# Patient Record
Sex: Female | Born: 1985
Health system: Southern US, Community
[De-identification: ages and names within clinical notes are randomized; demographics above are authoritative.]

## PROBLEM LIST (undated history)

## (undated) DIAGNOSIS — R519 Headache, unspecified: Secondary | ICD-10-CM

## (undated) DIAGNOSIS — R0602 Shortness of breath: Secondary | ICD-10-CM

## (undated) DIAGNOSIS — R200 Anesthesia of skin: Secondary | ICD-10-CM

## (undated) DIAGNOSIS — K589 Irritable bowel syndrome without diarrhea: Secondary | ICD-10-CM

## (undated) DIAGNOSIS — R600 Localized edema: Secondary | ICD-10-CM

## (undated) DIAGNOSIS — G43909 Migraine, unspecified, not intractable, without status migrainosus: Secondary | ICD-10-CM

## (undated) DIAGNOSIS — R079 Chest pain, unspecified: Secondary | ICD-10-CM

## (undated) DIAGNOSIS — K219 Gastro-esophageal reflux disease without esophagitis: Secondary | ICD-10-CM

## (undated) DIAGNOSIS — J45909 Unspecified asthma, uncomplicated: Secondary | ICD-10-CM

## (undated) DIAGNOSIS — M549 Dorsalgia, unspecified: Secondary | ICD-10-CM

## (undated) DIAGNOSIS — I10 Essential (primary) hypertension: Secondary | ICD-10-CM

## (undated) DIAGNOSIS — E669 Obesity, unspecified: Secondary | ICD-10-CM

## (undated) DIAGNOSIS — M2550 Pain in unspecified joint: Secondary | ICD-10-CM

## (undated) DIAGNOSIS — R7303 Prediabetes: Secondary | ICD-10-CM

## (undated) HISTORY — DX: Shortness of breath: R06.02

## (undated) HISTORY — DX: Dorsalgia, unspecified: M54.9

## (undated) HISTORY — PX: MOUTH SURGERY: SHX715

## (undated) HISTORY — DX: Headache, unspecified: R51.9

## (undated) HISTORY — DX: Migraine, unspecified, not intractable, without status migrainosus: G43.909

## (undated) HISTORY — DX: Localized edema: R60.0

## (undated) HISTORY — DX: Chest pain, unspecified: R07.9

## (undated) HISTORY — DX: Gastro-esophageal reflux disease without esophagitis: K21.9

## (undated) HISTORY — DX: Anesthesia of skin: R20.0

## (undated) HISTORY — DX: Pain in unspecified joint: M25.50

## (undated) HISTORY — DX: Unspecified asthma, uncomplicated: J45.909

## (undated) HISTORY — DX: Essential (primary) hypertension: I10

---

## 2011-05-18 ENCOUNTER — Other Ambulatory Visit: Payer: Self-pay

## 2011-05-18 ENCOUNTER — Encounter: Payer: Self-pay | Admitting: *Deleted

## 2011-05-18 ENCOUNTER — Emergency Department (HOSPITAL_COMMUNITY)
Admission: EM | Admit: 2011-05-18 | Discharge: 2011-05-19 | Disposition: A | Discharge status: Home / Self Care | Payer: Self-pay | Attending: Emergency Medicine | Admitting: Emergency Medicine

## 2011-05-18 DIAGNOSIS — J40 Bronchitis, not specified as acute or chronic: Secondary | ICD-10-CM | POA: Insufficient documentation

## 2011-05-18 DIAGNOSIS — R0789 Other chest pain: Secondary | ICD-10-CM | POA: Insufficient documentation

## 2011-05-18 DIAGNOSIS — J45909 Unspecified asthma, uncomplicated: Secondary | ICD-10-CM | POA: Insufficient documentation

## 2011-05-18 DIAGNOSIS — R059 Cough, unspecified: Secondary | ICD-10-CM | POA: Insufficient documentation

## 2011-05-18 DIAGNOSIS — R05 Cough: Secondary | ICD-10-CM | POA: Insufficient documentation

## 2011-05-18 NOTE — ED Notes (Signed)
The pt has had mid-upper chest pain  Since this am when she woke up.  She has a history of asthma and she is a smoker..  Coughing up mucous intermittently

## 2011-05-19 ENCOUNTER — Emergency Department (HOSPITAL_COMMUNITY): Payer: Self-pay

## 2011-05-19 MED ORDER — IBUPROFEN 800 MG PO TABS
800.0000 mg | ORAL_TABLET | Freq: Once | ORAL | Status: AC
  Administered 2011-05-19: 800 mg via ORAL
  Filled 2011-05-19: qty 1

## 2011-05-19 MED ORDER — IBUPROFEN 800 MG PO TABS: 800.0000 mg | ORAL_TABLET | Freq: Three times a day (TID) | ORAL | Status: AC

## 2011-05-19 MED ORDER — PREDNISONE 20 MG PO TABS
50.0000 mg | ORAL_TABLET | Freq: Once | ORAL | Status: AC
  Administered 2011-05-19: 50 mg via ORAL
  Filled 2011-05-19: qty 3

## 2011-05-19 MED ORDER — ALBUTEROL SULFATE HFA 108 (90 BASE) MCG/ACT IN AERS
2.0000 | INHALATION_SPRAY | Freq: Four times a day (QID) | RESPIRATORY_TRACT | Status: DC
  Administered 2011-05-19: 2 via RESPIRATORY_TRACT
  Filled 2011-05-19: qty 6.7

## 2011-05-19 NOTE — ED Provider Notes (Addendum)
History     CSN: [id number] Arrival date & time: 05/18/2011  9:55 PM   None     Chief Complaint  Patient presents with  . Chest Pain   Pleasant 25 year old female with a known history of childhood asthma. She, states she's been having pain in her upper chest, mostly with coughing or with certain positions and movement of her upper torso. She states she is "wheezing a lot." But does not have an inhaler. She has had no leg pain or swelling. Denies any fevers. Her cough is mostly nonproductive. She has had no pleuritic pain. No pain with exertion. She does not take birth control pills. Patient did not take any medications at home. Other than Excedrin. No history of cardiac disease or any strong family history of cardiac disease (Consider location/radiation/quality/duration/timing/severity/associated sxs/prior treatment) HPI  Past Medical History  Diagnosis Date  . Asthma     History reviewed. No pertinent past surgical history.  History reviewed. No pertinent family history.  History  Substance Use Topics  . Smoking status: Current Everyday Smoker  . Smokeless tobacco: Not on file  . Alcohol Use: No    OB History    Grav Para Term Preterm Abortions TAB SAB Ect Mult Living                  Review of Systems  All other systems reviewed and are negative.    Allergies  Review of patient's allergies indicates no known allergies.  Home Medications   Current Outpatient Rx  Name Route Sig Dispense Refill  . ASPIRIN-ACETAMINOPHEN-CAFFEINE 250-250-65 MG PO TABS Oral Take 1 tablet by mouth every 6 (six) hours as needed. For migraine       BP 122/77  Pulse 85  Temp(Src) 98.2 F (36.8 C) (Oral)  Resp 24  SpO2 100%  LMP 05/01/2011  Physical Exam  Constitutional: She is oriented to person, place, and time. She appears well-developed and well-nourished.  HENT:  Head: Normocephalic and atraumatic.  Eyes: Conjunctivae and EOM are normal. Pupils are equal, round, and  reactive to light.  Neck: Neck supple.  Cardiovascular: Normal rate and regular rhythm.  Exam reveals no gallop and no friction rub.   No murmur heard. Pulmonary/Chest: Breath sounds normal. She has no wheezes. She has no rales. She exhibits no tenderness.       Patient has diffuse reproducible upper chest wall tenderness. No redness or swelling. No crepitus.  Abdominal: Soft. Bowel sounds are normal. She exhibits no distension. There is no tenderness. There is no rebound and no guarding.  Musculoskeletal: Normal range of motion.  Neurological: She is alert and oriented to person, place, and time. No cranial nerve deficit. Coordination normal.  Skin: Skin is warm and dry. No rash noted.  Psychiatric: She has a normal mood and affect.    ED Course  Procedures (including critical care time)  Labs Reviewed - No data to display No results found.   No diagnosis found.    MDM  Patient is not in room, currently in x-ray. Will recheck momentarily       Jacqueline Delapena A. Patrica Duel, MD 05/19/11 0016  No results found for this or any previous visit. Dg Chest 2 View  05/19/2011  *RADIOLOGY REPORT*  Clinical Data: 25 year old female with chest pain.  CHEST - 2 VIEW  Comparison: None.  Findings: Somewhat shallow lung volumes.  Cardiac size and mediastinal contours are within normal limits.  Visualized tracheal air column is within normal limits.  No  pneumothorax, pleural effusion or confluent pulmonary opacity.  Questionable increased interstitial markings diffusely. No osseous abnormality identified. Negative visualized bowel gas pattern.  IMPRESSION: Negative except for suggestion of mild increased interstitial markings diffusely, probably artifact due to technique in the absence of symptoms of respiratory infection.  Original Report Authenticated By: Harley Hallmark, M.D.      Raed Schalk A. Patrica Duel, MD 05/19/11 0130

## 2011-05-19 NOTE — ED Notes (Signed)
Pt to ED c/o chest pain r/t cough.  Pt states sternal pain after coughing since yesterday.  Lung sounds clear bil.

## 2012-03-19 ENCOUNTER — Emergency Department (INDEPENDENT_AMBULATORY_CARE_PROVIDER_SITE_OTHER)
Admission: EM | Admit: 2012-03-19 | Discharge: 2012-03-19 | Disposition: A | Discharge status: Home / Self Care | Payer: Self-pay | Source: Home / Self Care | Attending: Family Medicine | Admitting: Family Medicine

## 2012-03-19 ENCOUNTER — Encounter (HOSPITAL_COMMUNITY): Payer: Self-pay | Admitting: Emergency Medicine

## 2012-03-19 DIAGNOSIS — R200 Anesthesia of skin: Secondary | ICD-10-CM

## 2012-03-19 DIAGNOSIS — R209 Unspecified disturbances of skin sensation: Secondary | ICD-10-CM

## 2012-03-19 NOTE — ED Provider Notes (Signed)
History     CSN: [id number]  Arrival date & time 03/19/12  1704   None     Chief Complaint  Patient presents with  . Numbness    (Consider location/radiation/quality/duration/timing/severity/associated sxs/prior treatment) The history is provided by the patient.  Patient reports known history of migraine headaches for which she takes OTC excedrin with resolution of symptoms.  Migraines are described as bilateral to unilateral triggered by smell.  States has not seek medical attention for symptoms in past because they are controlled at home.  Today presents for persistent left facial numbness noted after awakening 5 mornings ago.  Unknown if migraine symptoms prior to going to bed the previous night.  Denies associated migraines since facial numbness onset.  No other associated symptoms, no change in vision, no muscle weakness, no change in ability to swallow.  Denies history of same.    Past Medical History  Diagnosis Date  . Asthma     History reviewed. No pertinent past surgical history.  No family history on file.  History  Substance Use Topics  . Smoking status: Current Every Day Smoker  . Smokeless tobacco: Not on file  . Alcohol Use: Yes    OB History    Grav Para Term Preterm Abortions TAB SAB Ect Mult Living                  Review of Systems  Constitutional: Negative.   HENT: Positive for sinus pressure. Negative for facial swelling, mouth sores, trouble swallowing, neck pain and dental problem.   Respiratory: Negative.   Cardiovascular: Negative.   Skin: Negative.   Neurological: Positive for numbness and headaches. Negative for dizziness, tremors, seizures, syncope, facial asymmetry, speech difficulty, weakness and light-headedness.  Psychiatric/Behavioral: Negative.     Allergies  Review of patient's allergies indicates no known allergies.  Home Medications   Current Outpatient Rx  Name Route Sig Dispense Refill  . ASPIRIN-ACETAMINOPHEN-CAFFEINE  250-250-65 MG PO TABS Oral Take 1 tablet by mouth every 6 (six) hours as needed. For migraine       BP 117/79  Pulse 66  Temp 98.4 F (36.9 C) (Oral)  Resp 12  SpO2 98%  LMP 02/17/2012  Physical Exam  Nursing note and vitals reviewed. Constitutional: She is oriented to person, place, and time. Vital signs are normal. She appears well-developed and well-nourished. She is active and cooperative.  HENT:  Head: Normocephalic.    Right Ear: Hearing, tympanic membrane, external ear and ear canal normal.  Left Ear: Hearing, tympanic membrane, external ear and ear canal normal.  Nose: Right sinus exhibits no maxillary sinus tenderness. Left sinus exhibits maxillary sinus tenderness.  Mouth/Throat: Uvula is midline, oropharynx is clear and moist and mucous membranes are normal.       Tenderness and decreased sensation.  Eyes: Conjunctivae normal and EOM are normal. Pupils are equal, round, and reactive to light. No scleral icterus.  Neck: Trachea normal, normal range of motion and full passive range of motion without pain. Neck supple. No hepatojugular reflux present. No spinous process tenderness and no muscular tenderness present. No Brudzinski's sign noted.  Cardiovascular: Normal rate, regular rhythm, normal heart sounds, intact distal pulses and normal pulses.   Pulmonary/Chest: Effort normal and breath sounds normal.  Neurological: She is alert and oriented to person, place, and time. She has normal strength and normal reflexes. No cranial nerve deficit or sensory deficit. Coordination and gait normal. GCS eye subscore is 4. GCS verbal subscore is 5. GCS  motor subscore is 6.       Equal hand grips, sensation intact distally.  Skin: Skin is warm and dry.  Psychiatric: She has a normal mood and affect. Her speech is normal and behavior is normal. Judgment and thought content normal. Cognition and memory are normal.    ED Course  Procedures (including critical care time)  Labs Reviewed  - No data to display No results found.   1. Left facial numbness       MDM  Discussed with Dr. Shela Commons. Kindl.  Patient instructed to follow up with Dr. Vela Prose or headache clinic this week for further evaluation and treatment.          Johnsie Kindred, NP 03/25/12 1055

## 2012-03-19 NOTE — ED Notes (Signed)
Reports facial numbness to left side of face.  Numbness involving upper lip, bordered by nose and eye.  Reports numbness for 5 days.  Denies uri symptoms.  Reports headaches "all the time" including today, but has not taken any medicine for this.

## 2012-03-29 NOTE — ED Provider Notes (Signed)
Medical screening examination/treatment/procedure(s) were performed by resident physician or non-physician practitioner and as supervising physician I was immediately available for consultation/collaboration.   Kayode Petion DOUGLAS MD.    Charnay Nazario D Cristal Howatt, MD 03/29/12 0828 

## 2013-08-08 ENCOUNTER — Other Ambulatory Visit (HOSPITAL_COMMUNITY): Payer: Self-pay | Admitting: *Deleted

## 2013-08-08 DIAGNOSIS — N6320 Unspecified lump in the left breast, unspecified quadrant: Secondary | ICD-10-CM

## 2013-08-12 ENCOUNTER — Ambulatory Visit (HOSPITAL_COMMUNITY)
Admission: RE | Admit: 2013-08-12 | Discharge: 2013-08-12 | Disposition: A | Discharge status: Home / Self Care | Payer: No Typology Code available for payment source | Source: Ambulatory Visit | Attending: Obstetrics and Gynecology | Admitting: Obstetrics and Gynecology

## 2013-08-12 ENCOUNTER — Encounter (INDEPENDENT_AMBULATORY_CARE_PROVIDER_SITE_OTHER): Payer: Self-pay

## 2013-08-12 ENCOUNTER — Encounter (HOSPITAL_COMMUNITY): Payer: Self-pay

## 2013-08-12 ENCOUNTER — Ambulatory Visit
Admission: RE | Admit: 2013-08-12 | Discharge: 2013-08-12 | Disposition: A | Discharge status: Home / Self Care | Payer: No Typology Code available for payment source | Source: Ambulatory Visit | Attending: Obstetrics and Gynecology | Admitting: Obstetrics and Gynecology

## 2013-08-12 VITALS — BP 114/78 | Temp 97.8°F | Ht 64.0 in | Wt 324.0 lb

## 2013-08-12 DIAGNOSIS — R2230 Localized swelling, mass and lump, unspecified upper limb: Secondary | ICD-10-CM

## 2013-08-12 DIAGNOSIS — N6320 Unspecified lump in the left breast, unspecified quadrant: Secondary | ICD-10-CM

## 2013-08-12 DIAGNOSIS — Z01419 Encounter for gynecological examination (general) (routine) without abnormal findings: Secondary | ICD-10-CM

## 2013-08-12 HISTORY — DX: Localized swelling, mass and lump, unspecified upper limb: R22.30

## 2013-08-12 NOTE — Patient Instructions (Signed)
Taught Lynnette CaffeyKendra Lofaro how to perform BSE and gave educational materials to take home. Let her know [facility] will cover Pap smears every 3 years unless has a history of abnormal Pap smears. [facility] for left breast ultrasound. Appointment scheduled for Tuesday, August 12, 2013 at 0945. Patient aware of appointment and will be there. Let patient know will follow up with her within the next couple weeks with results of Pap smear by phone. Smoking cessation discussed with patient. Lynnette CaffeyKendra Mccance verbalized understanding.  Soua Caltagirone, Kathaleen Maserhristine Poll, RN 8:22 AM

## 2013-08-12 NOTE — Progress Notes (Addendum)
Complaints of a left axillary lump x 2 weeks that patient states has decreased in size. Patient states was red and painful when touched.  Pap Smear:  Pap smear completed today. Patients last Pap smear was in 2008 at [facility] in [place] and normal per patient. Per patient has no history of an abnormal Pap smear. No Pap smear results in EPIC.  Physical exam: Breasts Breasts symmetrical. No skin abnormalities bilateral breasts. No nipple retraction bilateral breasts. No nipple discharge bilateral breasts. No lymphadenopathy. No lumps palpated bilateral breasts. Palpated a small pea sized lump within the center of the left axilla. Patient complained of tenderness when palpated lump within the left axilla. [facility] for left breast ultrasound. Appointment scheduled for Tuesday, August 12, 2013 at 0945.          Pelvic/Bimanual   Ext Genitalia No lesions, no swelling and no discharge observed on external genitalia.         Vagina Vagina pink and normal texture. No lesions or discharge observed in vagina.          Cervix Cervix is present. Cervix pink and of normal texture. Cervix friable. No discharge observed.     Uterus Uterus is present and palpable. Uterus in normal position and normal size.        Adnexae Bilateral ovaries present and palpable. No tenderness on palpation.          Rectovaginal No rectal exam completed today since patient had no rectal complaints. No skin abnormalities observed on exam.

## 2013-09-03 ENCOUNTER — Telehealth (HOSPITAL_COMMUNITY): Payer: Self-pay | Admitting: *Deleted

## 2013-09-03 NOTE — Telephone Encounter (Signed)
Telephoned patient at home # and discussed negative pap smear results. Next pap smear due in 3 years. Patient voiced understanding.  

## 2014-11-23 ENCOUNTER — Encounter (HOSPITAL_COMMUNITY): Payer: Self-pay | Admitting: *Deleted

## 2014-11-23 ENCOUNTER — Emergency Department (HOSPITAL_COMMUNITY)
Admission: EM | Admit: 2014-11-23 | Discharge: 2014-11-23 | Disposition: A | Discharge status: Home / Self Care | Payer: Self-pay | Attending: Emergency Medicine | Admitting: Emergency Medicine

## 2014-11-23 DIAGNOSIS — G5601 Carpal tunnel syndrome, right upper limb: Secondary | ICD-10-CM

## 2014-11-23 DIAGNOSIS — J45909 Unspecified asthma, uncomplicated: Secondary | ICD-10-CM | POA: Insufficient documentation

## 2014-11-23 DIAGNOSIS — R51 Headache: Secondary | ICD-10-CM | POA: Insufficient documentation

## 2014-11-23 DIAGNOSIS — K1379 Other lesions of oral mucosa: Secondary | ICD-10-CM | POA: Insufficient documentation

## 2014-11-23 DIAGNOSIS — L309 Dermatitis, unspecified: Secondary | ICD-10-CM

## 2014-11-23 DIAGNOSIS — K1370 Unspecified lesions of oral mucosa: Secondary | ICD-10-CM

## 2014-11-23 DIAGNOSIS — Z720 Tobacco use: Secondary | ICD-10-CM | POA: Insufficient documentation

## 2014-11-23 MED ORDER — HYDROCORTISONE 2.5 % EX LOTN
TOPICAL_LOTION | Freq: Two times a day (BID) | CUTANEOUS | Status: DC
Start: 2014-11-23 — End: 2015-08-24

## 2014-11-23 MED ORDER — IBUPROFEN 800 MG PO TABS: 800.0000 mg | ORAL_TABLET | Freq: Three times a day (TID) | ORAL | Status: DC

## 2014-11-23 NOTE — ED Provider Notes (Signed)
CSN: [id number]     Arrival date & time 11/23/14  4098 History  This chart was scribed for non-physician practitioner, Santiago Glad, PA-C, working with Richardean Canal, MD, by Ronney Lion, ED Scribe. This patient was seen in room TR06C/TR06C and the patient's care was started at 9:20 AM.    Chief Complaint  Patient presents with  . Rash    RT hand  . Mouth Lesions   The history is provided by the patient. No language interpreter was used.     HPI Comments: Jacqueline Collins is a 29 y.o. female who presents to the Emergency Department complaining of a pruritic rash on her right hand that has been intermittent for 4 months. The rash started after patient had cut her right hand on a nail at work and had applied peroxide and alcohol on the cut, which healed without issue.  She also complains of right wrist "stiffness" and numbness through her right fingers. She had used hand sanitizer, which aggravated the rash and made it burn. Nothing makes it better. She denies fever, chills, headache, or SOB.   Patient also complains of a bump on the left inside of her mouth that has been present for a year. She has not yet had the bump evaluated or treated it. Nothing makes it better and nothing makes it worse. She states she is going to the dentist soon and will have it checked out. She denies smokeless tobacco use. She states she had "pulled out" the bump at one point and "busted it," but it soon returned.  She does report that she currently smokes cigarettes, but denies chewing tobacco.   Past Medical History  Diagnosis Date  . Asthma    History reviewed. No pertinent past surgical history. Family History  Problem Relation Age of Onset  . Hypertension Mother   . Diabetes Mother   . Breast cancer Maternal Aunt    History  Substance Use Topics  . Smoking status: Current Every Day Smoker -- 0.25 packs/day for 12 years  . Smokeless tobacco: Not on file  . Alcohol Use: No   OB History    Gravida Para Term  Preterm AB TAB SAB Ectopic Multiple Living   0              Review of Systems  Constitutional: Negative for fever and chills.  HENT: Positive for mouth sores.   Respiratory: Negative for shortness of breath.   Skin: Positive for rash.  Neurological: Positive for headaches.    Allergies  Review of patient's allergies indicates no known allergies.  Home Medications   Prior to Admission medications   Medication Sig Start Date End Date Taking? Authorizing Provider  aspirin-acetaminophen-caffeine (EXCEDRIN MIGRAINE) [phone] MG per tablet Take 1 tablet by mouth every 6 (six) hours as needed. For migraine     Historical Provider, MD   BP 120/107 mmHg  Pulse 76  Temp(Src) 97.8 F (36.6 C) (Oral)  Resp 18  SpO2 100%  LMP 10/24/2014 Physical Exam  Constitutional: She is oriented to person, place, and time. She appears well-developed and well-nourished. No distress.  HENT:  Head: Normocephalic and atraumatic.  Approximately 3 mm soft raised circular lesion of the left lower gingiva.  No fluctuance.  No drainage.  Eyes: Conjunctivae and EOM are normal.  Neck: Neck supple. No tracheal deviation present.  Cardiovascular: Normal rate, regular rhythm and normal heart sounds.   Pulmonary/Chest: Effort normal and breath sounds normal.  Musculoskeletal: Normal range of motion.  Positive Phalen's. Positive Tinel's sign.   Neurological: She is alert and oriented to person, place, and time.  Skin: Skin is warm and dry. Rash noted.  Small 1 cm area of faint papules located on the dorsal aspect of the right hand just proximal to the second MCP. No drainage.  Psychiatric: She has a normal mood and affect. Her behavior is normal.  Nursing note and vitals reviewed.   ED Course  Procedures (including critical care time)  DIAGNOSTIC STUDIES: Oxygen Saturation is 100% on RA, normal by my interpretation.    COORDINATION OF CARE: 9:31 AM - Suspect contact dermatitis and carpal tunnel.  Discussed treatment plan with pt at bedside which includes steroid cream, wrist splint and anti-inflammatory medication, and referral to oral surgeon; pt agreed to plan.    Meds ordered this encounter  Medications  . ibuprofen (ADVIL,MOTRIN) 800 MG tablet    Sig: Take 1 tablet (800 mg total) by mouth 3 (three) times daily.    Dispense:  21 tablet    Refill:  0    Order Specific Question:  Supervising Provider    Answer:  MILLER, BRIAN [[id number]]  . hydrocortisone 2.5 % lotion    Sig: Apply topically 2 (two) times daily.    Dispense:  59 mL    Refill:  0    Order Specific Question:  Supervising Provider    Answer:  Eber HongMILLER, BRIAN [[id number]]     MDM   Final diagnoses:  None  Patient presents today with a rash on the hand that has been intermittent for the past 4 months.  Patient instructed to apply Hydrocortisone to the area.  No signs of Anaphylaxis.  Patient also complaining of an oral lesion that has been present for the past year.  No signs of dental abscess or infection.  Patient given referral to Oral Surgery regarding this.  Patient also with pain of her wrist with intermittent numbness/tingling of her fingers.  Symptoms most consistent with Carpal Tunnel.  Patient given a wrist splint.  Stable for discharge.  Return precautions given.  I personally performed the services described in this documentation, which was scribed in my presence. The recorded information has been reviewed and is accurate.     Santiago GladHeather Cohl Behrens, PA-C 11/23/14 1353  Richardean Canalavid H Yao, MD 11/23/14 [phone]

## 2014-11-23 NOTE — ED Notes (Signed)
Pt reports one month ago she hit Rt hand on a nail and has had a rash on RT hand since. Pt also reports a bump on the LT inside of mouth.

## 2014-11-23 NOTE — ED Notes (Signed)
Declined W/C at D/C and was escorted to lobby by RN. 

## 2015-06-14 ENCOUNTER — Emergency Department (HOSPITAL_COMMUNITY)
Admission: EM | Admit: 2015-06-14 | Discharge: 2015-06-14 | Disposition: A | Discharge status: Home / Self Care | Payer: Self-pay | Attending: Emergency Medicine | Admitting: Emergency Medicine

## 2015-06-14 ENCOUNTER — Encounter (HOSPITAL_COMMUNITY): Payer: Self-pay | Admitting: Emergency Medicine

## 2015-06-14 ENCOUNTER — Emergency Department (HOSPITAL_COMMUNITY): Payer: Self-pay

## 2015-06-14 DIAGNOSIS — M25531 Pain in right wrist: Secondary | ICD-10-CM | POA: Insufficient documentation

## 2015-06-14 DIAGNOSIS — M79644 Pain in right finger(s): Secondary | ICD-10-CM | POA: Insufficient documentation

## 2015-06-14 DIAGNOSIS — R200 Anesthesia of skin: Secondary | ICD-10-CM | POA: Insufficient documentation

## 2015-06-14 DIAGNOSIS — E669 Obesity, unspecified: Secondary | ICD-10-CM | POA: Insufficient documentation

## 2015-06-14 DIAGNOSIS — Z23 Encounter for immunization: Secondary | ICD-10-CM | POA: Insufficient documentation

## 2015-06-14 DIAGNOSIS — F17200 Nicotine dependence, unspecified, uncomplicated: Secondary | ICD-10-CM | POA: Insufficient documentation

## 2015-06-14 DIAGNOSIS — J45909 Unspecified asthma, uncomplicated: Secondary | ICD-10-CM | POA: Insufficient documentation

## 2015-06-14 DIAGNOSIS — Z7982 Long term (current) use of aspirin: Secondary | ICD-10-CM | POA: Insufficient documentation

## 2015-06-14 DIAGNOSIS — M79645 Pain in left finger(s): Secondary | ICD-10-CM | POA: Insufficient documentation

## 2015-06-14 DIAGNOSIS — M25532 Pain in left wrist: Secondary | ICD-10-CM | POA: Insufficient documentation

## 2015-06-14 HISTORY — DX: Obesity, unspecified: E66.9

## 2015-06-14 MED ORDER — POLYETHYLENE GLYCOL 3350 17 G PO PACK
17.0000 g | PACK | Freq: Once | ORAL | Status: DC
  Filled 2015-06-14: qty 1

## 2015-06-14 MED ORDER — TETANUS-DIPHTH-ACELL PERTUSSIS 5-2.5-18.5 LF-MCG/0.5 IM SUSP
0.5000 mL | Freq: Once | INTRAMUSCULAR | Status: AC
  Administered 2015-06-14: 0.5 mL via INTRAMUSCULAR
  Filled 2015-06-14: qty 0.5

## 2015-06-14 MED ORDER — NAPROXEN 250 MG PO TABS
500.0000 mg | ORAL_TABLET | Freq: Once | ORAL | Status: AC
  Administered 2015-06-14: 500 mg via ORAL
  Filled 2015-06-14: qty 2

## 2015-06-14 MED ORDER — NAPROXEN 500 MG PO TABS: 500.0000 mg | ORAL_TABLET | Freq: Two times a day (BID) | ORAL | Status: DC

## 2015-06-14 NOTE — ED Provider Notes (Signed)
CSN: [id number]     Arrival date & time 06/14/15  2058 History  By signing my name below, I, Jacqueline Collins, attest that this documentation has been prepared under the direction and in the presence of [employer], PA-C. Electronically Signed: Tanda Collins, ED Scribe. 06/14/2015. 10:10 PM.     No chief complaint on file.  The history is provided by the patient. No language interpreter was used.     HPI Comments: Jacqueline Collins is a 29 y.o. female who is right hand dominant presents to the Emergency Department complaining of gradual onset, constant, sharp, right thumb and index finger pain and numbness x 1 week. Pt also complains of bilateral wrist pain, worse on left. Pt works at [employer] on an [employer] and does a lot of repetitive work. The pain is worse after waking up. She reports that the pain is mildly alleviated with popping of her fingers. Denies fever, chills, neck pain, or any other associated symptoms. Pt is also requesting a tetanus shot at this time her previous cut. She reports that she cut her middle finger on her right hand about 1 month ago. Pt was seen at that time but did not receive a tetanus and would like one.   Past Medical History  Diagnosis Date  . Asthma   . Obesity    History reviewed. No pertinent past surgical history. Family History  Problem Relation Age of Onset  . Hypertension Mother   . Diabetes Mother   . Breast cancer Maternal Aunt    Social History  Substance Use Topics  . Smoking status: Current Every Day Smoker -- 0.25 packs/day for 12 years  . Smokeless tobacco: None  . Alcohol Use: No   OB History    Gravida Para Term Preterm AB TAB SAB Ectopic Multiple Living   0              Review of Systems  Constitutional: Negative for fever and chills.  Musculoskeletal: Positive for arthralgias (Bilateral index and thumb pain. Bilateral wrist pain. ). Negative for neck pain.  Neurological: Positive for numbness.    Allergies  Review of  patient's allergies indicates no known allergies.  Home Medications   Prior to Admission medications   Medication Sig Start Date End Date Taking? Authorizing Provider  aspirin-acetaminophen-caffeine (EXCEDRIN MIGRAINE) [phone] MG per tablet Take 1 tablet by mouth every 6 (six) hours as needed. For migraine    Yes Historical Provider, MD  hydrocortisone 2.5 % lotion Apply topically 2 (two) times daily. Patient not taking: Reported on 06/14/2015 11/23/14   Santiago Glad, PA-C  ibuprofen (ADVIL,MOTRIN) 800 MG tablet Take 1 tablet (800 mg total) by mouth 3 (three) times daily. Patient not taking: Reported on 06/14/2015 11/23/14   Santiago Glad, PA-C  naproxen (NAPROSYN) 500 MG tablet Take 1 tablet (500 mg total) by mouth 2 (two) times daily with a meal. 06/14/15   Catha Gosselin, PA-C   Triage Vitals: BP 133/91 mmHg  Pulse 80  Temp(Src) 97.6 F (36.4 C) (Oral)  Resp 18  Ht  (1.626 m)  Wt 274 lb 9 oz (124.541 kg)  BMI 47.11 kg/m2  SpO2 99%  LMP 05/30/2015 (Approximate)   Physical Exam  Constitutional: She is oriented to person, place, and time. She appears well-developed and well-nourished. No distress.  HENT:  Head: Normocephalic and atraumatic.  Eyes: Conjunctivae and EOM are normal.  Neck: Neck supple. No tracheal deviation present.  Cardiovascular: Normal rate.   Pulmonary/Chest: Effort normal. No  respiratory distress.  Musculoskeletal: Normal range of motion.  Scar along the MCP of the middle finger on right hand Able to flex and extend finger 2+ radial pulse Numbness along the bilateral thumb and partial first finger < 2 seconds capillary refill  No significant swelling or joint effusion Positive Phalen's test Negative Tinel's sign   Neurological: She is alert and oriented to person, place, and time.  Skin: Skin is warm and dry.  Psychiatric: She has a normal mood and affect. Her behavior is normal.  Nursing note and vitals reviewed.   ED Course   Procedures (including critical care time)  DIAGNOSTIC STUDIES: Oxygen Saturation is 99% on RA, normal by my interpretation.    COORDINATION OF CARE: 10:08 PM-Discussed treatment plan which includes DG L Wrist with pt at bedside and pt agreed to plan.   Labs Review Labs Reviewed - No data to display  Imaging Review Dg Wrist Complete Left  06/14/2015  CLINICAL DATA:  Pain at the base of the first and second digit. Pain with flexion for 2 weeks. No injury. EXAM: LEFT WRIST - COMPLETE 3+ VIEW COMPARISON:  None. FINDINGS: There is no evidence of fracture or dislocation. There is no evidence of arthropathy or other focal bone abnormality. Soft tissues are unremarkable. IMPRESSION: Negative. Electronically Signed   By: Burman NievesWilliam  Stevens M.D.   On: 06/14/2015 22:47   I have personally reviewed and evaluated these images as part of my medical decision-making.   EKG Interpretation None      MDM   Final diagnoses:  Wrist pain, acute, left  Pt presents to the ED with bilateral index finger and thumb pain and numbness and bilateral wrist pain, worse on left x 1 week. The location of her pain is most likely a radial nerve pain and is probably from repetitive motion at her job. X-ray of the left wrist is negative for acute fracture or dislocation. She has a good pulse to the hand and no pallor. I do not believe this is septic joint. I discussed that she would need to follow up with hand surgery and was given naproxen for pain. She was also given a left wrist splint for comfort during the time that she is sleeping and when she can wear at work during the day. She verbally agrees with the plan.  I personally performed the services described in this documentation, which was scribed in my presence. The recorded information has been reviewed and is accurate.      Catha GosselinHanna Patel-Mills, PA-C 06/14/15 [id number]  Gwyneth SproutWhitney Plunkett, MD 06/16/15 2141

## 2015-06-14 NOTE — ED Notes (Signed)
Pt. requesting tetanus injection , stated right middle finger wound that has been healed / denies open wounds . Pt. also stated stiff fingers.

## 2015-06-14 NOTE — Discharge Instructions (Signed)
Wrist Pain Follow up with a hand surgeon. Take naproxen for pain. There are many things that can cause wrist pain. Some common causes include:  An injury to the wrist area, such as a sprain, strain, or fracture.  Overuse of the joint.  A condition that causes increased pressure on a nerve in the wrist (carpal tunnel syndrome).  Wear and tear of the joints that occurs with aging (osteoarthritis).  A variety of other types of arthritis. Sometimes, the cause of wrist pain is not known. The pain often goes away when you follow your health care provider's instructions for relieving pain at home. If your wrist pain continues, tests may need to be done to diagnose your condition. HOME CARE INSTRUCTIONS Pay attention to any changes in your symptoms. Take these actions to help with your pain:  Rest the wrist area for at least 48 hours or as told by your health care provider.  If directed, apply ice to the injured area:  Put ice in a plastic bag.  Place a towel between your skin and the bag.  Leave the ice on for 20 minutes, 2-3 times per day.  Keep your arm raised (elevated) above the level of your heart while you are sitting or lying down.  If a splint or elastic bandage has been applied, use it as told by your health care provider.  Remove the splint or bandage only as told by your health care provider.  Loosen the splint or bandage if your fingers become numb or have a tingling feeling, or if they turn cold or blue.  Take over-the-counter and prescription medicines only as told by your health care provider.  Keep all follow-up visits as told by your health care provider. This is important. SEEK MEDICAL CARE IF:  Your pain is not helped by treatment.  Your pain gets worse. SEEK IMMEDIATE MEDICAL CARE IF:  Your fingers become swollen.  Your fingers turn white, very red, or cold and blue.  Your fingers are numb or have a tingling feeling.  You have difficulty moving your  fingers.   This information is not intended to replace advice given to you by your health care provider. Make sure you discuss any questions you have with your health care provider.   Document Released: 03/29/2005 Document Revised: 03/10/2015 Document Reviewed: 11/04/2014 Elsevier Interactive Patient Education [employer]2016 Elsevier Inc.

## 2015-08-24 ENCOUNTER — Encounter (HOSPITAL_COMMUNITY): Payer: Self-pay

## 2015-08-24 ENCOUNTER — Emergency Department (HOSPITAL_COMMUNITY)
Admission: EM | Admit: 2015-08-24 | Discharge: 2015-08-24 | Disposition: A | Discharge status: Home / Self Care | Payer: Self-pay | Attending: Emergency Medicine | Admitting: Emergency Medicine

## 2015-08-24 ENCOUNTER — Emergency Department (HOSPITAL_COMMUNITY): Payer: Self-pay

## 2015-08-24 DIAGNOSIS — E669 Obesity, unspecified: Secondary | ICD-10-CM | POA: Insufficient documentation

## 2015-08-24 DIAGNOSIS — J45901 Unspecified asthma with (acute) exacerbation: Secondary | ICD-10-CM | POA: Insufficient documentation

## 2015-08-24 DIAGNOSIS — J069 Acute upper respiratory infection, unspecified: Secondary | ICD-10-CM | POA: Insufficient documentation

## 2015-08-24 DIAGNOSIS — F17200 Nicotine dependence, unspecified, uncomplicated: Secondary | ICD-10-CM | POA: Insufficient documentation

## 2015-08-24 DIAGNOSIS — Y9289 Other specified places as the place of occurrence of the external cause: Secondary | ICD-10-CM | POA: Insufficient documentation

## 2015-08-24 DIAGNOSIS — Z79899 Other long term (current) drug therapy: Secondary | ICD-10-CM | POA: Insufficient documentation

## 2015-08-24 DIAGNOSIS — Y9389 Activity, other specified: Secondary | ICD-10-CM | POA: Insufficient documentation

## 2015-08-24 DIAGNOSIS — R110 Nausea: Secondary | ICD-10-CM | POA: Insufficient documentation

## 2015-08-24 DIAGNOSIS — Y998 Other external cause status: Secondary | ICD-10-CM | POA: Insufficient documentation

## 2015-08-24 DIAGNOSIS — R079 Chest pain, unspecified: Secondary | ICD-10-CM | POA: Insufficient documentation

## 2015-08-24 DIAGNOSIS — S61412A Laceration without foreign body of left hand, initial encounter: Secondary | ICD-10-CM | POA: Insufficient documentation

## 2015-08-24 DIAGNOSIS — Y288XXA Contact with other sharp object, undetermined intent, initial encounter: Secondary | ICD-10-CM | POA: Insufficient documentation

## 2015-08-24 LAB — BASIC METABOLIC PANEL
ANION GAP: 11 (ref 5–15)
BUN: 9 mg/dL (ref 6–20)
CALCIUM: 9.8 mg/dL (ref 8.9–10.3)
CO2: 26 mmol/L (ref 22–32)
Chloride: 104 mmol/L (ref 101–111)
Creatinine, Ser: 0.86 mg/dL (ref 0.44–1.00)
GFR calc Af Amer: >60 mL/min (ref >60)
GLUCOSE: 97 mg/dL (ref 65–99)
POTASSIUM: 3.8 mmol/L (ref 3.5–5.1)
SODIUM: 141 mmol/L (ref 135–145)

## 2015-08-24 LAB — CBC
HEMATOCRIT: 39.1 % (ref 36.0–46.0)
Hemoglobin: 12.9 g/dL (ref 12.0–15.0)
MCH: 31.8 pg (ref 26.0–34.0)
MCHC: 33 g/dL (ref 30.0–36.0)
MCV: 96.3 fL (ref 78.0–100.0)
Platelets: 359 10*3/uL (ref 150–400)
RBC: 4.06 MIL/uL (ref 3.87–5.11)
RDW: 13.9 % (ref 11.5–15.5)
WBC: 8 10*3/uL (ref 4.0–10.5)

## 2015-08-24 LAB — I-STAT TROPONIN, ED: TROPONIN I, POC: 0 ng/mL (ref 0.00–0.08)

## 2015-08-24 NOTE — ED Notes (Addendum)
Pt been c/o mid chest pain for about a week now. Pt states last week she was having tightness, now states it's heavy. Pt also has a cut on top of left hand she wants looked at.

## 2015-08-24 NOTE — ED Provider Notes (Signed)
CSN: [id number]     Arrival date & time 08/24/15  0234 History   First MD Initiated Contact with Patient 08/24/15 0701     Chief Complaint  Patient presents with  . Chest Pain     (Consider location/radiation/quality/duration/timing/severity/associated sxs/prior Treatment) HPI Comments: Cough, chills, SOB, chest pain Was sick for 2 weeks with cold, now cough improving  CP every other month before, lately has CP every other week Chest tightening, last night night lasted 2 minutes, last episode before this was 1 week ago Was at work Shortness of breath No change in wheezing Coughing up mucus Smoking cigarettes   Patient is a 30 y.o. female presenting with chest pain.  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 Asthma   . Obesity    History reviewed. No pertinent past surgical history. Family History  Problem Relation Age of Onset  . Hypertension Mother   . Diabetes Mother   . Breast cancer Maternal Aunt    Social History  Substance Use Topics  . Smoking status: Current Every Day Smoker -- 0.25 packs/day for 12 years  . Smokeless tobacco: None  . 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 Negative for fever.  HENT: Positive for congestion and sore throat (maybe a little bit).   Eyes: Negative for visual disturbance.  Respiratory: Positive for cough, shortness of breath and wheezing (baseline per pt).   Cardiovascular: Positive for chest pain.  Gastrointestinal: Positive for nausea. Negative for vomiting, abdominal pain and diarrhea.  Genitourinary: Negative for difficulty  urinating.  Musculoskeletal: Negative for back pain and neck pain.  Skin: Negative for rash.  Neurological: Negative for syncope and headaches.      Allergies  Review of patient's allergies indicates no known allergies.  Home Medications   Prior to Admission medications   Medication Sig Start Date End Date Taking? Authorizing Provider  aspirin-acetaminophen-caffeine (EXCEDRIN MIGRAINE) [phone] MG per tablet Take 1 tablet by mouth every 6 (six) hours as needed. For migraine    Yes Historical Provider, MD  ferrous sulfate 325 (65 FE) MG tablet Take 325 mg by mouth daily with breakfast.   Yes Historical Provider, MD   BP 106/77 mmHg  Pulse 70  Temp(Src) 97.7 F (36.5 C) (Oral)  Resp 16  Ht 5' 2.5" (1.588 m)  Wt 279 lb 3 oz (126.639 kg)  BMI 50.22 kg/m2  SpO2 100%  LMP 08/01/2015 (Approximate) Physical Exam  Constitutional: She is oriented to person, place, and time. She appears well-developed and well-nourished. No distress.  HENT:  Head: Normocephalic and atraumatic.  Eyes: Conjunctivae and EOM are normal.  Neck: Normal range of motion.  Cardiovascular: Normal rate, regular rhythm, normal heart sounds and intact distal pulses.  Exam reveals no gallop and no friction rub.   No murmur heard. Pulmonary/Chest: Effort normal and breath sounds normal. No respiratory distress. She has no wheezes. She has no rales.  Abdominal: Soft. She exhibits no distension. There is no tenderness. There is no guarding.  Musculoskeletal: She  exhibits no edema or tenderness.  Neurological: She is alert and oriented to person, place, and time.  Skin: Skin is warm and dry. No rash noted. She is not diaphoretic. No erythema.  2cm superficial lac to left hand, no surrounding erythema, no fluctuance  Nursing note and vitals reviewed.   ED Course  Procedures (including critical care time) Labs Review Labs Reviewed  BASIC METABOLIC PANEL  CBC  I-STAT TROPOININ, ED    Imaging Review Dg Chest  2 View  08/25/2015  CLINICAL DATA:  Chest pain for 1 week with productive cough and shortness of Breath EXAM: CHEST  2 VIEW COMPARISON:  08/24/2015 FINDINGS: Cardiac shadow is stable. Mild vascular congestion is again seen without significant edema. No focal infiltrate is noted. No bony abnormality is seen. IMPRESSION: Stable mild vascular congestion without focal infiltrate. Electronically Signed   By: Alcide Clever M.D.   On: 08/25/2015 19:36   Dg Chest 2 View  08/24/2015  CLINICAL DATA:  Chronic shortness of breath, chest heaviness and dizziness. Initial encounter. EXAM: CHEST  2 VIEW COMPARISON:  Chest radiograph performed 05/19/2011 FINDINGS: The lungs are well-aerated. Mild vascular congestion is noted. There is no evidence of focal opacification, pleural effusion or pneumothorax. The heart is normal in size; the mediastinal contour is within normal limits. No acute osseous abnormalities are seen. IMPRESSION: Mild vascular congestion noted.  Lungs remain grossly clear. Electronically Signed   By: Roanna Raider M.D.   On: 08/24/2015 03:20   I have personally reviewed and evaluated these images and lab results as part of my medical decision-making.   EKG Interpretation   Date/Time:  Tuesday August 24 2015 02:49:15 EST Ventricular Rate:  76 PR Interval:  160 QRS Duration: 72 QT Interval:  388 QTC Calculation: 436 R Axis:   26 Text Interpretation:  Normal sinus rhythm Normal ECG When compared with  ECG of 05/17/2001, No significant change was found Confirmed by [facility]  MD,  DAVID ([id number]) on 08/24/2015 4:53:29 AM Also confirmed by [facility] Fleeting  MD, DAVID  ([id number]), editor [place]  CCT, BEVERLY ([id number])  on 08/24/2015 8:18:37 AM      MDM   Final diagnoses:  Chest pain, unspecified chest pain type  URI (upper respiratory infection)  Hand laceration, left, initial encounter   31 year-old female with history of asthma presents with concern of chest pain, cough, dyspnea. Differential diagnosis  for chest pain includes pulmonary embolus, dissection, pneumothorax, pneumonia, ACS, myocarditis, pericarditis.  EKG was done and evaluate by me and showed no acute ST changes and no signs of pericarditis. Chest x-ray was done and evaluated by me and radiology and showed no sign of pneumonia or pneumothorax. Patient is PERC negative and low risk [place] and have low suspicion for PE.  Patient is low risk HEART score and troponin negative with history more consistent with viral URI. Given this evaluation, history and physical have low suspicion for pulmonary embolus, pneumonia, ACS, myocarditis, pericarditis, dissection. No wheezing on exam, good breath sounds, doubt asthma exacerbation. Recommend outpt follow up within one week.  Pt also has small lac to left hand, very superficial, no surrounding erythema or fluctuance, NV intact, healing.  Patient discharged in stable condition with understanding of reasons to return.      Alvira Monday, MD 08/25/15 2038

## 2015-08-25 ENCOUNTER — Encounter (HOSPITAL_COMMUNITY): Payer: Self-pay | Admitting: Emergency Medicine

## 2015-08-25 ENCOUNTER — Emergency Department (HOSPITAL_COMMUNITY): Payer: Self-pay

## 2015-08-25 DIAGNOSIS — F17200 Nicotine dependence, unspecified, uncomplicated: Secondary | ICD-10-CM | POA: Insufficient documentation

## 2015-08-25 DIAGNOSIS — J111 Influenza due to unidentified influenza virus with other respiratory manifestations: Secondary | ICD-10-CM | POA: Insufficient documentation

## 2015-08-25 DIAGNOSIS — J45901 Unspecified asthma with (acute) exacerbation: Secondary | ICD-10-CM | POA: Insufficient documentation

## 2015-08-25 DIAGNOSIS — M549 Dorsalgia, unspecified: Secondary | ICD-10-CM | POA: Insufficient documentation

## 2015-08-25 DIAGNOSIS — G5601 Carpal tunnel syndrome, right upper limb: Secondary | ICD-10-CM | POA: Insufficient documentation

## 2015-08-25 DIAGNOSIS — E669 Obesity, unspecified: Secondary | ICD-10-CM | POA: Insufficient documentation

## 2015-08-25 MED ORDER — ALBUTEROL SULFATE (2.5 MG/3ML) 0.083% IN NEBU
INHALATION_SOLUTION | RESPIRATORY_TRACT | Status: AC
  Administered 2015-08-25: 19:00:00
  Filled 2015-08-25: qty 6

## 2015-08-25 MED ORDER — ACETAMINOPHEN 325 MG PO TABS
ORAL_TABLET | ORAL | Status: AC
  Filled 2015-08-25: qty 2

## 2015-08-25 MED ORDER — ALBUTEROL SULFATE (2.5 MG/3ML) 0.083% IN NEBU
5.0000 mg | INHALATION_SOLUTION | Freq: Once | RESPIRATORY_TRACT | Status: AC
  Administered 2015-08-25: 5 mg via RESPIRATORY_TRACT

## 2015-08-25 MED ORDER — ACETAMINOPHEN 325 MG PO TABS
650.0000 mg | ORAL_TABLET | Freq: Once | ORAL | Status: AC | PRN
  Administered 2015-08-25: 650 mg via ORAL

## 2015-08-25 NOTE — ED Notes (Signed)
Pt from home for eval of sob and continued cough with yellow sputum production. Pt states was seen here for same but is getting worse. Pt noted to be febrile in triage all other VS stable. Wheezing noted in pt chest. Pt reports chest pain with cough.

## 2015-08-26 ENCOUNTER — Emergency Department (HOSPITAL_COMMUNITY)
Admission: EM | Admit: 2015-08-26 | Discharge: 2015-08-26 | Disposition: A | Discharge status: Home / Self Care | Payer: Self-pay | Attending: Emergency Medicine | Admitting: Emergency Medicine

## 2015-08-26 DIAGNOSIS — G5601 Carpal tunnel syndrome, right upper limb: Secondary | ICD-10-CM

## 2015-08-26 DIAGNOSIS — J111 Influenza due to unidentified influenza virus with other respiratory manifestations: Secondary | ICD-10-CM

## 2015-08-26 MED ORDER — PREDNISONE 20 MG PO TABS: 40.0000 mg | ORAL_TABLET | Freq: Every day | ORAL | Status: DC

## 2015-08-26 MED ORDER — OSELTAMIVIR PHOSPHATE 75 MG PO CAPS: 75.0000 mg | ORAL_CAPSULE | Freq: Two times a day (BID) | ORAL | Status: DC

## 2015-08-26 MED ORDER — ALBUTEROL SULFATE HFA 108 (90 BASE) MCG/ACT IN AERS
1.0000 | INHALATION_SPRAY | RESPIRATORY_TRACT | Status: DC | PRN
  Administered 2015-08-26: 2 via RESPIRATORY_TRACT
  Filled 2015-08-26: qty 6.7

## 2015-08-26 MED ORDER — OSELTAMIVIR PHOSPHATE 75 MG PO CAPS
75.0000 mg | ORAL_CAPSULE | Freq: Once | ORAL | Status: AC
  Administered 2015-08-26: 75 mg via ORAL
  Filled 2015-08-26: qty 1

## 2015-08-26 MED ORDER — IBUPROFEN 200 MG PO TABS
600.0000 mg | ORAL_TABLET | Freq: Once | ORAL | Status: AC
  Administered 2015-08-26: 600 mg via ORAL
  Filled 2015-08-26: qty 3

## 2015-08-26 MED ORDER — IBUPROFEN 600 MG PO TABS: 600.0000 mg | ORAL_TABLET | Freq: Four times a day (QID) | ORAL | Status: DC | PRN

## 2015-08-26 NOTE — ED Notes (Signed)
Pt stating she won't take steroid RX. RN reeducated. RN also educated about taking blankets off when she has fever.

## 2015-08-26 NOTE — Discharge Instructions (Signed)
Influenza, Adult °Influenza ("the flu") is a viral infection of the respiratory tract. It occurs more often in winter months because people spend more time in close contact with one another. Influenza can make you feel very sick. Influenza easily spreads from person to person (contagious). °CAUSES  °Influenza is caused by a virus that infects the respiratory tract. You can catch the virus by breathing in droplets from an infected person's cough or sneeze. You can also catch the virus by touching something that was recently contaminated with the virus and then touching your mouth, nose, or eyes. °RISKS AND COMPLICATIONS °You may be at risk for a more severe case of influenza if you smoke cigarettes, have diabetes, have chronic heart disease (such as heart failure) or lung disease (such as asthma), or if you have a weakened immune system. Elderly people and pregnant women are also at risk for more serious infections. The most common problem of influenza is a lung infection (pneumonia). Sometimes, this problem can require emergency medical care and may be life threatening. °SIGNS AND SYMPTOMS  °Symptoms typically last 4 to 10 days and may include: °· Fever. °· Chills. °· Headache, body aches, and muscle aches. °· Sore throat. °· Chest discomfort and cough. °· Poor appetite. °· Weakness or feeling tired. °· Dizziness. °· Nausea or vomiting. °DIAGNOSIS  °Diagnosis of influenza is often made based on your history and a physical exam. A nose or throat swab test can be done to confirm the diagnosis. °TREATMENT  °In mild cases, influenza goes away on its own. Treatment is directed at relieving symptoms. For more severe cases, your health care provider may prescribe antiviral medicines to shorten the sickness. Antibiotic medicines are not effective because the infection is caused by a virus, not by bacteria. °HOME CARE INSTRUCTIONS °· Take medicines only as directed by your health care provider. °· Use a cool mist humidifier  to make breathing easier. °· Get plenty of rest until your temperature returns to normal. This usually takes 3 to 4 days. °· Drink enough fluid to keep your urine clear or pale yellow. °· Cover your mouth and nose when coughing or sneezing, and wash your hands well to prevent the virus from spreading. °· Stay home from work or school until the fever is gone for at least 1 full day. °PREVENTION  °An annual influenza vaccination (flu shot) is the best way to avoid getting influenza. An annual flu shot is now routinely recommended for all adults in the [place] °SEEK MEDICAL CARE IF: °· You experience chest pain, your cough worsens, or you produce more mucus. °· You have nausea, vomiting, or diarrhea. °· Your fever returns or gets worse. °SEEK IMMEDIATE MEDICAL CARE IF: °· You have trouble breathing, you become short of breath, or your skin or nails become bluish. °· You have severe pain or stiffness in the neck. °· You develop a sudden headache, or pain in the face or ear. °· You have nausea or vomiting that you cannot control. °MAKE SURE YOU:  °· Understand these instructions. °· Will watch your condition. °· Will get help right away if you are not doing well or get worse. °  °This information is not intended to replace advice given to you by your health care provider. Make sure you discuss any questions you have with your health care provider. °  °Document Released: 06/16/2000 Document Revised: 07/10/2014 Document Reviewed: 09/18/2011 °Elsevier Interactive Patient Education ©2016 Elsevier Inc. ° °Upper Respiratory Infection, Adult °Most upper respiratory infections (URIs) are a viral infection of the air passages leading to the lungs. A URI affects the nose, throat, and upper air passages. The most common   type of URI is nasopharyngitis and is typically referred to as "the common cold." URIs run their course and usually go away on their own. Most of the time, a URI does not require medical attention, but sometimes a  bacterial infection in the upper airways can follow a viral infection. This is called a secondary infection. Sinus and middle ear infections are common types of secondary upper respiratory infections. Bacterial pneumonia can also complicate a URI. A URI can worsen asthma and chronic obstructive pulmonary disease (COPD). Sometimes, these complications can require emergency medical care and may be life threatening.  CAUSES Almost all URIs are caused by viruses. A virus is a type of germ and can spread from one person to another.  RISKS FACTORS You may be at risk for a URI if:   You smoke.   You have chronic heart or lung disease.  You have a weakened defense (immune) system.   You are very young or very old.   You have nasal allergies or asthma.  You work in crowded or poorly ventilated areas.  You work in health care facilities or schools. SIGNS AND SYMPTOMS  Symptoms typically develop 2-3 days after you come in contact with a cold virus. Most viral URIs last 7-10 days. However, viral URIs from the influenza virus (flu virus) can last 14-18 days and are typically more severe. Symptoms may include:   Runny or stuffy (congested) nose.   Sneezing.   Cough.   Sore throat.   Headache.   Fatigue.   Fever.   Loss of appetite.   Pain in your forehead, behind your eyes, and over your cheekbones (sinus pain).  Muscle aches.  DIAGNOSIS  Your health care provider may diagnose a URI by:  Physical exam.  Tests to check that your symptoms are not due to another condition such as:  Strep throat.  Sinusitis.  Pneumonia.  Asthma. TREATMENT  A URI goes away on its own with time. It cannot be cured with medicines, but medicines may be prescribed or recommended to relieve symptoms. Medicines may help:  Reduce your fever.  Reduce your cough.  Relieve nasal congestion. HOME CARE INSTRUCTIONS   Take medicines only as directed by your health care provider.    Gargle warm saltwater or take cough drops to comfort your throat as directed by your health care provider.  Use a warm mist humidifier or inhale steam from a shower to increase air moisture. This may make it easier to breathe.  Drink enough fluid to keep your urine clear or pale yellow.   Eat soups and other clear broths and maintain good nutrition.   Rest as needed.   Return to work when your temperature has returned to normal or as your health care provider advises. You may need to stay home longer to avoid infecting others. You can also use a face mask and careful hand washing to prevent spread of the virus.  Increase the usage of your inhaler if you have asthma.   Do not use any tobacco products, including cigarettes, chewing tobacco, or electronic cigarettes. If you need help quitting, ask your health care provider. PREVENTION  The best way to protect yourself from getting a cold is to practice good hygiene.   Avoid oral or hand contact with people with cold symptoms.   Wash your hands often if contact occurs.  There is no clear evidence that vitamin C, vitamin E, echinacea, or exercise reduces the chance of developing a cold. However, it  is always recommended to get plenty of rest, exercise, and practice good nutrition.  SEEK MEDICAL CARE IF:   You are getting worse rather than better.   Your symptoms are not controlled by medicine.   You have chills.  You have worsening shortness of breath.  You have brown or red mucus.  You have yellow or brown nasal discharge.  You have pain in your face, especially when you bend forward.  You have a fever.  You have swollen neck glands.  You have pain while swallowing.  You have white areas in the back of your throat. SEEK IMMEDIATE MEDICAL CARE IF:   You have severe or persistent:  Headache.  Ear pain.  Sinus pain.  Chest pain.  You have chronic lung disease and any of the  following:  Wheezing.  Prolonged cough.  Coughing up blood.  A change in your usual mucus.  You have a stiff neck.  You have changes in your:  Vision.  Hearing.  Thinking.  Mood. MAKE SURE YOU:   Understand these instructions.  Will watch your condition.  Will get help right away if you are not doing well or get worse.   This information is not intended to replace advice given to you by your health care provider. Make sure you discuss any questions you have with your health care provider.   Document Released: 12/13/2000 Document Revised: 11/03/2014 Document Reviewed: 09/24/2013 Elsevier Interactive Patient Education 2016 [employer] Inc[employer]  Carpal Tunnel Syndrome Carpal tunnel syndrome is a condition that causes pain in your hand and arm. The carpal tunnel is a narrow area located on the palm side of your wrist. Repeated wrist motion or certain diseases may cause swelling within the tunnel. This swelling pinches the main nerve in the wrist (median nerve). CAUSES  This condition may be caused by:   Repeated wrist motions.  Wrist injuries.  Arthritis.  A cyst or tumor in the carpal tunnel.  Fluid buildup during pregnancy. Sometimes the cause of this condition is not known.  RISK FACTORS This condition is more likely to develop in:   People who have jobs that cause them to repeatedly move their wrists in the same motion, such as butchers and cashiers.  Women.  People with certain conditions, such as:  Diabetes.  Obesity.  An underactive thyroid (hypothyroidism).  Kidney failure. SYMPTOMS  Symptoms of this condition include:   A tingling feeling in your fingers, especially in your thumb, index, and middle fingers.  Tingling or numbness in your hand.  An aching feeling in your entire arm, especially when your wrist and elbow are bent for long periods of time.  Wrist pain that goes up your arm to your shoulder.  Pain that goes down into your palm or  fingers.  A weak feeling in your hands. You may have trouble grabbing and holding items. Your symptoms may feel worse during the night.  DIAGNOSIS  This condition is diagnosed with a medical history and physical exam. You may also have tests, including:   An electromyogram (EMG). This test measures electrical signals sent by your nerves into the muscles.  X-rays. TREATMENT  Treatment for this condition includes:  Lifestyle changes. It is important to stop doing or modify the activity that caused your condition.  Physical or occupational therapy.  Medicines for pain and inflammation. This may include medicine that is injected into your wrist.  A wrist splint.  Surgery. HOME CARE INSTRUCTIONS  If You Have a Splint:  Wear  it as told by your health care provider. Remove it only as told by your health care provider.  Loosen the splint if your fingers become numb and tingle, or if they turn cold and blue.  Keep the splint clean and dry. General Instructions  Take over-the-counter and prescription medicines only as told by your health care provider.  Rest your wrist from any activity that may be causing your pain. If your condition is work related, talk to your employer about changes that can be made, such as getting a wrist pad to use while typing.  If directed, apply ice to the painful area:  Put ice in a plastic bag.  Place a towel between your skin and the bag.  Leave the ice on for 20 minutes, 2-3 times per day.  Keep all follow-up visits as told by your health care provider. This is important.  Do any exercises as told by your health care provider, physical therapist, or occupational therapist. SEEK MEDICAL CARE IF:   You have new symptoms.  Your pain is not controlled with medicines.  Your symptoms get worse.   This information is not intended to replace advice given to you by your health care provider. Make sure you discuss any questions you have with your  health care provider.   Document Released: 06/16/2000 Document Revised: 03/10/2015 Document Reviewed: 11/04/2014 Elsevier Interactive Patient Education [employer]

## 2015-08-26 NOTE — ED Provider Notes (Signed)
CSN: [id number]     Arrival date & time 08/25/15  1837 History   By signing my name below, I, Arlan Organ, attest that this documentation has been prepared under the direction and in the presence of Loren Racer, MD.  Electronically Signed: Arlan Organ, ED Scribe. 08/26/2015. 1:05 AM.   Chief Complaint  Patient presents with  . Cough  . Shortness of Breath  . Fever   The history is provided by the patient. No language interpreter was used.    HPI Comments: Jacqueline Collins is a 30 y.o. female with a PMHx of asthma who presents to the Emergency Department complaining of constant, ongoing productive cough consisting of yellow and white mucous x 1 day. Pt also reports a fever of 100.6 onset yesterday evening along with myalgias, shortness of breath, sore throat, chest tightness, and intermittent numbness to hands bilaterally but mostly in the right hand. This is worse at night and denies any numbness currently. No weakness. No aggravating or alleviating factors at this time. No OTC medications or home remedies attempted prior to arrival. No recent nausea, vomiting, or diarrhea. Pt was evaluated in the Emergency Department on 2/21 for similar. At that time, pt was encourage to follow up out patient in 1 week if symptoms did not improve. Pt admits family was recently sick with similar symptoms.  PCP: No PCP Per Patient    Past Medical History  Diagnosis Date  . Asthma   . Obesity    History reviewed. No pertinent past surgical history. Family History  Problem Relation Age of Onset  . Hypertension Mother   . Diabetes Mother   . Breast cancer Maternal Aunt    Social History  Substance Use Topics  . Smoking status: Current Every Day Smoker -- 0.25 packs/day for 12 years  . Smokeless tobacco: None  . Alcohol Use: No   OB History    Gravida Para Term Preterm AB TAB SAB Ectopic Multiple Living   0              Review of Systems  Constitutional: Positive for fever and chills.  HENT:  Positive for sore throat.   Respiratory: Positive for cough and shortness of breath.   Cardiovascular: Positive for chest pain. Negative for palpitations and leg swelling.  Gastrointestinal: Negative for nausea, vomiting and abdominal pain.  Genitourinary: Negative for dysuria and difficulty urinating.  Musculoskeletal: Positive for myalgias and back pain. Negative for neck pain and neck stiffness.  Skin: Negative for rash and wound.  Neurological: Positive for numbness. Negative for dizziness, weakness and light-headedness.  Psychiatric/Behavioral: Negative for confusion.  All other systems reviewed and are negative.     Allergies  Review of patient's allergies indicates no known allergies.  Home Medications   Prior to Admission medications   Medication Sig Start Date End Date Taking? Authorizing Provider  aspirin-acetaminophen-caffeine (EXCEDRIN MIGRAINE) [phone] MG per tablet Take 1 tablet by mouth every 6 (six) hours as needed. For migraine    Yes Historical Provider, MD  ibuprofen (ADVIL,MOTRIN) 600 MG tablet Take 1 tablet (600 mg total) by mouth every 6 (six) hours as needed for fever or moderate pain. 08/26/15   Loren Racer, MD  oseltamivir (TAMIFLU) 75 MG capsule Take 1 capsule (75 mg total) by mouth 2 (two) times daily. 08/26/15   Loren Racer, MD  predniSONE (DELTASONE) 20 MG tablet Take 2 tablets (40 mg total) by mouth daily. 3 tabs po day one, then 2 tabs daily x 4 days 08/26/15  Loren Racer, MD   Triage Vitals: BP 97/46 mmHg  Pulse 92  Temp(Src) 101 F (38.3 C) (Oral)  Resp 16  Ht 5\' 2"  (1.575 m)  Wt 279 lb (126.554 kg)  BMI 51.02 kg/m2  SpO2 94%  LMP 08/01/2015 (Approximate)   Physical Exam  Constitutional: She is oriented to person, place, and time. She appears well-developed and well-nourished. No distress.  HENT:  Head: Normocephalic and atraumatic.  Mouth/Throat: Oropharynx is clear and moist. No oropharyngeal exudate.  The sinus tenderness to  percussion  Eyes: EOM are normal. Pupils are equal, round, and reactive to light.  Neck: Normal range of motion. Neck supple.  No meningismus  Cardiovascular: Normal rate and regular rhythm.   Pulmonary/Chest: Effort normal. No respiratory distress. She has wheezes. She has no rales.  Abdominal: Soft. Bowel sounds are normal. She exhibits no distension and no mass. There is no tenderness. There is no rebound and no guarding.  Musculoskeletal: Normal range of motion. She exhibits no edema or tenderness.  No lower extremity swelling or asymmetry. No CVA tenderness bilaterally. Positive Tinel sign on the right  Lymphadenopathy:    She has no cervical adenopathy.  Neurological: She is alert and oriented to person, place, and time. Abnormal muscle tone: few scattered expiratory wheezes.  Moves all extremities without deficit. Sensation is intact.  Skin: Skin is warm and dry. No rash noted. No erythema.  Psychiatric: She has a normal mood and affect. Her behavior is normal.  Nursing note and vitals reviewed.   ED Course  Procedures (including critical care time)  DIAGNOSTIC STUDIES: Oxygen Saturation is 98% on RA, Normal by my interpretation.    COORDINATION OF CARE: 1:03 AM- Will give breathing treatment and Tylenol. Will order CXR and EKG. Discussed treatment plan with pt at bedside and pt agreed to plan.     Labs Review Labs Reviewed - No data to display  Imaging Review Dg Chest 2 View  08/25/2015  CLINICAL DATA:  Chest pain for 1 week with productive cough and shortness of Breath EXAM: CHEST  2 VIEW COMPARISON:  08/24/2015 FINDINGS: Cardiac shadow is stable. Mild vascular congestion is again seen without significant edema. No focal infiltrate is noted. No bony abnormality is seen. IMPRESSION: Stable mild vascular congestion without focal infiltrate. Electronically Signed   By: Alcide Clever M.D.   On: 08/25/2015 19:36   I have personally reviewed and evaluated these images and lab  results as part of my medical decision-making.   EKG Interpretation   Date/Time:  Wednesday August 25 2015 18:41:57 EST Ventricular Rate:  103 PR Interval:  148 QRS Duration: 62 QT Interval:  322 QTC Calculation: 421 R Axis:   72 Text Interpretation:  Sinus tachycardia Low voltage QRS Borderline ECG  Confirmed by Ranae Palms  MD, Antania Hoefling ([id number]) on 08/26/2015 1:05:20 AM      MDM   Final diagnoses:  Influenza caused by unspecified influenza virus  Carpal tunnel syndrome of right wrist   I personally performed the services described in this documentation, which was scribed in my presence. The recorded information has been reviewed and is accurate.   Given breathing treatment with improvement of symptoms. Patient also given MDI. Suspect possible influenza. Given Tamiflu in the department. Patient is otherwise well-appearing. Her chest pain is more consistent tightness and related to taking a deep breath. The suspicion for PE. No evidence of ischemia on EKG.  Patient has a wrist splint and she's been encouraged to wear that as well as  take anti-inflammatory medications. She should symptoms continue she may need follow-up with a hand surgeon. Discussed return precautions and voiced understanding.  Loren Racer, MD 08/26/15 [phone]

## 2015-08-26 NOTE — ED Notes (Signed)
Patient left at this time with all belongings. 

## 2016-07-22 ENCOUNTER — Emergency Department (HOSPITAL_COMMUNITY)
Admission: EM | Admit: 2016-07-22 | Discharge: 2016-07-22 | Disposition: A | Discharge status: Home / Self Care | Payer: Self-pay | Attending: Emergency Medicine | Admitting: Emergency Medicine

## 2016-07-22 ENCOUNTER — Encounter (HOSPITAL_COMMUNITY): Payer: Self-pay

## 2016-07-22 DIAGNOSIS — K0889 Other specified disorders of teeth and supporting structures: Secondary | ICD-10-CM | POA: Insufficient documentation

## 2016-07-22 DIAGNOSIS — Y939 Activity, unspecified: Secondary | ICD-10-CM | POA: Insufficient documentation

## 2016-07-22 DIAGNOSIS — J45909 Unspecified asthma, uncomplicated: Secondary | ICD-10-CM | POA: Insufficient documentation

## 2016-07-22 DIAGNOSIS — Y929 Unspecified place or not applicable: Secondary | ICD-10-CM | POA: Insufficient documentation

## 2016-07-22 DIAGNOSIS — X58XXXA Exposure to other specified factors, initial encounter: Secondary | ICD-10-CM | POA: Insufficient documentation

## 2016-07-22 DIAGNOSIS — Y999 Unspecified external cause status: Secondary | ICD-10-CM | POA: Insufficient documentation

## 2016-07-22 DIAGNOSIS — F17200 Nicotine dependence, unspecified, uncomplicated: Secondary | ICD-10-CM | POA: Insufficient documentation

## 2016-07-22 DIAGNOSIS — S8012XA Contusion of left lower leg, initial encounter: Secondary | ICD-10-CM | POA: Insufficient documentation

## 2016-07-22 DIAGNOSIS — Z7982 Long term (current) use of aspirin: Secondary | ICD-10-CM | POA: Insufficient documentation

## 2016-07-22 MED ORDER — IBUPROFEN 800 MG PO TABS
800.0000 mg | ORAL_TABLET | Freq: Once | ORAL | Status: AC
  Administered 2016-07-22: 800 mg via ORAL
  Filled 2016-07-22: qty 1

## 2016-07-22 MED ORDER — IBUPROFEN 600 MG PO TABS: 600.0000 mg | ORAL_TABLET | Freq: Four times a day (QID) | ORAL | 0 refills | Status: DC | PRN

## 2016-07-22 MED ORDER — AMOXICILLIN 500 MG PO CAPS: 500.0000 mg | ORAL_CAPSULE | Freq: Three times a day (TID) | ORAL | 0 refills | Status: DC

## 2016-07-22 NOTE — ED Notes (Signed)
Declined W/C at D/C and was escorted to lobby by RN. 

## 2016-07-22 NOTE — Discharge Instructions (Signed)
Take ibuprofen for pain as prescribed. Amoxil until all gone. Follow up with a dentist.

## 2016-07-22 NOTE — ED Triage Notes (Signed)
Pt reports dental pain for several weeks. She reports she needs it pulled but wants to check for infection. Pt also states left leg pain, she has a bruise with not under neath. No redness noted.

## 2016-07-22 NOTE — ED Provider Notes (Signed)
[facility]-EMERGENCY DEPT Provider Note   CSN: [id number] Arrival date & time: 07/22/16  [id number]  By signing my name below, I, Soijett Blue, attest that this documentation has been prepared under the direction and in the presence of Jaynie Crumbleatyana Nelson Julson, [employer] CorporationPA-C Electronically Signed: Soijett Blue, ED Scribe. 07/22/16. 11:28 AM.  History   Chief Complaint Chief Complaint  Patient presents with  . Leg Pain  . Dental Pain    HPI Jacqueline CaffeyKendra Collins is a 31 y.o. female who presents to the Emergency Department complaining of left lower leg pain onset 1 week ago. Pt notes that she has a painful bruised area to her LLE and a knot to her LUE. She hasn't tried any medications for the relief of her symptoms. She denies wound, swelling, and any other symptoms.   Pt secondarily complains of right lower dental pain onset 6 months ago. Pt notes that she doesn't have a dentist, nor has she been to see one. Pt right lower dental pain is worsened with temperature change. Pt has tried oragel, excedrin, and amoxicillin once daily x 1 week with mild relief of her symptoms. Pt denies fever, chills, drainage, trouble swallowing, and any other symptoms.     The history is provided by the patient. No language interpreter was used.    Past Medical History:  Diagnosis Date  . Asthma   . Obesity     Patient Active Problem List   Diagnosis Date Noted  . Axillary lump 08/12/2013    History reviewed. No pertinent surgical history.  OB History    Gravida Para Term Preterm AB Living   0             SAB TAB Ectopic Multiple Live Births                   Home Medications    Prior to Admission medications   Medication Sig Start Date End Date Taking? Authorizing Provider  aspirin-acetaminophen-caffeine (EXCEDRIN MIGRAINE) [phone] MG per tablet Take 1 tablet by mouth every 6 (six) hours as needed. For migraine     Historical Provider, MD  ibuprofen (ADVIL,MOTRIN) 600 MG tablet Take 1 tablet (600 mg total) by mouth  every 6 (six) hours as needed for fever or moderate pain. 08/26/15   Loren Raceravid Yelverton, MD  oseltamivir (TAMIFLU) 75 MG capsule Take 1 capsule (75 mg total) by mouth 2 (two) times daily. 08/26/15   Loren Raceravid Yelverton, MD  predniSONE (DELTASONE) 20 MG tablet Take 2 tablets (40 mg total) by mouth daily. 3 tabs po day one, then 2 tabs daily x 4 days 08/26/15   Loren Raceravid Yelverton, MD    Family History Family History  Problem Relation Age of Onset  . Hypertension Mother   . Diabetes Mother   . Breast cancer Maternal Aunt     Social History Social History  Substance Use Topics  . Smoking status: Current Every Day Smoker    Packs/day: 0.25    Years: 12.00  . Smokeless tobacco: Never Used  . Alcohol use Yes     Comment: occ     Allergies   Patient has no known allergies.   Review of Systems Review of Systems  Constitutional: Negative for chills and fever.  HENT: Positive for dental problem (right lower). Negative for trouble swallowing.        No drainage to the affected area  Musculoskeletal: Positive for arthralgias (left lower leg). Negative for joint swelling.       +knot to LUE  Skin: Positive for color change (bruised area to LLE). Negative for wound.     Physical Exam Updated Vital Signs BP 119/95 (BP Location: Right Arm)   Pulse (!) 54   Temp 98.5 F (36.9 C) (Oral)   Resp 16   LMP 07/21/2016 (Within Days)   SpO2 100%   Physical Exam  Constitutional: She is oriented to person, place, and time. She appears well-developed and well-nourished. No distress.  HENT:  Head: Normocephalic and atraumatic.  Large caries in right lower premolar. No surrounding gum swelling. TTP over the touch. No trismus. No swelling under the tongue.   Eyes: EOM are normal.  Neck: Neck supple.  Cardiovascular: Normal rate.   Pulmonary/Chest: Effort normal. No respiratory distress.  Abdominal: She exhibits no distension.  Musculoskeletal: Normal range of motion.  Small hematoma to the left  anterior mid lateral shin. Normal calf. No pain with ankle dorsiflexion, no calf tenderness, no LE swelling.   Neurological: She is alert and oriented to person, place, and time.  Skin: Skin is warm and dry.  Small subcutaneous nodule to the mid lateral left arm.   Psychiatric: She has a normal mood and affect. Her behavior is normal.  Nursing note and vitals reviewed.    ED Treatments / Results  DIAGNOSTIC STUDIES: Oxygen Saturation is 100% on RA, nl by my interpretation.    COORDINATION OF CARE: 11:24 AM Discussed treatment plan with pt at bedside which includes referral and follow up with amoxicillin Rx and pt agreed to plan.   Procedures Procedures (including critical care time)  Medications Ordered in ED Medications - No data to display   Initial Impression / Assessment and Plan / ED Course  I have reviewed the triage vital signs and the nursing notes.  Pt with dental pain. No obvious signs of dental abscess. Pt does have a large carries. Will treat with amoxil, and ibuprofen. PT also has a contusion to the left lower shin. States she was concerned that it may have been a blood clot. No signs of DVT on exam with normal calf, and no LE swelling, negative homans sign. Pt latera dmitted she may have hit the leg at her job. Will dc home with outpatient dentist and PCP follow up as needed.   Vitals:   07/22/16 1015  BP: 119/95  Pulse: (!) 54  Resp: 16  Temp: 98.5 F (36.9 C)  TempSrc: Oral  SpO2: 100%     Final Clinical Impressions(s) / ED Diagnoses   Final diagnoses:  Pain, dental  Contusion of left lower extremity, initial encounter    New Prescriptions New Prescriptions   AMOXICILLIN (AMOXIL) 500 MG CAPSULE    Take 1 capsule (500 mg total) by mouth 3 (three) times daily.   IBUPROFEN (ADVIL,MOTRIN) 600 MG TABLET    Take 1 tablet (600 mg total) by mouth every 6 (six) hours as needed.   I personally performed the services described in this documentation, which was  scribed in my presence. The recorded information has been reviewed and is accurate.     Jaynie Crumble, PA-C 07/22/16 1149    Azalia Bilis, MD 07/22/16 [phone]

## 2016-12-01 ENCOUNTER — Emergency Department (HOSPITAL_COMMUNITY)
Admission: EM | Admit: 2016-12-01 | Discharge: 2016-12-01 | Disposition: A | Discharge status: Home / Self Care | Payer: Self-pay | Attending: Emergency Medicine | Admitting: Emergency Medicine

## 2016-12-01 ENCOUNTER — Encounter (HOSPITAL_COMMUNITY): Payer: Self-pay | Admitting: Emergency Medicine

## 2016-12-01 DIAGNOSIS — F17210 Nicotine dependence, cigarettes, uncomplicated: Secondary | ICD-10-CM | POA: Insufficient documentation

## 2016-12-01 DIAGNOSIS — J45909 Unspecified asthma, uncomplicated: Secondary | ICD-10-CM | POA: Insufficient documentation

## 2016-12-01 DIAGNOSIS — K0889 Other specified disorders of teeth and supporting structures: Secondary | ICD-10-CM | POA: Insufficient documentation

## 2016-12-01 MED ORDER — ACETAMINOPHEN 325 MG PO TABS
650.0000 mg | ORAL_TABLET | Freq: Once | ORAL | Status: AC
  Administered 2016-12-01: 650 mg via ORAL
  Filled 2016-12-01: qty 2

## 2016-12-01 MED ORDER — MAGIC MOUTHWASH: 5.0000 mL | Freq: Three times a day (TID) | ORAL | 0 refills | Status: DC | PRN

## 2016-12-01 MED ORDER — CLINDAMYCIN HCL 150 MG PO CAPS: 450.0000 mg | ORAL_CAPSULE | Freq: Four times a day (QID) | ORAL | 0 refills | Status: AC

## 2016-12-01 NOTE — ED Triage Notes (Signed)
Pt c/o 8/10 jaw pain and swelling on the right side for the past three days with some difficulty swallowing. Pt thinks it is infected. Pt reports a headache for the past week. Pt has been taking ibuprofen with minimal pain relief. Pt admits that she has been taking a friend's clindamycin - pt reports that she took 8 150mg  pills in the past 24 hours.   Pt also reports that she is a smoker and has noticed recently that she has started wheezing which has not been normal. Denies any worsening sob.

## 2016-12-01 NOTE — Discharge Instructions (Signed)
As discussed, apply heat to the area. Take your entire course of antibiotic even if you feel better. Follow up with a dentist tomorrow. Return to the ER if you experience narrowing in your throat, difficulty swallowing, increased swelling, difficulty breathing or any other new concerning symptoms in the meantime.

## 2016-12-01 NOTE — ED Provider Notes (Signed)
[facility]PT Provider Note   CSN: [id number] Arrival date & time: 12/01/16  0345     History   Chief Complaint Chief Complaint  Patient presents with  . Jaw Pain    HPI Jacqueline Collins is a 31 y.o. female presenting with 3 days of worsening right lower molar pain with associated swelling. She states that she believes she has an infection. She has tried ibuprofen and someone else's clindamycin for the last 2 days with some relief. She reports that the swelling has actually gone down. She has been able to drink and eat but has to chew on the left side. She endorses some nausea but no vomiting. Denies fever, difficulty swallowing or other symptoms  HPI  Past Medical History:  Diagnosis Date  . Asthma   . Obesity     Patient Active Problem List   Diagnosis Date Noted  . Axillary lump 08/12/2013    History reviewed. No pertinent surgical history.  OB History    Gravida Para Term Preterm AB Living   0             SAB TAB Ectopic Multiple Live Births                   Home Medications    Prior to Admission medications   Medication Sig Start Date End Date Taking? Authorizing Provider  amoxicillin (AMOXIL) 500 MG capsule Take 1 capsule (500 mg total) by mouth 3 (three) times daily. 07/22/16   Kirichenko, Lemont Fillers, PA-C  aspirin-acetaminophen-caffeine (EXCEDRIN MIGRAINE) [phone] MG per tablet Take 1 tablet by mouth every 6 (six) hours as needed. For migraine     [provider]  clindamycin (CLEOCIN) 150 MG capsule Take 3 capsules (450 mg total) by mouth every 6 (six) hours. 12/01/16 12/08/16  Mathews Robinsons B, PA-C  ibuprofen (ADVIL,MOTRIN) 600 MG tablet Take 1 tablet (600 mg total) by mouth every 6 (six) hours as needed. 07/22/16   Kirichenko, Lemont Fillers, PA-C  magic mouthwash SOLN Take 5 mLs by mouth 3 (three) times daily as needed for mouth pain. 12/01/16   Georgiana Shore, PA-C  oseltamivir (TAMIFLU) 75 MG capsule Take 1 capsule (75 mg total) by mouth 2 (two)  times daily. 08/26/15   Loren Racer, MD  predniSONE (DELTASONE) 20 MG tablet Take 2 tablets (40 mg total) by mouth daily. 3 tabs po day one, then 2 tabs daily x 4 days 08/26/15   Loren Racer, MD    Family History Family History  Problem Relation Age of Onset  . Hypertension Mother   . Diabetes Mother   . Breast cancer Maternal Aunt     Social History Social History  Substance Use Topics  . Smoking status: Current Every Day Smoker    Packs/day: 0.25    Years: 12.00  . Smokeless tobacco: Never Used  . Alcohol use Yes     Comment: occ     Allergies   Patient has no known allergies.   Review of Systems Review of Systems  Constitutional: Negative for chills and fever.  HENT: Positive for dental problem and facial swelling. Negative for drooling, ear pain, sinus pain, sinus pressure, sore throat, trouble swallowing and voice change.   Respiratory: Negative for cough, shortness of breath, wheezing and stridor.   Cardiovascular: Negative for chest pain and palpitations.  Gastrointestinal: Positive for nausea. Negative for abdominal pain and vomiting.  Musculoskeletal: Negative for arthralgias, back pain, neck pain and neck stiffness.  Skin: Negative for color change,  pallor and rash.     Physical Exam Updated Vital Signs BP 103/74 (BP Location: Right Arm)   Pulse 77   Temp 98.1 F (36.7 C) (Oral)   Resp 18   Ht 5\' 2"  (1.575 m)   Wt 124.3 kg (274 lb)   LMP 10/31/2016   SpO2 100%   BMI 50.12 kg/m   Physical Exam  Constitutional: She appears well-developed and well-nourished. No distress.  Afebrile, nontoxic-appearing, sitting comfortably in bed in no acute distress.  HENT:  Head: Normocephalic and atraumatic.  Mouth/Throat: Oropharynx is clear and moist. No oropharyngeal exudate.  No gross oral abscess. Uvula is midline, arches are simmetrical and intact. No peritonsilar swelling or exudate. No trismus. Sublingual mucosa is soft and non-tender. Tolerating  oral secretions. No concern for ludwig's angina.  Eyes: EOM are normal.  Neck: Normal range of motion. Neck supple.  Cardiovascular: Normal rate and regular rhythm.   Pulmonary/Chest: Effort normal. No respiratory distress.  Musculoskeletal: She exhibits no edema.  Lymphadenopathy:    She has cervical adenopathy.  Neurological: She is alert.  Skin: Skin is warm and dry. No rash noted. She is not diaphoretic. No erythema. No pallor.  Psychiatric: She has a normal mood and affect.  Nursing note and vitals reviewed.    ED Treatments / Results  Labs (all labs ordered are listed, but only abnormal results are displayed) Labs Reviewed - No data to display  EKG  EKG Interpretation None       Radiology No results found.  Procedures Procedures (including critical care time)  Medications Ordered in ED Medications  acetaminophen (TYLENOL) tablet 650 mg (not administered)     Initial Impression / Assessment and Plan / ED Course  I have reviewed the triage vital signs and the nursing notes.  Pertinent labs & imaging results that were available during my care of the patient were reviewed by me and considered in my medical decision making (see chart for details).    Patient with toothache.  No gross abscess.  Exam unconcerning for Ludwig's angina or spread of infection.  Will treat with Clindamycin and pain medicine OTC.  Urged patient to follow-up with dentist and provided resources.  Reassuring exam, patient is tolerating her oral secretions well. She is afebrile nontoxic appearing. Mild swelling of the right lower mandibular area.   Discussed strict return precautions and advised to return to the emergency department if experiencing any new or worsening symptoms. Instructions were understood and patient agreed with discharge plan.  Final Clinical Impressions(s) / ED Diagnoses   Final diagnoses:  Pain, dental    New Prescriptions New Prescriptions   CLINDAMYCIN (CLEOCIN)  150 MG CAPSULE    Take 3 capsules (450 mg total) by mouth every 6 (six) hours.   MAGIC MOUTHWASH SOLN    Take 5 mLs by mouth 3 (three) times daily as needed for mouth pain.     Georgiana ShoreMitchell, Ladine Kiper B, PA-C 12/01/16 0447    Ward, Layla MawKristen N, DO 12/01/16 [phone]

## 2017-01-09 ENCOUNTER — Encounter (HOSPITAL_COMMUNITY): Payer: Self-pay | Admitting: Emergency Medicine

## 2017-01-09 DIAGNOSIS — Z79899 Other long term (current) drug therapy: Secondary | ICD-10-CM | POA: Insufficient documentation

## 2017-01-09 DIAGNOSIS — Y990 Civilian activity done for income or pay: Secondary | ICD-10-CM | POA: Insufficient documentation

## 2017-01-09 DIAGNOSIS — F17210 Nicotine dependence, cigarettes, uncomplicated: Secondary | ICD-10-CM | POA: Insufficient documentation

## 2017-01-09 DIAGNOSIS — F0781 Postconcussional syndrome: Secondary | ICD-10-CM | POA: Insufficient documentation

## 2017-01-09 DIAGNOSIS — G4489 Other headache syndrome: Secondary | ICD-10-CM | POA: Insufficient documentation

## 2017-01-09 DIAGNOSIS — W228XXA Striking against or struck by other objects, initial encounter: Secondary | ICD-10-CM | POA: Insufficient documentation

## 2017-01-09 DIAGNOSIS — J45909 Unspecified asthma, uncomplicated: Secondary | ICD-10-CM | POA: Insufficient documentation

## 2017-01-09 DIAGNOSIS — Y929 Unspecified place or not applicable: Secondary | ICD-10-CM | POA: Insufficient documentation

## 2017-01-09 DIAGNOSIS — Y939 Activity, unspecified: Secondary | ICD-10-CM | POA: Insufficient documentation

## 2017-01-09 DIAGNOSIS — K047 Periapical abscess without sinus: Secondary | ICD-10-CM | POA: Insufficient documentation

## 2017-01-09 NOTE — ED Triage Notes (Signed)
Pt reports 2 days ago she bent over to pick up something and hit head on metal pole. Pt reports intermittent HA ever since with dizziness. Denies LOC, N/V, blurred vision. Also states she is having R lower dental pain, seen here recently for same S/S and given abx but the pain is starting to come back. Ambulatory.

## 2017-01-10 ENCOUNTER — Emergency Department (HOSPITAL_COMMUNITY): Payer: Self-pay

## 2017-01-10 ENCOUNTER — Emergency Department (HOSPITAL_COMMUNITY)
Admission: EM | Admit: 2017-01-10 | Discharge: 2017-01-10 | Disposition: A | Discharge status: Home / Self Care | Payer: Self-pay | Attending: Emergency Medicine | Admitting: Emergency Medicine

## 2017-01-10 DIAGNOSIS — K047 Periapical abscess without sinus: Secondary | ICD-10-CM

## 2017-01-10 DIAGNOSIS — F0781 Postconcussional syndrome: Secondary | ICD-10-CM

## 2017-01-10 MED ORDER — DIPHENHYDRAMINE HCL 25 MG PO CAPS
25.0000 mg | ORAL_CAPSULE | Freq: Once | ORAL | Status: AC
  Administered 2017-01-10: 25 mg via ORAL
  Filled 2017-01-10: qty 1

## 2017-01-10 MED ORDER — CLINDAMYCIN HCL 150 MG PO CAPS: 450.0000 mg | ORAL_CAPSULE | Freq: Three times a day (TID) | ORAL | 0 refills | Status: DC

## 2017-01-10 MED ORDER — DEXAMETHASONE SODIUM PHOSPHATE 10 MG/ML IJ SOLN
10.0000 mg | Freq: Once | INTRAMUSCULAR | Status: AC
  Administered 2017-01-10: 10 mg via INTRAMUSCULAR
  Filled 2017-01-10: qty 1

## 2017-01-10 MED ORDER — METOCLOPRAMIDE HCL 5 MG/ML IJ SOLN
10.0000 mg | Freq: Once | INTRAMUSCULAR | Status: AC
  Administered 2017-01-10: 10 mg via INTRAMUSCULAR
  Filled 2017-01-10: qty 2

## 2017-01-10 NOTE — Discharge Instructions (Signed)
1. Medications: Ibuprofen or Tylenol for pain 2. Treatment: Rest, ice on head.  Concussion precautions given - keep patient in a quiet, not simulating, dark environment. No TV, computer use, video games until headache is resolved completely. No contact sports until cleared by the physician. 3. Follow Up: With primary care physician on Monday if headache persists.  Return to the emergency department if patient becomes lethargic, begins vomiting, develops double vision, speech difficulty, problems walking or other change in mental status.  

## 2017-01-10 NOTE — ED Notes (Signed)
Patient stated she was stepping outside to wait and would return in a few minutes.

## 2017-01-10 NOTE — ED Provider Notes (Signed)
[facility]-EMERGENCY DEPT Provider Note   CSN: [id number] Arrival date & time: 01/09/17  2253     History   Chief Complaint Chief Complaint  Patient presents with  . Headache    HPI Jacqueline Collins is a 31 y.o. female with a hx of asthma presents to the Emergency Department complaining of acute persistent, progressively worsening generalized, throbbing headache onset Sunday evening after being struck in the head with a metal pole.  Pt reports she bent over to pick up a box at work and a metal pole struck her in the head.  Pt reports no LOC at that time or since that time.  She reports some associated nausea without vomiting.  She denies vision changes, numbness, tingling, syncope.  She reports no treatments PTA for her headache. She has associated photophobia. Patient reports she is not taking a blood better. No neck pain or neck stiffness. No fevers or chills.  Patient also complains of right lower dental pain. She reports she was seen for this several weeks ago and given an antibiotic which improved her symptoms significantly however she continues to have swelling around the gingiva. She has not followed up with dentistry at this time.     The history is provided by the patient and medical records. No language interpreter was used.    Past Medical History:  Diagnosis Date  . Asthma   . Obesity     Patient Active Problem List   Diagnosis Date Noted  . Axillary lump 08/12/2013    History reviewed. No pertinent surgical history.  OB History    Gravida Para Term Preterm AB Living   0             SAB TAB Ectopic Multiple Live Births                   Home Medications    Prior to Admission medications   Medication Sig Start Date End Date Taking? Authorizing Provider  amoxicillin (AMOXIL) 500 MG capsule Take 1 capsule (500 mg total) by mouth 3 (three) times daily. 07/22/16   Kirichenko, Lemont Fillers, PA-C  aspirin-acetaminophen-caffeine (EXCEDRIN MIGRAINE) [phone] MG per tablet Take  1 tablet by mouth every 6 (six) hours as needed. For migraine     [provider]  clindamycin (CLEOCIN) 150 MG capsule Take 3 capsules (450 mg total) by mouth 3 (three) times daily. 01/10/17   Shenandoah Yeats, Dahlia Client, PA-C  ibuprofen (ADVIL,MOTRIN) 600 MG tablet Take 1 tablet (600 mg total) by mouth every 6 (six) hours as needed. 07/22/16   Kirichenko, Lemont Fillers, PA-C  magic mouthwash SOLN Take 5 mLs by mouth 3 (three) times daily as needed for mouth pain. 12/01/16   Georgiana Shore, PA-C  oseltamivir (TAMIFLU) 75 MG capsule Take 1 capsule (75 mg total) by mouth 2 (two) times daily. 08/26/15   Loren Racer, MD  predniSONE (DELTASONE) 20 MG tablet Take 2 tablets (40 mg total) by mouth daily. 3 tabs po day one, then 2 tabs daily x 4 days 08/26/15   Loren Racer, MD    Family History Family History  Problem Relation Age of Onset  . Hypertension Mother   . Diabetes Mother   . Breast cancer Maternal Aunt     Social History Social History  Substance Use Topics  . Smoking status: Current Every Day Smoker    Packs/day: 0.25    Years: 12.00  . Smokeless tobacco: Never Used  . Alcohol use Yes     Comment: occ  Allergies   Patient has no known allergies.   Review of Systems Review of Systems  Constitutional: Negative for appetite change, diaphoresis, fatigue, fever and unexpected weight change.  HENT: Positive for dental problem. Negative for mouth sores.   Eyes: Positive for photophobia. Negative for visual disturbance.  Respiratory: Negative for cough, chest tightness, shortness of breath and wheezing.   Cardiovascular: Negative for chest pain.  Gastrointestinal: Positive for nausea. Negative for abdominal pain, constipation, diarrhea and vomiting.  Endocrine: Negative for polydipsia, polyphagia and polyuria.  Genitourinary: Negative for dysuria, frequency, hematuria and urgency.  Musculoskeletal: Negative for back pain and neck stiffness.  Skin: Negative for rash.    Allergic/Immunologic: Negative for immunocompromised state.  Neurological: Positive for headaches. Negative for syncope and light-headedness.  Hematological: Does not bruise/bleed easily.  Psychiatric/Behavioral: Negative for sleep disturbance. The patient is not nervous/anxious.   All other systems reviewed and are negative.    Physical Exam Updated Vital Signs BP 126/79   Pulse (!) 58   Temp 98.4 F (36.9 C) (Oral)   Resp 14   Ht 5\' 2"  (1.575 m)   Wt 127 kg (280 lb)   SpO2 100%   BMI 51.21 kg/m   Physical Exam  Constitutional: She is oriented to person, place, and time. She appears well-developed and well-nourished. No distress.  HENT:  Head: Normocephalic and atraumatic.  Right Ear: Tympanic membrane, external ear and ear canal normal.  Left Ear: Tympanic membrane, external ear and ear canal normal.  Nose: Nose normal. Right sinus exhibits no maxillary sinus tenderness and no frontal sinus tenderness. Left sinus exhibits no maxillary sinus tenderness and no frontal sinus tenderness.  Mouth/Throat: Uvula is midline, oropharynx is clear and moist and mucous membranes are normal. No oral lesions. Abnormal dentition. Dental caries present. No uvula swelling or lacerations. No oropharyngeal exudate, posterior oropharyngeal edema, posterior oropharyngeal erythema or tonsillar abscesses.  Right lower premolar with swelling and erythema of the surrounding gingiva. No gross abscess No fluctuance or induration to the buccal mucosa or floor of the mouth  Eyes: Conjunctivae and EOM are normal. Pupils are equal, round, and reactive to light. Right eye exhibits no discharge. Left eye exhibits no discharge. No scleral icterus.  No horizontal, vertical or rotational nystagmus  Neck: Normal range of motion. Neck supple.  Full active and passive ROM without pain No midline or paraspinal tenderness No nuchal rigidity or meningeal signs  Cardiovascular: Normal rate, regular rhythm, normal  heart sounds and intact distal pulses.   Pulmonary/Chest: Effort normal and breath sounds normal. No respiratory distress. She has no wheezes. She has no rales.  Equal chest rise  Abdominal: Soft. Bowel sounds are normal. She exhibits no distension. There is no tenderness. There is no rebound and no guarding.  Musculoskeletal: Normal range of motion.  Lymphadenopathy:    She has no cervical adenopathy.  Neurological: She is alert and oriented to person, place, and time. No cranial nerve deficit. She exhibits normal muscle tone. Coordination normal.  Mental Status:  Alert, oriented, thought content appropriate. Speech fluent without evidence of aphasia. Able to follow 2 step commands without difficulty.  Cranial Nerves:  II:  Peripheral visual fields grossly normal, pupils equal, round, reactive to light III,IV, VI: ptosis not present, extra-ocular motions intact bilaterally  V,VII: smile symmetric, facial light touch sensation equal VIII: hearing grossly normal bilaterally  IX,X: midline uvula rise  XI: bilateral shoulder shrug equal and strong XII: midline tongue extension  Motor:  5/5 in upper and  lower extremities bilaterally including strong and equal grip strength and dorsiflexion/plantar flexion Sensory: Pinprick and light touch normal in all extremities.  Cerebellar: normal finger-to-nose with bilateral upper extremities Gait: normal gait and balance CV: distal pulses palpable throughout   Skin: Skin is warm and dry. No rash noted. She is not diaphoretic.  Psychiatric: She has a normal mood and affect. Her behavior is normal. Judgment and thought content normal.  Nursing note and vitals reviewed.    ED Treatments / Results   Radiology Ct Head Wo Contrast  Result Date: 01/10/2017 CLINICAL DATA:  Hit head on metal pole 2 days prior. No loss of consciousness. EXAM: CT HEAD WITHOUT CONTRAST TECHNIQUE: Contiguous axial images were obtained from the base of the skull through the  vertex without intravenous contrast. COMPARISON:  None. FINDINGS: Brain: No evidence of acute infarction, hemorrhage, hydrocephalus, extra-axial collection or mass lesion/mass effect. Vascular: No hyperdense vessel or unexpected calcification. Skull: Normal. Negative for fracture or focal lesion. Sinuses/Orbits: Paranasal sinuses and mastoid air cells are clear. The visualized orbits are unremarkable. Other: None IMPRESSION: No acute intracranial abnormality.  No skull fracture. Electronically Signed   By: Rubye Oaks M.D.   On: 01/10/2017 00:59    Procedures Procedures (including critical care time)  Medications Ordered in ED Medications  metoCLOPramide (REGLAN) injection 10 mg (10 mg Intramuscular Given 01/10/17 0400)  diphenhydrAMINE (BENADRYL) capsule 25 mg (25 mg Oral Given 01/10/17 0400)  dexamethasone (DECADRON) injection 10 mg (10 mg Intramuscular Given 01/10/17 0400)     Initial Impression / Assessment and Plan / ED Course  I have reviewed the triage vital signs and the nursing notes.  Pertinent labs & imaging results that were available during my care of the patient were reviewed by me and considered in my medical decision making (see chart for details).     Patient with no focal neurological deficits on physical exam.  Discussed thoroughly symptoms to return to the emergency department including severe headaches, disequilibrium, vomiting, double vision, extremity weakness, difficulty ambulating, or any other concerning symptoms.  Discussed the likely etiology of patient's symptoms being postconcussive syndrome.  CT ordered in triage is without acute abnormality.  Improvement in headache with Reglan, decadron and benadryl.  Patient will be discharged with information pertaining to diagnosis and advised to use over-the-counter medications like NSAIDs and Tylenol for pain relief. Pt has also advised to not participate in contact sports until they are completely asymptomatic for at  least 1 week or they are cleared by their doctor.   Patient with toothache.  No gross abscess.  Exam unconcerning for Ludwig's angina or spread of infection.  Will treat with clindamycin and anti-inflammatories medicine.  Urged patient to follow-up with dentist.     Final Clinical Impressions(s) / ED Diagnoses   Final diagnoses:  Post concussive syndrome  Dental infection    New Prescriptions New Prescriptions   CLINDAMYCIN (CLEOCIN) 150 MG CAPSULE    Take 3 capsules (450 mg total) by mouth 3 (three) times daily.     Maryanne Huneycutt, Boyd Kerbs 01/10/17 1610    Derwood Kaplan, MD 01/10/17 [phone]

## 2017-06-13 ENCOUNTER — Encounter (HOSPITAL_COMMUNITY): Payer: Self-pay

## 2019-02-13 ENCOUNTER — Other Ambulatory Visit: Payer: Self-pay

## 2019-02-13 ENCOUNTER — Emergency Department (HOSPITAL_COMMUNITY)
Admission: EM | Admit: 2019-02-13 | Discharge: 2019-02-13 | Disposition: A | Discharge status: Home / Self Care | Payer: 59 | Attending: Emergency Medicine | Admitting: Emergency Medicine

## 2019-02-13 DIAGNOSIS — M5432 Sciatica, left side: Secondary | ICD-10-CM

## 2019-02-13 DIAGNOSIS — K5900 Constipation, unspecified: Secondary | ICD-10-CM

## 2019-02-13 DIAGNOSIS — J45909 Unspecified asthma, uncomplicated: Secondary | ICD-10-CM | POA: Diagnosis not present

## 2019-02-13 DIAGNOSIS — F17210 Nicotine dependence, cigarettes, uncomplicated: Secondary | ICD-10-CM | POA: Diagnosis not present

## 2019-02-13 DIAGNOSIS — R1084 Generalized abdominal pain: Secondary | ICD-10-CM | POA: Diagnosis present

## 2019-02-13 DIAGNOSIS — M62838 Other muscle spasm: Secondary | ICD-10-CM

## 2019-02-13 LAB — CBC
HCT: 38.1 % (ref 36.0–46.0)
Hemoglobin: 12.3 g/dL (ref 12.0–15.0)
MCH: 31.3 pg (ref 26.0–34.0)
MCHC: 32.3 g/dL (ref 30.0–36.0)
MCV: 96.9 fL (ref 80.0–100.0)
Platelets: 305 10*3/uL (ref 150–400)
RBC: 3.93 MIL/uL (ref 3.87–5.11)
RDW: 14.3 % (ref 11.5–15.5)
WBC: 7.1 10*3/uL (ref 4.0–10.5)
nRBC: 0 % (ref 0.0–0.2)

## 2019-02-13 LAB — URINALYSIS, ROUTINE W REFLEX MICROSCOPIC
Bilirubin Urine: NEGATIVE
Glucose, UA: NEGATIVE mg/dL
Hgb urine dipstick: NEGATIVE
Ketones, ur: NEGATIVE mg/dL
Leukocytes,Ua: NEGATIVE
Nitrite: NEGATIVE
Protein, ur: NEGATIVE mg/dL
Specific Gravity, Urine: 1.023 (ref 1.005–1.030)
pH: 5 (ref 5.0–8.0)

## 2019-02-13 LAB — COMPREHENSIVE METABOLIC PANEL
ALT: 14 U/L (ref 0–44)
AST: 21 U/L (ref 15–41)
Albumin: 3.8 g/dL (ref 3.5–5.0)
Alkaline Phosphatase: 77 U/L (ref 38–126)
Anion gap: 11 (ref 5–15)
BUN: 11 mg/dL (ref 6–20)
CO2: 23 mmol/L (ref 22–32)
Calcium: 9.4 mg/dL (ref 8.9–10.3)
Chloride: 101 mmol/L (ref 98–111)
Creatinine, Ser: 0.94 mg/dL (ref 0.44–1.00)
GFR calc Af Amer: >60 mL/min (ref >60)
GFR calc non Af Amer: >60 mL/min (ref >60)
Glucose, Bld: 126 mg/dL — ABNORMAL HIGH (ref 70–99)
Potassium: 4 mmol/L (ref 3.5–5.1)
Sodium: 135 mmol/L (ref 135–145)
Total Bilirubin: 0.8 mg/dL (ref 0.3–1.2)
Total Protein: 6.7 g/dL (ref 6.5–8.1)

## 2019-02-13 LAB — LIPASE, BLOOD: Lipase: 20 U/L (ref 11–51)

## 2019-02-13 MED ORDER — SODIUM CHLORIDE 0.9% FLUSH: 3.0000 mL | Freq: Once | INTRAVENOUS | Status: DC

## 2019-02-13 MED ORDER — METHOCARBAMOL 500 MG PO TABS
500.0000 mg | ORAL_TABLET | Freq: Once | ORAL | Status: AC
  Administered 2019-02-13: 500 mg via ORAL
  Filled 2019-02-13: qty 1

## 2019-02-13 MED ORDER — HYDROCODONE-ACETAMINOPHEN 5-325 MG PO TABS
1.0000 | ORAL_TABLET | Freq: Once | ORAL | Status: AC
  Administered 2019-02-13: 07:00:00 1 via ORAL
  Filled 2019-02-13: qty 1

## 2019-02-13 MED ORDER — NAPROXEN 250 MG PO TABS
500.0000 mg | ORAL_TABLET | Freq: Once | ORAL | Status: AC
  Administered 2019-02-13: 07:00:00 500 mg via ORAL
  Filled 2019-02-13: qty 2

## 2019-02-13 NOTE — ED Provider Notes (Signed)
[facility] Provider Note   CSN: [id number] Arrival date & time: 02/13/19  0252     History   Chief Complaint Chief Complaint  Patient presents with  . Abdominal Pain    HPI Jac[facility] is a 33 y.o. female.     HPI 33 year old female comes in a chief complaint of abdominal pain, back pain. Patient reports that she started having abdominal discomfort few about 2 weeks ago.  She has history of constipation, in fact she remains chronically constipated and sometimes when it gets worse she starts having this type of pain.  Her pain is worse every time she feels like she has to go defecate.  Her last bowel movement was yesterday but was not normal.  She is passing flatus.  She has no history of abdominal surgeries and denies any history of small bowel obstructions.  Review of system is negative for any UTI-like symptoms, history of ovarian tumor, cyst, fibroid, kidney stones and patient is certain that she is now pregnant (periods have been irregular).  Additionally patient complains of back pain.  She has an area of not in the lower back and she has pain radiating down her left lower extremity.  She reports that the pain radiating her back is new. Pt has no associated weakness, urinary incontinence, urinary retention, bowel incontinence, pins and needle sensation in the perineal area.   Past Medical History:  Diagnosis Date  . Asthma   . Obesity     Patient Active Problem List   Diagnosis Date Noted  . Axillary lump 08/12/2013    No past surgical history on file.   OB History    Gravida  0   Para      Term      Preterm      AB      Living        SAB      TAB      Ectopic      Multiple      Live Births               Home Medications    Prior to Admission medications   Medication Sig Start Date End Date Taking? Authorizing Provider  aspirin-acetaminophen-caffeine (EXCEDRIN MIGRAINE) [phone] MG per tablet  Take 1 tablet by mouth every 6 (six) hours as needed for migraine.    Yes [provider]  meloxicam (MOBIC) 15 MG tablet Take 15 mg by mouth daily.   Yes [provider]  methocarbamol (ROBAXIN) 500 MG tablet Take 500 mg by mouth at bedtime.   Yes [provider]  amoxicillin (AMOXIL) 500 MG capsule Take 1 capsule (500 mg total) by mouth 3 (three) times daily. Patient not taking: Reported on 02/13/2019 07/22/16   Jeannett Senior, PA-C  clindamycin (CLEOCIN) 150 MG capsule Take 3 capsules (450 mg total) by mouth 3 (three) times daily. Patient not taking: Reported on 02/13/2019 01/10/17   Muthersbaugh, Jarrett Soho, PA-C  ibuprofen (ADVIL,MOTRIN) 600 MG tablet Take 1 tablet (600 mg total) by mouth every 6 (six) hours as needed. Patient not taking: Reported on 02/13/2019 07/22/16   Jeannett Senior, PA-C  magic mouthwash SOLN Take 5 mLs by mouth 3 (three) times daily as needed for mouth pain. Patient not taking: Reported on 02/13/2019 12/01/16   Emeline General, PA-C  oseltamivir (TAMIFLU) 75 MG capsule Take 1 capsule (75 mg total) by mouth 2 (two) times daily. Patient not taking: Reported on 02/13/2019 08/26/15  Loren RacerYelverton, David, MD  predniSONE (DELTASONE) 20 MG tablet Take 2 tablets (40 mg total) by mouth daily. 3 tabs po day one, then 2 tabs daily x 4 days Patient not taking: Reported on 02/13/2019 08/26/15   Loren RacerYelverton, David, MD    Family History Family History  Problem Relation Age of Onset  . Hypertension Mother   . Diabetes Mother   . Breast cancer Maternal Aunt     Social History Social History   Tobacco Use  . Smoking status: Current Every Day Smoker    Packs/day: 0.25    Years: 12.00    Pack years: 3.00  . Smokeless tobacco: Never Used  Substance Use Topics  . Alcohol use: Yes    Comment: occ  . Drug use: No     Allergies   Patient has no known allergies.   Review of Systems Review of Systems  Constitutional: Positive for activity change.   Gastrointestinal: Positive for abdominal pain. Negative for vomiting.  Genitourinary: Negative for dysuria and flank pain.  Musculoskeletal: Positive for back pain.  Hematological: Does not bruise/bleed easily.  All other systems reviewed and are negative.    Physical Exam Updated Vital Signs BP 114/83   Pulse 77   Temp 98.2 F (36.8 C) (Oral)   Resp 18   SpO2 97%   Physical Exam Vitals signs and nursing note reviewed.  Constitutional:      Appearance: She is well-developed.     Comments: Morbidly obese female  HENT:     Head: Normocephalic and atraumatic.  Neck:     Musculoskeletal: Normal range of motion and neck supple.  Cardiovascular:     Rate and Rhythm: Normal rate.  Pulmonary:     Effort: Pulmonary effort is normal.  Abdominal:     General: Bowel sounds are normal.     Tenderness: There is generalized abdominal tenderness. Negative signs include Murphy's sign and McBurney's sign.     Comments: Abdominal exam is limited because of body habitus, however she is having generalized tenderness without any rebound or guarding  Skin:    General: Skin is warm and dry.  Neurological:     Mental Status: She is alert and oriented to person, place, and time.     Comments: Pt has tenderness over the lumbar region along with an area of muscle spasm in the peri-lumbar spine region No step offs, no erythema. Able to discriminate between sharp and dull. Able to ambulate       ED Treatments / Results  Labs (all labs ordered are listed, but only abnormal results are displayed) Labs Reviewed  COMPREHENSIVE METABOLIC PANEL - Abnormal; Notable for the following components:      Result Value   Glucose, Bld 126 (*)    All other components within normal limits  LIPASE, BLOOD  CBC  URINALYSIS, ROUTINE W REFLEX MICROSCOPIC  I-STAT BETA HCG BLOOD, ED ([facility], [facility], [facility] ONLY)    EKG None  Radiology No results found.  Procedures Procedures (including critical care time)   Medications Ordered in ED Medications  sodium chloride flush (NS) 0.9 % injection 3 mL (has no administration in time range)  naproxen (NAPROSYN) tablet 500 mg (has no administration in time range)  HYDROcodone-acetaminophen (NORCO/VICODIN) 5-325 MG per tablet 1 tablet (has no administration in time range)  methocarbamol (ROBAXIN) tablet 500 mg (has no administration in time range)     Initial Impression / Assessment and Plan / ED Course  I have reviewed the triage vital  signs and the nursing notes.  Pertinent labs & imaging results that were available during my care of the patient were reviewed by me and considered in my medical decision making (see chart for details).        33 year old female comes in with multiple complaints.  Her primary complaint is abdominal pain and she reports that she typically has this type of pain when she is constipated.  She is taking MiraLAX.  We recommended increasing fiber in her diet.  She is passing flatus, she has no focal abdominal tenderness and there is no peritonitis.  Still, patient's exam is limited because of her body habitus.  I recommended that we get acute abdominal series to ensure that she has evidence of severe constipation and if that is negative then consider ultrasound versus CT scan.  Patient confirms that she gets this episode of pain when constipated, and does not think she needs imaging.  Ultimately she agreed that if her symptoms get worse she will come to the ER for further diagnostic work-up.  Labs are all within normal limits, which is reassuring.  She is also noted to have some muscle spasms and perhaps radicular pain.  Patient has been requested to focus on weight loss and increasing core strength.  Back exercises will be provided.  Final Clinical Impressions(s) / ED Diagnoses   Final diagnoses:  Muscle spasm  Constipation, unspecified constipation type  Sciatica of left side    ED Discharge Orders    None        Derwood KaplanNanavati, Sangita Zani, MD 02/13/19 [phone]

## 2019-02-13 NOTE — Discharge Instructions (Addendum)
We saw in the ER for the abdominal discomfort and constipation.  You also are complaining of back pain. It appears that your back pain is complicated with muscle spasms and radiculopathy or nerve impingement.  Please take the medications prescribed and perform the exercises that we are recommending to strengthen your back.  Also consider increased exercise to lose weight.  For constipation we recommend you continue taking MiraLAX and increasing fiber in your diet.  We have provided phone number for primary care doctors in the area that you can follow-up with for further care.  You did not want any advanced imaging during this visit, but have agreed that he will return to the ER if your symptoms get worse.  Therefore if you start having excruciating abdominal pain, severe nausea with vomiting, bloody stools, fevers return to the ER immediately.

## 2019-02-13 NOTE — ED Notes (Signed)
Patient verbalizes understanding of discharge instructions. Opportunity for questioning and answers were provided. Armband removed by staff, pt discharged from ED ambulatory.   

## 2019-02-13 NOTE — ED Triage Notes (Signed)
Pt c/o abdominal pain and constipation x 2 days. Reports that she also has a knot in her back. C/o right leg pain as well.

## 2019-02-21 ENCOUNTER — Emergency Department (HOSPITAL_COMMUNITY)
Admission: EM | Admit: 2019-02-21 | Discharge: 2019-02-21 | Disposition: A | Discharge status: Home / Self Care | Payer: 59 | Attending: Emergency Medicine | Admitting: Emergency Medicine

## 2019-02-21 ENCOUNTER — Other Ambulatory Visit: Payer: Self-pay

## 2019-02-21 ENCOUNTER — Emergency Department (HOSPITAL_COMMUNITY): Payer: 59

## 2019-02-21 DIAGNOSIS — J45909 Unspecified asthma, uncomplicated: Secondary | ICD-10-CM | POA: Insufficient documentation

## 2019-02-21 DIAGNOSIS — Z791 Long term (current) use of non-steroidal anti-inflammatories (NSAID): Secondary | ICD-10-CM | POA: Diagnosis not present

## 2019-02-21 DIAGNOSIS — R1030 Lower abdominal pain, unspecified: Secondary | ICD-10-CM | POA: Diagnosis not present

## 2019-02-21 DIAGNOSIS — Z79899 Other long term (current) drug therapy: Secondary | ICD-10-CM | POA: Insufficient documentation

## 2019-02-21 DIAGNOSIS — M5441 Lumbago with sciatica, right side: Secondary | ICD-10-CM

## 2019-02-21 DIAGNOSIS — F17200 Nicotine dependence, unspecified, uncomplicated: Secondary | ICD-10-CM | POA: Insufficient documentation

## 2019-02-21 DIAGNOSIS — M545 Low back pain: Secondary | ICD-10-CM | POA: Diagnosis present

## 2019-02-21 LAB — CBC WITH DIFFERENTIAL/PLATELET
Abs Immature Granulocytes: 0.02 10*3/uL (ref 0.00–0.07)
Basophils Absolute: 0 10*3/uL (ref 0.0–0.1)
Basophils Relative: 0 %
Eosinophils Absolute: 0.3 10*3/uL (ref 0.0–0.5)
Eosinophils Relative: 3 %
HCT: 38.5 % (ref 36.0–46.0)
Hemoglobin: 12.5 g/dL (ref 12.0–15.0)
Immature Granulocytes: 0 %
Lymphocytes Relative: 29 %
Lymphs Abs: 2.3 10*3/uL (ref 0.7–4.0)
MCH: 30.9 pg (ref 26.0–34.0)
MCHC: 32.5 g/dL (ref 30.0–36.0)
MCV: 95.1 fL (ref 80.0–100.0)
Monocytes Absolute: 0.4 10*3/uL (ref 0.1–1.0)
Monocytes Relative: 6 %
Neutro Abs: 4.8 10*3/uL (ref 1.7–7.7)
Neutrophils Relative %: 62 %
Platelets: 300 10*3/uL (ref 150–400)
RBC: 4.05 MIL/uL (ref 3.87–5.11)
RDW: 14.4 % (ref 11.5–15.5)
WBC: 7.8 10*3/uL (ref 4.0–10.5)
nRBC: 0 % (ref 0.0–0.2)

## 2019-02-21 LAB — COMPREHENSIVE METABOLIC PANEL
ALT: 16 U/L (ref 0–44)
AST: 20 U/L (ref 15–41)
Albumin: 3.5 g/dL (ref 3.5–5.0)
Alkaline Phosphatase: 66 U/L (ref 38–126)
Anion gap: 9 (ref 5–15)
BUN: 9 mg/dL (ref 6–20)
CO2: 22 mmol/L (ref 22–32)
Calcium: 8.7 mg/dL — ABNORMAL LOW (ref 8.9–10.3)
Chloride: 104 mmol/L (ref 98–111)
Creatinine, Ser: 0.87 mg/dL (ref 0.44–1.00)
GFR calc Af Amer: >60 mL/min (ref >60)
GFR calc non Af Amer: >60 mL/min (ref >60)
Glucose, Bld: 114 mg/dL — ABNORMAL HIGH (ref 70–99)
Potassium: 3.9 mmol/L (ref 3.5–5.1)
Sodium: 135 mmol/L (ref 135–145)
Total Bilirubin: 1 mg/dL (ref 0.3–1.2)
Total Protein: 6.6 g/dL (ref 6.5–8.1)

## 2019-02-21 LAB — LIPASE, BLOOD: Lipase: 22 U/L (ref 11–51)

## 2019-02-21 LAB — I-STAT BETA HCG BLOOD, ED (MC, WL, AP ONLY): I-stat hCG, quantitative: <5 m[IU]/mL (ref <5)

## 2019-02-21 MED ORDER — METHOCARBAMOL 500 MG PO TABS: 1000.0000 mg | ORAL_TABLET | Freq: Every day | ORAL | 0 refills | Status: DC

## 2019-02-21 MED ORDER — IOHEXOL 300 MG/ML  SOLN
100.0000 mL | Freq: Once | INTRAMUSCULAR | Status: AC | PRN
  Administered 2019-02-21: 100 mL via INTRAVENOUS

## 2019-02-21 MED ORDER — IBUPROFEN 200 MG PO TABS: 400.0000 mg | ORAL_TABLET | Freq: Four times a day (QID) | ORAL | 1 refills | Status: DC | PRN

## 2019-02-21 NOTE — ED Triage Notes (Signed)
Pt c/o both abd pain and back pain for several months. Last BM was this a.m. Pt states that she stays constipated.

## 2019-02-21 NOTE — ED Provider Notes (Signed)
[facility] Provider Note   CSN: [id number] Arrival date & time: 02/21/19  [id number]     History   Chief Complaint Chief Complaint  Patient presents with  . Abdominal Pain  . Back Pain    HPI Jacqueline Collins is a 33 y.o. female.     Patient to ED with abdominal and back pain for several weeks. She was seen on 02/13/19 in the ED and diagnosed with constipation and muscular back pain. She has been using Miralax once daily and reports she has both soft bowel movements and those where she has to strain and the stool is hard. No blood per rectum. No vomiting, fever, urinary symptoms, chest pain, SOB or cough. She states her back continues to cause pain, R>L, that radiates into the right hip and anterior proximal thigh. No weakness or numbness. No urinary incontinence.   The history is provided by the patient. No language interpreter was used.  Abdominal Pain Associated symptoms: constipation   Associated symptoms: no chest pain, no chills, no fever, no shortness of breath and no vomiting   Back Pain Associated symptoms: abdominal pain   Associated symptoms: no chest pain, no fever, no numbness and no weakness     Past Medical History:  Diagnosis Date  . Asthma   . Obesity     Patient Active Problem List   Diagnosis Date Noted  . Axillary lump 08/12/2013    No past surgical history on file.   OB History    Gravida  0   Para      Term      Preterm      AB      Living        SAB      TAB      Ectopic      Multiple      Live Births               Home Medications    Prior to Admission medications   Medication Sig Start Date End Date Taking? Authorizing Provider  aspirin-acetaminophen-caffeine (EXCEDRIN MIGRAINE) [phone] MG per tablet Take 1 tablet by mouth every 6 (six) hours as needed for migraine.    Yes [provider]  ibuprofen (ADVIL) 200 MG tablet Take 400 mg by mouth every 6 (six) hours as needed  for headache or moderate pain.   Yes [provider]  meloxicam (MOBIC) 15 MG tablet Take 15 mg by mouth daily.   Yes [provider]  methocarbamol (ROBAXIN) 500 MG tablet Take 500 mg by mouth at bedtime.   Yes [provider]  ibuprofen (ADVIL,MOTRIN) 600 MG tablet Take 1 tablet (600 mg total) by mouth every 6 (six) hours as needed. Patient not taking: Reported on 02/13/2019 07/22/16   Jaynie CrumbleKirichenko, Tatyana, PA-C  magic mouthwash SOLN Take 5 mLs by mouth 3 (three) times daily as needed for mouth pain. Patient not taking: Reported on 02/13/2019 12/01/16   Gregary CromerMitchell, Jessica B, PA-C    Family History Family History  Problem Relation Age of Onset  . Hypertension Mother   . Diabetes Mother   . Breast cancer Maternal Aunt     Social History Social History   Tobacco Use  . Smoking status: Current Every Day Smoker    Packs/day: 0.25    Years: 12.00    Pack years: 3.00  . Smokeless tobacco: Never Used  Substance Use Topics  . Alcohol use: Yes    Comment:  occ  . Drug use: No     Allergies   Patient has no known allergies.   Review of Systems Review of Systems  Constitutional: Negative for chills and fever.  HENT: Negative.   Respiratory: Negative.  Negative for shortness of breath.   Cardiovascular: Negative.  Negative for chest pain.  Gastrointestinal: Positive for abdominal pain and constipation. Negative for blood in stool and vomiting.  Genitourinary: Negative for enuresis.  Musculoskeletal: Positive for back pain.  Skin: Negative.   Neurological: Negative.  Negative for weakness and numbness.     Physical Exam Updated Vital Signs BP 124/82   Pulse 67   Temp 98.6 F (37 C) (Oral)   Resp (!) 22   Ht 5\' 2"  (1.575 m)   Wt (!) 167.8 kg   SpO2 99%   BMI 67.67 kg/m   Physical Exam Constitutional:      General: She is not in acute distress.    Appearance: She is well-developed. She is obese. She is not ill-appearing.  Cardiovascular:      Rate and Rhythm: Normal rate and regular rhythm.     Heart sounds: No murmur.  Pulmonary:     Effort: Pulmonary effort is normal.     Breath sounds: No wheezing, rhonchi or rales.  Abdominal:     General: Bowel sounds are normal.     Palpations: Abdomen is soft.     Tenderness: There is abdominal tenderness in the right lower quadrant and left lower quadrant. There is no guarding or rebound.     Comments: Exam limited by body habitus.  Musculoskeletal: Normal range of motion.     Comments: Lower back tender to light palpation with tenderness that extends to right buttock, hip and proximal thigh. No mass, or discoloration.   Skin:    General: Skin is warm and dry.  Neurological:     Mental Status: She is alert and oriented to person, place, and time.      ED Treatments / Results  Labs (all labs ordered are listed, but only abnormal results are displayed) Labs Reviewed  COMPREHENSIVE METABOLIC PANEL - Abnormal; Notable for the following components:      Result Value   Glucose, Bld 114 (*)    Calcium 8.7 (*)    All other components within normal limits  CBC WITH DIFFERENTIAL/PLATELET  LIPASE, BLOOD  I-STAT BETA HCG BLOOD, ED ([facility], [facility], [facility] ONLY)   Results for orders placed or performed during the hospital encounter of 02/21/19  CBC with Differential  Result Value Ref Range   WBC 7.8 4.0 - 10.5 K/uL   RBC 4.05 3.87 - 5.11 MIL/uL   Hemoglobin 12.5 12.0 - 15.0 g/dL   HCT 69.638.5 29.536.0 - 28.446.0 %   MCV 95.1 80.0 - 100.0 fL   MCH 30.9 26.0 - 34.0 pg   MCHC 32.5 30.0 - 36.0 g/dL   RDW 13.214.4 44.011.5 - 10.215.5 %   Platelets 300 150 - 400 K/uL   nRBC 0.0 0.0 - 0.2 %   Neutrophils Relative % 62 %   Neutro Abs 4.8 1.7 - 7.7 K/uL   Lymphocytes Relative 29 %   Lymphs Abs 2.3 0.7 - 4.0 K/uL   Monocytes Relative 6 %   Monocytes Absolute 0.4 0.1 - 1.0 K/uL   Eosinophils Relative 3 %   Eosinophils Absolute 0.3 0.0 - 0.5 K/uL   Basophils Relative 0 %   Basophils Absolute 0.0 0.0 - 0.1 K/uL    Immature Granulocytes 0 %  Abs Immature Granulocytes 0.02 0.00 - 0.07 K/uL  Comprehensive metabolic panel  Result Value Ref Range   Sodium 135 135 - 145 mmol/L   Potassium 3.9 3.5 - 5.1 mmol/L   Chloride 104 98 - 111 mmol/L   CO2 22 22 - 32 mmol/L   Glucose, Bld 114 (H) 70 - 99 mg/dL   BUN 9 6 - 20 mg/dL   Creatinine, Ser 0.87 0.44 - 1.00 mg/dL   Calcium 8.7 (L) 8.9 - 10.3 mg/dL   Total Protein 6.6 6.5 - 8.1 g/dL   Albumin 3.5 3.5 - 5.0 g/dL   AST 20 15 - 41 U/L   ALT 16 0 - 44 U/L   Alkaline Phosphatase 66 38 - 126 U/L   Total Bilirubin 1.0 0.3 - 1.2 mg/dL   GFR calc non Af Amer >60 >60 mL/min   GFR calc Af Amer >60 >60 mL/min   Anion gap 9 5 - 15  Lipase, blood  Result Value Ref Range   Lipase 22 11 - 51 U/L  I-Stat beta hCG blood, ED  Result Value Ref Range   I-stat hCG, quantitative <5.0 <5 mIU/mL   Comment 3            EKG None  Radiology No results found.  Procedures Procedures (including critical care time)  Medications Ordered in ED Medications - No data to display   Initial Impression / Assessment and Plan / ED Course  I have reviewed the triage vital signs and the nursing notes.  Pertinent labs & imaging results that were available during my care of the patient were reviewed by me and considered in my medical decision making (see chart for details).        Patient returns to ED for further evaluation of persistent abdominal and back pain x weeks.   Chart reviewed. Patient opted not to have imaging at initial ED visit and reports she returned as instructed for further evaluation as her symptoms continue.   She is very well appearing, in NAD. VSS. Exam difficult/limited by morbid obesity. Will obtain CT abd/pel. Anticipate the patient will be able to be discharged home with PCP referral.   Patient care signed out to [facility], PA-C, pending CT results.   Final Clinical Impressions(s) / ED Diagnoses   Final diagnoses:  None   1. Abdominal  pain 2. Back pain  ED Discharge Orders    None       Dennie Bible 02/21/19 [place], April, MD 02/22/19 [id number]

## 2019-02-21 NOTE — ED Provider Notes (Signed)
9:33 AM Signout from of [place] PA-C at shift change.  Patient with complaint of back pain with right-sided sciatica symptoms as well as ongoing lower abdominal pains.  Patient has been treating herself for constipation.  Lab work-up was reassuring.  CT is negative.  Patient updated on results.  She will be continued on Robaxin and ibuprofen for her back symptoms.  We discussed stretching, exercise.  Strongly encourage PCP follow-up.  She is requesting referrals today.  The patient was urged to return to the Emergency Department immediately with worsening of current symptoms, worsening abdominal pain, persistent vomiting, blood noted in stools, fever, or any other concerns. The patient verbalized understanding.   No red flag s/s of low back pain. Patient was counseled on back pain precautions and told to do activity as tolerated but do not lift, push, or pull heavy objects more than 10 pounds for the next week.  Patient counseled to use ice or heat on back for no longer than 15 minutes every hour.   Patient counseled on proper use of muscle relaxant medication.  They were told not to drink alcohol, drive any vehicle, or do any dangerous activities while taking this medication.  Patient verbalized understanding.  Patient urged to follow-up with PCP if pain does not improve with treatment and rest or if pain becomes recurrent. Urged to return with worsening severe pain, loss of bowel or bladder control, trouble walking.   The patient verbalizes understanding and agrees with the plan.  BP (!) 129/91   Pulse 66   Temp 98.6 F (37 C) (Oral)   Resp 18   Ht 5\' 2"  (1.575 m)   Wt (!) 167.8 kg   SpO2 98%   BMI 67.67 kg/m      Carlisle Cater, PA-C 02/21/19 7416    Maudie Flakes, MD 02/24/19 1108

## 2019-02-21 NOTE — ED Notes (Signed)
Patient to CT with tech.

## 2019-02-21 NOTE — ED Notes (Signed)
"  Patient verbalizes understanding of discharge instructions. Opportunity for questioning and answers were provided.  pt discharged from ED ."  

## 2019-02-21 NOTE — Discharge Instructions (Signed)
Please read and follow all provided instructions.  Your diagnoses today include:  1. Acute bilateral low back pain with right-sided sciatica   2. Lower abdominal pain     Tests performed today include:  Blood counts and electrolytes  Blood tests to check liver and kidney function  Blood tests to check pancreas  CT scan that was normal.  Vital signs. See below for your results today.   Medications prescribed:   Robaxin (methocarbamol) - muscle relaxer medication  DO NOT drive or perform any activities that require you to be awake and alert because this medicine can make you drowsy.    Ibuprofen (Motrin, Advil) - anti-inflammatory pain medication  Do not exceed 600mg  ibuprofen every 6 hours, take with food  You have been prescribed an anti-inflammatory medication or NSAID. Take with food. Take smallest effective dose for the shortest duration needed for your pain. Stop taking if you experience stomach pain or vomiting.    Take any prescribed medications only as directed.  Home care instructions:   Follow any educational materials contained in this packet.  Follow-up instructions: Please follow-up with your primary care provider in the next 7 days for further evaluation of your symptoms.    Return instructions:  SEEK IMMEDIATE MEDICAL ATTENTION IF:  The pain does not go away or becomes severe   A temperature above 101F develops   Repeated vomiting occurs (multiple episodes)   The pain becomes localized to portions of the abdomen. The right side could possibly be appendicitis. In an adult, the left lower portion of the abdomen could be colitis or diverticulitis.   Blood is being passed in stools or vomit (bright red or black tarry stools)   You develop chest pain, difficulty breathing, dizziness or fainting, or become confused, poorly responsive, or inconsolable (young children)  If you have any other emergent concerns regarding your health  Additional  Information: Abdominal (belly) pain can be caused by many things. Your caregiver performed an examination and possibly ordered blood/urine tests and imaging (CT scan, x-rays, ultrasound). Many cases can be observed and treated at home after initial evaluation in the emergency department. Even though you are being discharged home, abdominal pain can be unpredictable. Therefore, you need a repeated exam if your pain does not resolve, returns, or worsens. Most patients with abdominal pain don't have to be admitted to the hospital or have surgery, but serious problems like appendicitis and gallbladder attacks can start out as nonspecific pain. Many abdominal conditions cannot be diagnosed in one visit, so follow-up evaluations are very important.  Your vital signs today were: BP (!) 129/91    Pulse 66    Temp 98.6 F (37 C) (Oral)    Resp 18    Ht 5\' 2"  (1.575 m)    Wt (!) 167.8 kg    SpO2 98%    BMI 67.67 kg/m  If your blood pressure (bp) was elevated above 135/85 this visit, please have this repeated by your doctor within one month. --------------

## 2019-02-21 NOTE — ED Notes (Signed)
Pt to ct a this time.

## 2019-03-06 ENCOUNTER — Encounter: Payer: Self-pay | Admitting: Nurse Practitioner

## 2019-03-06 ENCOUNTER — Ambulatory Visit: Payer: 59 | Admitting: Nurse Practitioner

## 2019-03-06 ENCOUNTER — Other Ambulatory Visit: Payer: Self-pay

## 2019-03-06 VITALS — HR 81 | Temp 98.8°F | Ht 63.5 in | Wt 385.0 lb

## 2019-03-06 DIAGNOSIS — K5909 Other constipation: Secondary | ICD-10-CM | POA: Diagnosis not present

## 2019-03-06 DIAGNOSIS — R1031 Right lower quadrant pain: Secondary | ICD-10-CM | POA: Diagnosis not present

## 2019-03-06 DIAGNOSIS — M545 Low back pain, unspecified: Secondary | ICD-10-CM

## 2019-03-06 DIAGNOSIS — R109 Unspecified abdominal pain: Secondary | ICD-10-CM | POA: Diagnosis not present

## 2019-03-06 DIAGNOSIS — G8929 Other chronic pain: Secondary | ICD-10-CM

## 2019-03-06 NOTE — Patient Instructions (Addendum)
If you are age 32 or older, your body mass index should be between 23-30. Your Body mass index is 67.13 kg/m. If this is out of the aforementioned range listed, please consider follow up with your Primary Care Provider.  If you are age 59 or younger, your body mass index should be between 19-25. Your Body mass index is 67.13 kg/m. If this is out of the aformentioned range listed, please consider follow up with your Primary Care Provider.   Magnesium Citrate today - as directed on bottle. (over-the-counter)  Start Miralax every day (over-the-counter)  Start Benefiber every day (over-the-counter)  Drink 64 ounces of water every day.  Follow up on 03/27/19 at 9 am.  Thank you for choosing me and [facility].   Tye Savoy, NP

## 2019-03-06 NOTE — Progress Notes (Signed)
ASSESSMENT / PLAN:   73.  33 year old female with chronic constipation (since childhood) with associated mid abdominal pain.  Pain improves with defecation. She may possibly have IBS-C-Her last bowel movement was approximately 2 weeks ago.  We will purge bowels today with magnesium citrate as directed on bottle.  - MiraLAX helped but patient did not realize she could take it beyond 1 week.  Recommended daily MiraLAX.  - Start daily Benefiber - Increase water intake to 64 ounces daily. -Follow-up with me in 3 to 4 weeks -At some point will consider Linzess which can help with the pain associated with constipation  2.  Lower back pain / RLE pain.  This started a month or so ago.  While constipation can cause lower back discomfort, I doubt this pain is GI related especially since it radiates down the back of her right leg.  Of note, patient started a new job requiring lifting around the time her pain started. - will purge bowels and start aggressive bowel regimen.  If low back pain is in fact related to constipation then it should improve  3. Morbid obesity.    HPI:    Chief Complaint:   Chronic constipation with abdominal pain, low back pain and right leg pain.   Jacqueline Collins is a 33 year old female, new to the practice, here for evaluation of constipation and abdominal pain.  She gives a lifelong history of chronic constipation associated with mid abdominal pain.  She has infrequent bowel movements with straining.  A few weeks ago she began having pain in right lower abdomen radiating around to her right lower back and into right lower extremity.  She was seen in the ED on 02/13/2019 on 02/21/2019.  Abdominal pain was felt to be related to constipation.  Lower back pain and leg pain felt to be muscle spasm.  CT AP with contrast was normal.  Liver test, lipase and CBC also normal constipation which she was prescribed MiraLAX, took it for 1 week.  Patient definitely improved with the  MiraLAX but patient did not realize she could take it beyond 1 week and so has not had any in a couple of weeks.  Her last adequate bowel movement was 2 weeks ago after stopping the MiraLAX.  Occasionally she will have a small amount of blood on tissue with excessive wiping  Of note, patient recently started a new job requiring her to lift boxes.  It was around this time that she developed low back pain.   Data Reviewed:  ED 02/21/19 CT AP with contrast was normal.   Liver test, lipase and CBC all normal   Past Medical History:  Diagnosis Date  . Asthma   . Obesity     History reviewed. No pertinent surgical history. Family History  Problem Relation Age of Onset  . Hypertension Mother   . Diabetes Mother   . Breast cancer Maternal Aunt    Social History   Tobacco Use  . Smoking status: Current Every Day Smoker    Types: E-cigarettes  . Smokeless tobacco: Never Used  . Tobacco comment: occasional vaping  Substance Use Topics  . Alcohol use: Yes    Comment: occ  . Drug use: No   Current Outpatient Medications  Medication Sig Dispense Refill  . aspirin-acetaminophen-caffeine (EXCEDRIN MIGRAINE) 250-250-65 MG per tablet Take 1 tablet by mouth every 6 (six) hours as needed for migraine.     Marland Kitchen  ibuprofen (ADVIL) 200 MG tablet Take 2 tablets (400 mg total) by mouth every 6 (six) hours as needed for headache or moderate pain. 30 tablet 1  . meloxicam (MOBIC) 15 MG tablet Take 15 mg by mouth daily.    . methocarbamol (ROBAXIN) 500 MG tablet Take 2 tablets (1,000 mg total) by mouth at bedtime. 20 tablet 0   No current facility-administered medications for this visit.    No Known Allergies   Review of Systems: Positive for frequent bilateral lower extremity swelling . All systems reviewed and negative except where noted in HPI.   Serum creatinine: 0.87 mg/dL 16/04/9607/21/20 [id number] Estimated creatinine clearance: 149.5 mL/min   Physical Exam:    Wt Readings from Last 3 Encounters:   03/06/19 (!) 385 lb (174.6 kg)  02/21/19 (!) 370 lb (167.8 kg)  01/09/17 280 lb (127 kg)    Pulse 81   Temp 98.8 F (37.1 C)   Ht 5' 3.5" (1.613 m)   Wt (!) 385 lb (174.6 kg)   BMI 67.13 kg/m  Constitutional:  Pleasant morbidly obese female in no acute distress. Psychiatric: Normal mood and affect. Behavior is normal. EENT: Pupils normal.  Conjunctivae are normal. No scleral icterus. Neck supple.  Cardiovascular: Normal rate, regular rhythm. No edema Pulmonary/chest: Effort normal and breath sounds normal. No wheezing, rales or rhonchi. Abdominal: Soft, nondistended, nontender. Bowel sounds active throughout. There are no obvious masses Neurological: Alert and oriented to person place and time. Skin: Skin is warm and dry. No rashes noted.  Willette ClusterPaula Bryn Saline, NP  03/06/2019, 10:14 AM

## 2019-03-06 NOTE — Progress Notes (Signed)
Agree with the assessment and plan as outlined by Rubbie Battiest, NP.  Eberardo Demello, DO, [facility]

## 2019-03-06 NOTE — Addendum Note (Signed)
Addended by: Lavena Bullion on: 03/06/2019 04:25 PM   Modules accepted: Level of Service

## 2019-03-27 ENCOUNTER — Ambulatory Visit: Payer: 59 | Admitting: Nurse Practitioner

## 2019-04-22 ENCOUNTER — Ambulatory Visit (INDEPENDENT_AMBULATORY_CARE_PROVIDER_SITE_OTHER): Payer: 59 | Admitting: Nurse Practitioner

## 2019-04-22 ENCOUNTER — Encounter: Payer: Self-pay | Admitting: Nurse Practitioner

## 2019-04-22 ENCOUNTER — Other Ambulatory Visit: Payer: Self-pay

## 2019-04-22 VITALS — BP 124/62 | HR 77 | Temp 98.0°F | Ht 62.0 in | Wt 382.0 lb

## 2019-04-22 DIAGNOSIS — E669 Obesity, unspecified: Secondary | ICD-10-CM | POA: Diagnosis not present

## 2019-04-22 DIAGNOSIS — K5909 Other constipation: Secondary | ICD-10-CM

## 2019-04-22 DIAGNOSIS — K6289 Other specified diseases of anus and rectum: Secondary | ICD-10-CM | POA: Diagnosis not present

## 2019-04-22 DIAGNOSIS — R1084 Generalized abdominal pain: Secondary | ICD-10-CM

## 2019-04-22 MED ORDER — HYOSCYAMINE SULFATE 0.125 MG SL SUBL: 0.1250 mg | SUBLINGUAL_TABLET | Freq: Two times a day (BID) | SUBLINGUAL | 1 refills | Status: DC | PRN

## 2019-04-22 NOTE — Progress Notes (Signed)
Chief Complaint:    Follow up on constipation / abdominal pain   IMPRESSION and PLAN:     1. 33 yo female with chronic constipation with associated abdominal pain relieved with defecation. Both constipation and abdominal pain nearly resolved with daily fiber and daily Miralax.   She may have constipation predominant IBS.  --continue daily Miralax and daily fiber. She will call back for refractory constipation / abdominal pain, blood in stool or other concerning symptoms.   2. Morbid obesity. She would like to learn about healthier food choices and wants to lose weight.  -Will refer to Dietician  3. Transient sharp anal pain, ? proctalgia fugax.  -Trial of Levsin SL as needed  4. Generalized muscle spasms. She has been unsuccessful in finding a PCP who is accepting new patients during [place] pandemic. She gave the names of some practices who may take a new patient  HPI:     Jacqueline Collins is a 33 year old female who I saw as a new patient to the practice on 03/06/2019, please refer to that office note.  Briefly, she has a history of chronic constipation associated with mid abdominal pain relieved with defecation.  At the time of our last visit she was also having lower back pain and RLE pain.  I recommended fiber, increased fluid intake and daily MiraLAX with plans to consider starting Linzess if needed.  She is here for follow-up  Jacqueline Collins has had significant improvement in constipation and abdominal pain with combination of fiber and daily Miralax. She ran out of fiber a week ago and isn't doing quite as well on just the Miralax alone. While taking both she was having a soft BM every day and little to no more abdominal pain. About once a week she gets spasm like sensations of anal sphincter which isn't really painful but she sometimes she gets transient shooting pains in anorectal area.    Jacqueline Collins wants to learn how to make healthier food choices and she is wants to lose weight. She has been  trying to find a PCP but no one seems to be taking new patients during [place] pandemic.   Review of systems:     No chest pain, no SOB, no fevers, no urinary sx   Past Medical History:  Diagnosis Date  . Asthma   . Obesity     Patient's surgical history, family medical history, social history, medications and allergies were all reviewed in Epic   Current Outpatient Medications  Medication Sig Dispense Refill  . aspirin-acetaminophen-caffeine (EXCEDRIN MIGRAINE) 250-250-65 MG per tablet Take 1 tablet by mouth every 6 (six) hours as needed for migraine.     Marland Kitchen ibuprofen (ADVIL) 200 MG tablet Take 2 tablets (400 mg total) by mouth every 6 (six) hours as needed for headache or moderate pain. 30 tablet 1  . meloxicam (MOBIC) 15 MG tablet Take 15 mg by mouth daily.    . methocarbamol (ROBAXIN) 500 MG tablet Take 2 tablets (1,000 mg total) by mouth at bedtime. 20 tablet 0   No current facility-administered medications for this visit.     Physical Exam:     BP 124/62   Pulse 77   Temp 98 F (36.7 C)   Ht 5\' 2"  (1.575 m)   Wt (!) 382 lb (173.3 kg)   SpO2 98%   BMI 69.87 kg/m   GENERAL:  Pleasant female in NAD PSYCH: : Cooperative, normal affect EENT:  conjunctiva pink, mucous membranes moist, neck  supple without masses CARDIAC:  RRR, 1+ BLE edema PULM: Normal respiratory effort ABDOMEN:  Nondistended, soft, nontender. No obvious masses, no hepatomegaly,  normal bowel sounds SKIN:  turgor, no lesions seen Musculoskeletal:  Normal muscle tone, normal strength NEURO: Alert and oriented x 3, no focal neurologic deficits   Jacqueline Collins , NP 04/22/2019, 9:12 AM

## 2019-04-22 NOTE — Patient Instructions (Addendum)
If you are age 32 or older, your body mass index should be between 23-30. Your Body mass index is 69.87 kg/m. If this is out of the aforementioned range listed, please consider follow up with your Primary Care Provider.  If you are age 31 or younger, your body mass index should be between 19-25. Your Body mass index is 69.87 kg/m. If this is out of the aformentioned range listed, please consider follow up with your Primary Care Provider.   We have sent the following medications to your pharmacy for you to pick up at your convenience: Levsin  Continue daily Miralax and Fiber.  You are being referred to Weight Management  - they will contact you with an appointment.  Follow up as needed.  Thank you for choosing me and [facility].   Tye Savoy, NP  Primary Care Physician you can contact to establish care:  [facility]: [facility]: [phone]

## 2019-04-23 NOTE — Progress Notes (Signed)
Agree with the assessment and plan as outlined by Paula Guenther, NP. ° °Daneya Hartgrove, DO, FACG ° °

## 2019-06-10 ENCOUNTER — Other Ambulatory Visit: Payer: Self-pay | Admitting: *Deleted

## 2019-06-10 DIAGNOSIS — Z20822 Contact with and (suspected) exposure to covid-19: Secondary | ICD-10-CM

## 2019-06-12 ENCOUNTER — Telehealth: Payer: Self-pay

## 2019-06-12 NOTE — Telephone Encounter (Signed)
Caller advise that result are not back yet 

## 2019-06-13 ENCOUNTER — Telehealth: Payer: Self-pay | Admitting: *Deleted

## 2019-06-13 LAB — NOVEL CORONAVIRUS, NAA: SARS-CoV-2, NAA: NOT DETECTED

## 2019-06-13 NOTE — Telephone Encounter (Signed)
Patient informed of negative covid results ,also set up with my chart .

## 2019-08-09 ENCOUNTER — Encounter (HOSPITAL_COMMUNITY): Payer: Self-pay

## 2019-08-09 ENCOUNTER — Other Ambulatory Visit: Payer: Self-pay

## 2019-08-09 ENCOUNTER — Emergency Department (HOSPITAL_COMMUNITY): Payer: 59

## 2019-08-09 ENCOUNTER — Emergency Department (HOSPITAL_COMMUNITY)
Admission: EM | Admit: 2019-08-09 | Discharge: 2019-08-09 | Disposition: A | Discharge status: Home / Self Care | Payer: 59 | Attending: Emergency Medicine | Admitting: Emergency Medicine

## 2019-08-09 DIAGNOSIS — J45909 Unspecified asthma, uncomplicated: Secondary | ICD-10-CM | POA: Insufficient documentation

## 2019-08-09 DIAGNOSIS — F17290 Nicotine dependence, other tobacco product, uncomplicated: Secondary | ICD-10-CM | POA: Diagnosis not present

## 2019-08-09 DIAGNOSIS — Z79899 Other long term (current) drug therapy: Secondary | ICD-10-CM | POA: Diagnosis not present

## 2019-08-09 DIAGNOSIS — K0889 Other specified disorders of teeth and supporting structures: Secondary | ICD-10-CM | POA: Diagnosis not present

## 2019-08-09 DIAGNOSIS — K068 Other specified disorders of gingiva and edentulous alveolar ridge: Secondary | ICD-10-CM | POA: Insufficient documentation

## 2019-08-09 DIAGNOSIS — R042 Hemoptysis: Secondary | ICD-10-CM | POA: Diagnosis present

## 2019-08-09 MED ORDER — MAGIC MOUTHWASH W/LIDOCAINE: 5.0000 mL | Freq: Three times a day (TID) | ORAL | 0 refills | Status: DC | PRN

## 2019-08-09 NOTE — Discharge Instructions (Signed)
Your chest xray today did not show bronchitis or pneumonia.  The blood in your mouth from earlier today was likely from your teeth.  Continue taking your antibiotic as directed.  You may also rinse with warm salt water if needed.  Follow-up with a dentist soon.  Also, check your blood pressure closely as it was slightly elevated today.  Return here if needed

## 2019-08-09 NOTE — ED Provider Notes (Signed)
[facility] Provider Note   CSN: [id number] Arrival date & time: 08/09/19  1627     History Chief Complaint  Patient presents with  . Hemoptysis    Jacqueline Collins is a 34 y.o. female.  HPI      Jacqueline Collins is a 34 y.o. female who presents to the Emergency Department complaining of one episodes of spitting up blood.  She states that she was resting after cleaning her house, and noticed bright red blood in her mouth.  She has two dental caries that have recently broken off at the gums and she is unsure if the blood came from her teeth or her throat.  She admits that her   Throat has been feel irritated, She is currently taking amoxicillin for her dental pain. She also complains of intermittent cough, but denies hemoptysis.  She denies fever, neck pain, difficulty swallowing, shortness of breath and facial swelling.  No known COVID exposures.      Past Medical History:  Diagnosis Date  . Asthma   . Obesity     Patient Active Problem List   Diagnosis Date Noted  . Axillary lump 08/12/2013    Past Surgical History:  Procedure Laterality Date  . MOUTH SURGERY       OB History    Gravida  0   Para      Term      Preterm      AB      Living        SAB      TAB      Ectopic      Multiple      Live Births              Family History  Problem Relation Age of Onset  . Hypertension Mother   . Diabetes Mother   . Breast cancer Maternal Aunt     Social History   Tobacco Use  . Smoking status: Current Every Day Smoker    Types: E-cigarettes  . Smokeless tobacco: Never Used  . Tobacco comment: occasional vaping  Substance Use Topics  . Alcohol use: Yes    Comment: occ  . Drug use: No    Home Medications Prior to Admission medications   Medication Sig Start Date End Date Taking? Authorizing Provider  aspirin-acetaminophen-caffeine (EXCEDRIN MIGRAINE) [phone] MG per tablet Take 1 tablet by mouth every 6 (six) hours as  needed for migraine.     [provider]  hyoscyamine (LEVSIN SL) 0.125 MG SL tablet Place 1 tablet (0.125 mg total) under the tongue 2 (two) times daily as needed. 04/22/19   Willia Craze, NP  ibuprofen (ADVIL) 200 MG tablet Take 2 tablets (400 mg total) by mouth every 6 (six) hours as needed for headache or moderate pain. 02/21/19   Carlisle Cater, PA-C  meloxicam (MOBIC) 15 MG tablet Take 15 mg by mouth daily.    [provider]  methocarbamol (ROBAXIN) 500 MG tablet Take 2 tablets (1,000 mg total) by mouth at bedtime. 02/21/19   Carlisle Cater, PA-C    Allergies    Patient has no known allergies.  Review of Systems   Review of Systems  Constitutional: Negative for appetite change and fever.  HENT: Positive for dental problem and sore throat. Negative for congestion, ear pain, facial swelling and trouble swallowing.   Eyes: Negative for pain and visual disturbance.  Respiratory: Positive for cough. Negative for chest tightness and shortness of breath.  Cardiovascular: Negative for chest pain.  Gastrointestinal: Negative for abdominal pain, diarrhea, nausea and vomiting.  Genitourinary: Negative for dysuria and flank pain.  Musculoskeletal: Negative for neck pain and neck stiffness.  Skin: Negative for rash.  Neurological: Negative for facial asymmetry, weakness, numbness and headaches.  Hematological: Negative for adenopathy.    Physical Exam Updated Vital Signs BP (!) 125/91 (BP Location: Right Wrist)   Pulse 85   Temp 98.4 F (36.9 C) (Oral)   Resp 18   Ht 5\' 2"  (1.575 m)   Wt (!) 172.4 kg   LMP 07/12/2019   SpO2 100%   BMI 69.50 kg/m   Physical Exam Vitals and nursing note reviewed.  Constitutional:      Appearance: Normal appearance. She is not ill-appearing or toxic-appearing.  HENT:     Right Ear: Tympanic membrane and ear canal normal.     Left Ear: Tympanic membrane and ear canal normal.     Nose: Nose normal.     Mouth/Throat:      Mouth: Mucous membranes are moist.     Pharynx: Oropharynx is clear. Uvula midline. No pharyngeal swelling, oropharyngeal exudate, posterior oropharyngeal erythema or uvula swelling.     Comments: Dental caries with ttp of the right lower bicuspid and left upper molar.  Teeth are decayed to the gumline.  Mild edema of the surrounding gums to the right lower bicuspid and gums appear friable.   No active bleeding.  No facial edema.  Uvula is midline and no edematous.  No trismus.   Cardiovascular:     Rate and Rhythm: Normal rate and regular rhythm.     Pulses: Normal pulses.  Pulmonary:     Effort: Pulmonary effort is normal. No respiratory distress.     Breath sounds: Normal breath sounds. No wheezing.  Chest:     Chest wall: No tenderness.  Musculoskeletal:        General: Normal range of motion.  Skin:    General: Skin is warm.     Capillary Refill: Capillary refill takes less than 2 seconds.     Findings: No erythema or rash.  Neurological:     General: No focal deficit present.     Mental Status: She is alert.     Sensory: Sensation is intact. No sensory deficit.     Motor: Motor function is intact. No weakness.     Comments: CN II-XII grossly intact     ED Results / Procedures / Treatments   Labs (all labs ordered are listed, but only abnormal results are displayed) Labs Reviewed - No data to display  EKG None  Radiology DG Chest Portable 1 View  Result Date: 08/09/2019 CLINICAL DATA:  Cough EXAM: PORTABLE CHEST 1 VIEW COMPARISON:  08/24/2015 FINDINGS: The heart size and mediastinal contours are within normal limits. Both lungs are clear. The visualized skeletal structures are unremarkable. IMPRESSION: No acute abnormality of the lungs in AP portable projection. Electronically Signed   By: 08/26/2015 M.D.   On: 08/09/2019 17:22    Procedures Procedures (including critical care time)  Medications Ordered in ED Medications - No data to display  ED Course  I have  reviewed the triage vital signs and the nursing notes.  Pertinent labs & imaging results that were available during my care of the patient were reviewed by me and considered in my medical decision making (see chart for details).    MDM Rules/Calculators/A&P  Pt here after having a spontaneous episode of blood in her mouth.  She is unclear of source.  She is currently taking amoxil for dental infection.  No concerning sx's for strep, PTA.  No active bleeding, no reported hemoptysis and CXR is reassuring.  I feel like her gums are the likely source.      Pt also noted to be mildly hypertensive.  Discussed keeping BP log, and close PCP f/u.  Pt agrees to plan.  Return precautions discussed.    Final Clinical Impression(s) / ED Diagnoses Final diagnoses:  Pain, dental  Bleeding gums    Rx / DC Orders ED Discharge Orders    None       Rosey Bath 08/09/19 1929    Mancel Bale, MD 08/09/19 2003

## 2019-08-09 NOTE — ED Triage Notes (Signed)
Pt presents to ED after spitting up bright red blood this afternoon. Pt states she had been cleaning and laid down and then noticed blood. Pt states she don't know if it is coming from her throat or her teeth.

## 2019-09-30 ENCOUNTER — Encounter: Payer: Self-pay | Admitting: Family Medicine

## 2019-09-30 ENCOUNTER — Ambulatory Visit (INDEPENDENT_AMBULATORY_CARE_PROVIDER_SITE_OTHER): Payer: 59 | Admitting: Family Medicine

## 2019-09-30 ENCOUNTER — Other Ambulatory Visit: Payer: Self-pay

## 2019-09-30 VITALS — BP 142/90 | HR 80 | Temp 97.1°F | Resp 15 | Ht 62.0 in | Wt 391.0 lb

## 2019-09-30 DIAGNOSIS — G43809 Other migraine, not intractable, without status migrainosus: Secondary | ICD-10-CM

## 2019-09-30 DIAGNOSIS — Z124 Encounter for screening for malignant neoplasm of cervix: Secondary | ICD-10-CM

## 2019-09-30 DIAGNOSIS — F439 Reaction to severe stress, unspecified: Secondary | ICD-10-CM | POA: Diagnosis not present

## 2019-09-30 DIAGNOSIS — Z6841 Body Mass Index (BMI) 40.0 and over, adult: Secondary | ICD-10-CM

## 2019-09-30 DIAGNOSIS — F321 Major depressive disorder, single episode, moderate: Secondary | ICD-10-CM

## 2019-09-30 DIAGNOSIS — J4520 Mild intermittent asthma, uncomplicated: Secondary | ICD-10-CM | POA: Diagnosis not present

## 2019-09-30 DIAGNOSIS — Z720 Tobacco use: Secondary | ICD-10-CM

## 2019-09-30 DIAGNOSIS — I10 Essential (primary) hypertension: Secondary | ICD-10-CM

## 2019-09-30 MED ORDER — TOPIRAMATE 25 MG PO TABS: 25.0000 mg | ORAL_TABLET | Freq: Every day | ORAL | 1 refills | Status: DC

## 2019-09-30 MED ORDER — RIZATRIPTAN BENZOATE 5 MG PO TABS: 5.0000 mg | ORAL_TABLET | ORAL | 0 refills | Status: DC | PRN

## 2019-09-30 MED ORDER — ALBUTEROL SULFATE HFA 108 (90 BASE) MCG/ACT IN AERS: 1.0000 | INHALATION_SPRAY | Freq: Four times a day (QID) | RESPIRATORY_TRACT | 1 refills | Status: DC | PRN
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
## 2019-09-30 NOTE — Patient Instructions (Signed)
I appreciate the opportunity to provide you with care for your health and wellness. Today we discussed: establish care   Follow up: 3 weeks for migraine and BP check  No labs   Referral to [facility] for GYN   Sign release form for records  Start Topamax daily at your night time  Use Maxalt as needed-only with bad migraines-throbbing and pulsating pain  Ibuprofen 800 mg as start of a dull growing headache   Start reflection on how you can meet yourself in the middle for your care. Spend 5-10 minutes daily on a self care item.   Please continue to practice social distancing to keep you, your family, and our community safe.  If you must go out, please wear a mask and practice good handwashing.  It was a pleasure to see you and I look forward to continuing to work together on your health and well-being. Please do not hesitate to call the office if you need care or have questions about your care.  Have a wonderful day and week. With Gratitude, Tereasa Coop, DNP, AGNP-BC

## 2019-10-01 ENCOUNTER — Encounter: Payer: Self-pay | Admitting: Family Medicine

## 2019-10-01 DIAGNOSIS — G43909 Migraine, unspecified, not intractable, without status migrainosus: Secondary | ICD-10-CM | POA: Insufficient documentation

## 2019-10-01 DIAGNOSIS — F321 Major depressive disorder, single episode, moderate: Secondary | ICD-10-CM | POA: Insufficient documentation

## 2019-10-01 DIAGNOSIS — J4520 Mild intermittent asthma, uncomplicated: Secondary | ICD-10-CM | POA: Insufficient documentation

## 2019-10-01 DIAGNOSIS — Z720 Tobacco use: Secondary | ICD-10-CM | POA: Insufficient documentation

## 2019-10-01 DIAGNOSIS — F439 Reaction to severe stress, unspecified: Secondary | ICD-10-CM | POA: Insufficient documentation

## 2019-10-01 DIAGNOSIS — Z124 Encounter for screening for malignant neoplasm of cervix: Secondary | ICD-10-CM | POA: Insufficient documentation

## 2019-10-01 DIAGNOSIS — I10 Essential (primary) hypertension: Secondary | ICD-10-CM | POA: Insufficient documentation

## 2019-10-01 NOTE — Assessment & Plan Note (Signed)
Referral for behavioral health therapy.  In addition to starting medication at next visit did not want to start several medications at one time.  Provided with education on self-care. Denies having any SI or HI at this time.  Close follow-up.

## 2019-10-01 NOTE — Assessment & Plan Note (Signed)
Is on Lasix intermittently for leg swelling.  Most likely related to weight gain.  Additionally she probably has some changes vascularly but she reports that her blood pressure is normally stable unless her head is hurting.  Will start Topamax to see if we can get her headaches under control.  At next appointment if blood pressure still elevated will consider starting something low-dose for blood pressure.  She is on board with this plan of care.  In the meantime strongly encouraged her to reduce salt intake by following a DASH diet and making sure that she gets at least 30 to 60 minutes of exercise at least 5 days a week if not more.

## 2019-10-01 NOTE — Assessment & Plan Note (Signed)
Starting Topamax.  Will wean up as tolerated.  This will help with migraines in addition to maybe curbing binge eating as she reports that she snacks a lot.  She does think a lot of it is mood related.  And is hoping that when she gets her mood under control she can get her weight under control.  At this time her blood pressure is elevated and she is not a good candidate for phentermine.  She understands this  Deteriorated Jacqueline Collins is educated about the importance of exercise daily to help with weight management. A minumum of 30 minutes daily is recommended. Additionally, importance of healthy food choices  with portion control discussed.   Wt Readings from Last 3 Encounters:  09/30/19 (!) 391 lb (177.4 kg)  08/09/19 (!) 380 lb (172.4 kg)  04/22/19 (!) 382 lb (173.3 kg)

## 2019-10-01 NOTE — Assessment & Plan Note (Signed)
Referral to behavioral health.  Will consider starting medication in the near future.  Did not want to start too many medications at 1 time not being able to tell the side effect especially medications that impact neurotransmitters such as the Topamax and or Wellbutrin or Lexapro. Denies having any SI or HI at this time.  Patient acknowledged agreement and understanding of the plan.

## 2019-10-01 NOTE — Assessment & Plan Note (Signed)
Provided with albuterol.  Strongly encouraged to stop smoking/vaping.

## 2019-10-01 NOTE — Assessment & Plan Note (Signed)
We will start her on Topamax and wean as tolerated to help with migraine and weight.  Additionally started on Maxalt to help with breakthrough headaches.  Encouraged to decrease and reduce to wean off Fioricet use.  Advised to use ibuprofen 800 mg at the start of breakthrough headaches that are just headaches and not migraine related.   Reviewed side effects, risks and benefits of medication.   Patient acknowledged agreement and understanding of the plan.

## 2019-10-01 NOTE — Assessment & Plan Note (Signed)
Asked about quitting: confirms they are currently smokes nicotine vape Advise to quit smoking: Educated about QUITTING to reduce the risk of cancer, cardio and cerebrovascular disease. Assess willingness: Unwilling to quit at this time, but is working on cutting back. Assist with counseling and pharmacotherapy: Counseled for 5 minutes and literature provided. Arrange for follow up: not quitting follow up in 3 months and continue to offer help.

## 2019-10-23 ENCOUNTER — Other Ambulatory Visit: Payer: Self-pay

## 2019-10-23 ENCOUNTER — Encounter: Payer: Self-pay | Admitting: Obstetrics and Gynecology

## 2019-10-23 ENCOUNTER — Ambulatory Visit: Payer: 59 | Admitting: Family Medicine

## 2019-10-23 ENCOUNTER — Ambulatory Visit (INDEPENDENT_AMBULATORY_CARE_PROVIDER_SITE_OTHER): Payer: 59 | Admitting: Obstetrics and Gynecology

## 2019-10-23 ENCOUNTER — Other Ambulatory Visit (HOSPITAL_COMMUNITY)
Admission: RE | Admit: 2019-10-23 | Discharge: 2019-10-23 | Disposition: A | Discharge status: Home / Self Care | Payer: 59 | Source: Ambulatory Visit | Attending: Obstetrics and Gynecology | Admitting: Obstetrics and Gynecology

## 2019-10-23 VITALS — BP 112/79 | HR 80 | Ht 62.0 in | Wt 389.2 lb

## 2019-10-23 DIAGNOSIS — Z01419 Encounter for gynecological examination (general) (routine) without abnormal findings: Secondary | ICD-10-CM | POA: Insufficient documentation

## 2019-10-23 NOTE — Progress Notes (Signed)
PATIENT ID: Jacqueline Collins, female     DOB: 13-Jan-1986, 34 y.o.     MRN: [id number]   [facility] Visit  10/23/19           Patient name: Jacqueline Collins MRN [id number]  Date of birth: 1986-07-02  CC & HPI:  Jacqueline Collins is a 34 y.o. female presenting today for routine Pap smear.   She notes some tenderness around her bilateral nipples, particularly around her periods. She declines nipple discharge or inversion.   She notes that she has been constipated. She has been drinking miralax every day to treat without  Consistent relief.   ROS:  Review of Systems  Constitutional: Negative for diaphoresis, fever, malaise/fatigue and weight loss.  HENT: Negative for congestion and sore throat.   Eyes: Negative for blurred vision and double vision.  Respiratory: Negative for cough and shortness of breath.   Cardiovascular: Negative for chest pain, palpitations and leg swelling.  Gastrointestinal: Negative for constipation, diarrhea, nausea and vomiting.  Genitourinary: Negative for frequency and urgency.  Musculoskeletal: Negative for back pain, falls and myalgias.  Skin: Negative for rash.  Neurological: Negative for dizziness, weakness and headaches.  Psychiatric/Behavioral: Negative for depression. The patient is not nervous/anxious.    Pertinent History Reviewed:  Reviewed: Significant for left axillary lump  Medical         Past Medical History:  Diagnosis Date  . Asthma   . Axillary lump 08/12/2013   Left axillary lump. Referred patient to the [facility] for left breast ultrasound. Appointment scheduled for Tuesday, August 12, 2013 at 0945.   Marland Kitchen Headache   . Obesity                               Surgical Hx:    Past Surgical History:  Procedure Laterality Date  . MOUTH SURGERY     Medications: Reviewed & Updated - see associated section                       Current Outpatient Medications:  .  butalbital-acetaminophen-caffeine (FIORICET) 50-325-40 MG  tablet, Take 1 tablet by mouth every 6 (six) hours as needed., Disp: , Rfl:  .  ciprofloxacin (CIPRO) 250 MG tablet, SMARTSIG:1 Tablet(s) By Mouth Every 12 Hours, Disp: , Rfl:  .  furosemide (LASIX) 20 MG tablet, Take 20 mg by mouth daily as needed., Disp: , Rfl:  .  potassium chloride (KLOR-CON) 10 MEQ tablet, Take 10 mEq by mouth daily., Disp: , Rfl:  .  rizatriptan (MAXALT) 5 MG tablet, Take 1 tablet (5 mg total) by mouth as needed for migraine. May repeat in 2 hours if needed, Disp: 10 tablet, Rfl: 0 .  topiramate (TOPAMAX) 25 MG tablet, Take 1 tablet (25 mg total) by mouth daily., Disp: 30 tablet, Rfl: 1 .  Vitamin D, Ergocalciferol, (DRISDOL) 1.25 MG (50000 UNIT) CAPS capsule, Take 50,000 Units by mouth once a week., Disp: , Rfl:  .  albuterol (VENTOLIN HFA) 108 (90 Base) MCG/ACT inhaler, Inhale 1-2 puffs into the lungs every 6 (six) hours as needed for wheezing or shortness of breath. (Patient not taking: Reported on 10/23/2019), Disp: 8 g, Rfl: 1   Social History: Reviewed -  reports that she has been smoking e-cigarettes. She has never used smokeless tobacco.  Objective Findings:  Vitals: Blood pressure 112/79, pulse 80, height 5\' 2"  (1.575 m), weight (!) 389 lb  3.2 oz (176.5 kg), last menstrual period 09/15/2019.  PHYSICAL EXAMINATION General appearance - alert, well appearing, and in no distress, oriented to person, place, and time, overweight and well hydrated Mental status - alert, oriented to person, place, and time, normal mood, behavior, speech, dress, motor activity, and thought processes, affect appropriate to mood Chest - not examined Heart - not examined Abdomen - not examined Breasts - No masses or nodes.No dimpling, nipple retraction or discharge. Skin - normal coloration and turgor, no rashes, no suspicious skin lesions noted  PELVIC External genitalia - normal  Vulva - normal  Vagina - normal good support  Cervix - tiny, normal in appearance. Well supported.    Uterus - difficult to assess  Adnexa - neg  [place]et [place] - n/a Rectal - normal rectal, no masses, guaiac negative stool obtained, no rectocele   Assessment & Plan:   A:  1.  well woman exam with pap  P:  1.  Routine Pap and physical in 3 years    By signing my name below, I, [employer], attest that this documentation has been prepared under the direction and in the presence of Jonnie Kind, MD. Electronically Signed: [place]. 10/23/19. 1:56 PM.  I personally performed the services described in this documentation, which was SCRIBED in my presence. The recorded information has been reviewed and considered accurate. It has been edited as necessary during review. Jonnie Kind, MD

## 2019-10-28 LAB — CYTOLOGY - PAP
Comment: NEGATIVE
Diagnosis: NEGATIVE
High risk HPV: NEGATIVE

## 2019-11-11 ENCOUNTER — Encounter: Payer: Self-pay | Admitting: Family Medicine

## 2019-11-11 ENCOUNTER — Ambulatory Visit (INDEPENDENT_AMBULATORY_CARE_PROVIDER_SITE_OTHER): Payer: 59 | Admitting: Family Medicine

## 2019-11-11 ENCOUNTER — Other Ambulatory Visit: Payer: Self-pay

## 2019-11-11 DIAGNOSIS — G43809 Other migraine, not intractable, without status migrainosus: Secondary | ICD-10-CM

## 2019-11-11 DIAGNOSIS — Z6841 Body Mass Index (BMI) 40.0 and over, adult: Secondary | ICD-10-CM

## 2019-11-11 DIAGNOSIS — I10 Essential (primary) hypertension: Secondary | ICD-10-CM | POA: Diagnosis not present

## 2019-11-11 MED ORDER — TOPIRAMATE 25 MG PO TABS: ORAL_TABLET | ORAL | 1 refills | Status: DC

## 2019-11-11 MED ORDER — RIZATRIPTAN BENZOATE 5 MG PO TABS: 5.0000 mg | ORAL_TABLET | ORAL | 0 refills | Status: DC | PRN

## 2019-11-11 NOTE — Progress Notes (Signed)
Subjective:  Patient ID: Jacqueline Collins, female    DOB: [birth year]  Age: 34 y.o. MRN: [id number]  CC:  Chief Complaint  Patient presents with  . Obesity    wants to discuss diet information   . Migraine    follow up on topamax- doesn't thinks its working. States she has had the migraines saince childhood and wants scanning of her head.       HPI  HPI  Jacqueline Collins is a 34 year old African-American female who is a fairly recently established patient of mine. She comes in today to follow-up on obesity and would like to discuss information on diets.  Additionally following up for migraine help as she was started on Topamax at the last appointment I saw her and she reports that her headaches got worse.  And she stopped taking it especially because it might cause memory trouble.  She reports she has been taking the Maxalt though and that helps very much.  And she needs a refill on it.  She also wants to have her head scanned as she reports she has had headaches since she was little and wants to make sure there is nothing wrong.   She has a big concern on taking blood pressure medicine when asked if blood pressure could be the cause of her headaches.  She like to refrain from taking anything that she may have to take regularly for the rest of her life.  Though she does want to get better control over her headaches.  And she does not want to form any kidney disease as her brother has had this.  Today patient denies signs and symptoms of COVID 19 infection including fever, chills, cough, shortness of breath, and headache. Past Medical, Surgical, Social History, Allergies, and Medications have been Reviewed.   Past Medical History:  Diagnosis Date  . Asthma   . Axillary lump 08/12/2013   Left axillary lump. [facility]erred patient to the [facility] for left breast ultrasound. Appointment scheduled for Tuesday, August 12, 2013 at 0945.   Marland Kitchen Headache   . Obesity     Current Meds    Medication Sig  . albuterol (VENTOLIN HFA) 108 (90 Base) MCG/ACT inhaler Inhale 1-2 puffs into the lungs every 6 (six) hours as needed for wheezing or shortness of breath.  . ciprofloxacin (CIPRO) 250 MG tablet SMARTSIG:1 Tablet(s) By Mouth Every 12 Hours  . furosemide (LASIX) 20 MG tablet Take 20 mg by mouth daily as needed.  . potassium chloride (KLOR-CON) 10 MEQ tablet Take 10 mEq by mouth daily.  . rizatriptan (MAXALT) 5 MG tablet Take 1 tablet (5 mg total) by mouth as needed for migraine. May repeat in 2 hours if needed  . topiramate (TOPAMAX) 25 MG tablet Take 1 tablet (25 mg total) by mouth daily for 7 days, THEN 2 tablets (50 mg total) daily for 7 days, THEN 3 tablets (75 mg total) daily for 7 days, THEN 4 tablets (100 mg total) daily for 7 days.  . Vitamin D, Ergocalciferol, (DRISDOL) 1.25 MG (50000 UNIT) CAPS capsule Take 50,000 Units by mouth once a week.  . [DISCONTINUED] butalbital-acetaminophen-caffeine (FIORICET) 50-325-40 MG tablet Take 1 tablet by mouth every 6 (six) hours as needed.  . [DISCONTINUED] rizatriptan (MAXALT) 5 MG tablet Take 1 tablet (5 mg total) by mouth as needed for migraine. May repeat in 2 hours if needed  . [DISCONTINUED] topiramate (TOPAMAX) 25 MG tablet Take 1 tablet (25 mg total) by mouth  daily.    ROS:  Review of Systems  HENT: Negative.   Eyes: Negative.   Respiratory: Negative.   Cardiovascular: Negative.   Gastrointestinal: Positive for constipation.  Genitourinary: Negative.   Musculoskeletal: Negative.   Skin: Negative.   Neurological: Positive for headaches.  Endo/Heme/Allergies: Negative.   Psychiatric/Behavioral: Negative.   All other systems reviewed and are negative.    Objective:   Today's Vitals: BP 118/82   Pulse 82   Temp 97.9 F (36.6 C) (Temporal)   Resp 15   Ht 5\' 2"  (1.575 m)   Wt (!) 388 lb (176 kg)   SpO2 98%   BMI 70.97 kg/m  Vitals with BMI 11/11/2019 10/23/2019 09/30/2019  Height 5\' 2"  5\' 2"  5\' 2"   Weight 388  lbs 389 lbs 3 oz 391 lbs  BMI 70.95 37.62 83.1  Systolic 517 616 073  Diastolic 82 79 90  Pulse 82 80 80     Physical Exam Vitals and nursing note reviewed.  Constitutional:      Appearance: Normal appearance. She is well-developed and well-groomed. She is morbidly obese.  HENT:     Head: Normocephalic and atraumatic.     Right Ear: External ear normal.     Left Ear: External ear normal.     Nose: Nose normal.     Mouth/Throat:     Mouth: Mucous membranes are moist.     Pharynx: Oropharynx is clear.     Comments: Mask in place Eyes:     General:        Right eye: No discharge.        Left eye: No discharge.     Conjunctiva/sclera: Conjunctivae normal.  Cardiovascular:     Rate and Rhythm: Normal rate and regular rhythm.     Pulses: Normal pulses.     Heart sounds: Normal heart sounds.  Pulmonary:     Effort: Pulmonary effort is normal.     Breath sounds: Normal breath sounds.  Musculoskeletal:        General: Normal range of motion.     Cervical back: Normal range of motion and neck supple.  Skin:    General: Skin is warm.  Neurological:     General: No focal deficit present.     Mental Status: She is alert and oriented to person, place, and time.  Psychiatric:        Attention and Perception: Attention normal.        Mood and Affect: Mood is anxious.        Speech: Speech normal.        Behavior: Behavior normal. Behavior is cooperative.        Thought Content: Thought content normal.        Cognition and Memory: Cognition normal.        Judgment: Judgment normal.     Assessment   1. Morbid obesity with body mass index of 70 and over in adult [facility])   2. Other migraine without status migrainosus, not intractable   3. Essential hypertension     Tests ordered Orders Placed This Encounter  Procedures  . CBC  . COMPLETE METABOLIC PANEL WITH GFR  . Ambulatory referral to Neurology     Plan: Please see assessment and plan per problem list above.   Meds  ordered this encounter  Medications  . topiramate (TOPAMAX) 25 MG tablet    Sig: Take 1 tablet (25 mg total) by mouth daily for 7 days, THEN 2 tablets (50  mg total) daily for 7 days, THEN 3 tablets (75 mg total) daily for 7 days, THEN 4 tablets (100 mg total) daily for 7 days.    Dispense:  30 tablet    Refill:  1    Order Specific Question:   Supervising Provider    Answer:   SIMPSON, MARGARET E [[id number]]  . rizatriptan (MAXALT) 5 MG tablet    Sig: Take 1 tablet (5 mg total) by mouth as needed for migraine. May repeat in 2 hours if needed    Dispense:  10 tablet    Refill:  0    Order Specific Question:   Supervising Provider    Answer:   Kerri Perches [[id number]]    Patient to follow-up in 4 weeks.  Freddy Finner, NP

## 2019-11-11 NOTE — Patient Instructions (Addendum)
I appreciate the opportunity to provide you with care for your health and wellness. Today we discussed: headahces  Follow up: 4 weeks headache and wt check  Labs and referrals today-neurology   Please take medications has ordered. Call if you have problems with the medication.   Please continue to practice social distancing to keep you, your family, and our community safe.  If you must go out, please wear a mask and practice good handwashing.  It was a pleasure to see you and I look forward to continuing to work together on your health and well-being. Please do not hesitate to call the office if you need care or have questions about your care.  Have a wonderful day and week. With Gratitude, Tereasa Coop, DNP, AGNP-BC

## 2019-11-11 NOTE — Assessment & Plan Note (Signed)
We will do a Topamax with a wean up.  Worried about her being a good candidate for phentermine secondary to blood pressure.  Also would like her to try to work on her eating habits to see if she can eat more consistently and avoid snacking.  However if the Topamax does provide benefit of weight loss and management of headaches we will continue if she tolerates the higher dose.  Patient acknowledged agreement and understanding of the plan.

## 2019-11-11 NOTE — Assessment & Plan Note (Signed)
Continues to be on the Lasix however she is worried that Lasix might damage her kidneys.  She reports that when she does not take it she does get leg swelling.  Reports that she takes it regularly.  She appears to be more prehypertensive today than hypertensive as she was last time.  Discussion with whether or not her headaches are driven from spiking blood pressures discussed today and she declined wanting to go on any blood pressure medication to see if that would benefit her.  She is more than willing to try the Topamax again to see if that can help get her blood pressures headaches under control.  If they do not she will consider revisiting whether or not the blood pressure could be a component.  She is encouraged to follow DASH diet.  And to get 30 to 60 minutes of exercise release 5 days of the week if not more. Patient acknowledged agreement and understanding of the plan.

## 2019-11-11 NOTE — Assessment & Plan Note (Signed)
She stopped taking the Topamax because she was afraid of the side effects of memory loss.  As well she thinks that her headaches were getting worse with it.  She reports that the Maxalt was working very well for her.  She is willing to try a wean of the Topamax to get to a higher dose.  I have encouraged her to use ibuprofen at the start of breakthrough headaches that are not migraine related.  And to use the Maxalt for only migraine related headaches.  Patient acknowledged agreement and understanding of the plan.  Additionally I have educated her on the fact that if she goes up on this medication that she will need to be tapered down.  She verbalized understanding of that and reports that she will not stop it abruptly

## 2019-11-25 ENCOUNTER — Encounter (HOSPITAL_COMMUNITY): Payer: Self-pay

## 2019-11-25 ENCOUNTER — Emergency Department (HOSPITAL_COMMUNITY)
Admission: EM | Admit: 2019-11-25 | Discharge: 2019-11-25 | Disposition: A | Discharge status: Home / Self Care | Payer: 59 | Attending: Emergency Medicine | Admitting: Emergency Medicine

## 2019-11-25 ENCOUNTER — Other Ambulatory Visit: Payer: Self-pay

## 2019-11-25 ENCOUNTER — Emergency Department (HOSPITAL_COMMUNITY): Payer: 59

## 2019-11-25 ENCOUNTER — Telehealth: Payer: Self-pay | Admitting: *Deleted

## 2019-11-25 DIAGNOSIS — F17290 Nicotine dependence, other tobacco product, uncomplicated: Secondary | ICD-10-CM | POA: Insufficient documentation

## 2019-11-25 DIAGNOSIS — R202 Paresthesia of skin: Secondary | ICD-10-CM | POA: Diagnosis not present

## 2019-11-25 DIAGNOSIS — R200 Anesthesia of skin: Secondary | ICD-10-CM

## 2019-11-25 DIAGNOSIS — I10 Essential (primary) hypertension: Secondary | ICD-10-CM | POA: Diagnosis not present

## 2019-11-25 DIAGNOSIS — J45909 Unspecified asthma, uncomplicated: Secondary | ICD-10-CM | POA: Diagnosis not present

## 2019-11-25 DIAGNOSIS — R079 Chest pain, unspecified: Secondary | ICD-10-CM

## 2019-11-25 DIAGNOSIS — R0789 Other chest pain: Secondary | ICD-10-CM | POA: Insufficient documentation

## 2019-11-25 DIAGNOSIS — Z79899 Other long term (current) drug therapy: Secondary | ICD-10-CM | POA: Insufficient documentation

## 2019-11-25 LAB — TROPONIN I (HIGH SENSITIVITY)
Troponin I (High Sensitivity): <2 ng/L (ref <18)
Troponin I (High Sensitivity): <2 ng/L (ref <18)

## 2019-11-25 LAB — CBC
HCT: 39.8 % (ref 36.0–46.0)
Hemoglobin: 12.8 g/dL (ref 12.0–15.0)
MCH: 31.4 pg (ref 26.0–34.0)
MCHC: 32.2 g/dL (ref 30.0–36.0)
MCV: 97.5 fL (ref 80.0–100.0)
Platelets: 335 10*3/uL (ref 150–400)
RBC: 4.08 MIL/uL (ref 3.87–5.11)
RDW: 14.2 % (ref 11.5–15.5)
WBC: 9.6 10*3/uL (ref 4.0–10.5)
nRBC: 0 % (ref 0.0–0.2)

## 2019-11-25 LAB — BASIC METABOLIC PANEL
Anion gap: 10 (ref 5–15)
BUN: 10 mg/dL (ref 6–20)
CO2: 23 mmol/L (ref 22–32)
Calcium: 8.8 mg/dL — ABNORMAL LOW (ref 8.9–10.3)
Chloride: 104 mmol/L (ref 98–111)
Creatinine, Ser: 0.81 mg/dL (ref 0.44–1.00)
GFR calc Af Amer: >60 mL/min (ref >60)
GFR calc non Af Amer: >60 mL/min (ref >60)
Glucose, Bld: 126 mg/dL — ABNORMAL HIGH (ref 70–99)
Potassium: 3.5 mmol/L (ref 3.5–5.1)
Sodium: 137 mmol/L (ref 135–145)

## 2019-11-25 LAB — D-DIMER, QUANTITATIVE: D-Dimer, Quant: <0.27 ug/mL-FEU (ref 0.00–0.50)

## 2019-11-25 MED ORDER — ACETAMINOPHEN ER 650 MG PO TBCR
650.0000 mg | EXTENDED_RELEASE_TABLET | Freq: Three times a day (TID) | ORAL | 0 refills | Status: DC | PRN
Start: 2019-11-25 — End: 2019-12-09

## 2019-11-25 MED ORDER — SODIUM CHLORIDE 0.9% FLUSH: 3.0000 mL | Freq: Once | INTRAVENOUS | Status: DC

## 2019-11-25 NOTE — ED Provider Notes (Signed)
[facility] EMERGENCY DEPARTMENT Provider Note   CSN: [id number] Arrival date & time: 11/25/19  1709     History Chief Complaint  Patient presents with  . Pleurisy    Jacqueline Collins is a 34 y.o. female with a past medical history significant for asthma and history of migraine headaches who presents to the ED due to intermittent episodes of left sided, sharp chest pain for the past 3 days.  Patient notes chest pain is typically worse when she feels "overwhelmed".  No relationship to exertion or positional changes.  Patient notes sharp pain occurs for a few seconds and completely goes away.  Chest pain is nonradiating in nature.  Denies history of blood clots, recent surgeries, recent long immobilizations, and hormonal treatments. Admits to chronic lower extremity edema that has not worsened over the past few days. she admits to chronic shortness of breath with no relation to chest pain.  Patient also admits to 3 days of constant left arm/hand numbness/tingling.  She notes the numbness and tingling radiates from dorsal aspect of left hand to middle of forearm. Denies neck pain and neck injury. Denies associated weakness.   History obtained from patient and past medical records. No interpreter used during encounter.    Past Medical History:  Diagnosis Date  . Asthma   . Axillary lump 08/12/2013   Left axillary lump. Referred patient to the [facility] for left breast ultrasound. Appointment scheduled for Tuesday, August 12, 2013 at 0945.   Marland Kitchen Headache   . Obesity     Patient Active Problem List   Diagnosis Date Noted  . Depression, major, single episode, moderate (HCC) 10/01/2019  . Stress 10/01/2019  . Migraine 10/01/2019  . Mild intermittent asthma without complication 10/01/2019  . Encounter for screening for malignant neoplasm of cervix 10/01/2019  . Nicotine abuse 10/01/2019  . Morbid obesity with body mass index of 70 and over in adult [facility]) 10/01/2019  .  Essential hypertension 10/01/2019    Past Surgical History:  Procedure Laterality Date  . MOUTH SURGERY       OB History    Gravida  0   Para      Term      Preterm      AB      Living        SAB      TAB      Ectopic      Multiple      Live Births              Family History  Problem Relation Age of Onset  . Hypertension Mother   . Diabetes Mother   . Obesity Mother   . Kidney disease Brother   . Other Brother        kidney transplant    Social History   Tobacco Use  . Smoking status: Current Every Day Smoker    Types: E-cigarettes  . Smokeless tobacco: Never Used  . Tobacco comment: occasional vaping  Substance Use Topics  . Alcohol use: Yes    Comment: occ  . Drug use: No    Home Medications Prior to Admission medications   Medication Sig Start Date End Date Taking? Authorizing Provider  acetaminophen (TYLENOL 8 HOUR) 650 MG CR tablet Take 1 tablet (650 mg total) by mouth every 8 (eight) hours as needed for pain. 11/25/19   Mannie Stabile, PA-C  albuterol (VENTOLIN HFA) 108 (90 Base) MCG/ACT inhaler Inhale 1-2 puffs into  the lungs every 6 (six) hours as needed for wheezing or shortness of breath. 09/30/19   Freddy Finner, NP  ciprofloxacin (CIPRO) 250 MG tablet SMARTSIG:1 Tablet(s) By Mouth Every 12 Hours 09/16/19   [provider]  furosemide (LASIX) 20 MG tablet Take 20 mg by mouth daily as needed. 09/22/19   [provider]  potassium chloride (KLOR-CON) 10 MEQ tablet Take 10 mEq by mouth daily. 09/22/19   [provider]  rizatriptan (MAXALT) 5 MG tablet Take 1 tablet (5 mg total) by mouth as needed for migraine. May repeat in 2 hours if needed 11/11/19   Freddy Finner, NP  topiramate (TOPAMAX) 25 MG tablet Take 1 tablet (25 mg total) by mouth daily for 7 days, THEN 2 tablets (50 mg total) daily for 7 days, THEN 3 tablets (75 mg total) daily for 7 days, THEN 4 tablets (100 mg total) daily for 7 days. 11/11/19  12/09/19  Freddy Finner, NP  Vitamin D, Ergocalciferol, (DRISDOL) 1.25 MG (50000 UNIT) CAPS capsule Take 50,000 Units by mouth once a week. 09/02/19   [provider]    Allergies    Patient has no known allergies.  Review of Systems   Review of Systems  Constitutional: Negative for chills, diaphoresis and fever.  Respiratory: Positive for shortness of breath (chronic).   Cardiovascular: Positive for chest pain and leg swelling (chronic).  Gastrointestinal: Negative for abdominal pain, diarrhea, nausea and vomiting.  Genitourinary: Negative for dysuria.  Neurological: Positive for numbness.  All other systems reviewed and are negative.   Physical Exam Updated Vital Signs BP (!) 125/93   Pulse (!) 101   Temp (!) 97 F (36.1 C) (Temporal)   Resp 18   Ht 5\' 2"  (1.575 m)   Wt (!) 176 kg   LMP 10/27/2019   SpO2 100%   BMI 70.97 kg/m   Physical Exam Vitals and nursing note reviewed.  Constitutional:      General: She is not in acute distress.    Appearance: She is not ill-appearing.  HENT:     Head: Normocephalic.  Eyes:     Pupils: Pupils are equal, round, and reactive to light.  Neck:     Comments: No cervical midline tenderness.  Cardiovascular:     Rate and Rhythm: Normal rate and regular rhythm.     Pulses: Normal pulses.     Heart sounds: Normal heart sounds. No murmur. No friction rub. No gallop.   Pulmonary:     Effort: Pulmonary effort is normal.     Breath sounds: Normal breath sounds.  Chest:     Comments: Reproducible anterior chest wall tenderness. Abdominal:     General: Abdomen is flat. Bowel sounds are normal. There is no distension.     Palpations: Abdomen is soft.     Tenderness: There is no abdominal tenderness. There is no guarding or rebound.  Musculoskeletal:     Cervical back: Neck supple.     Comments: Mild trace edema of bilateral lower extremities. LUE neurovascularly intact with soft compartments. Equal grip strength.   Skin:     General: Skin is warm and dry.  Neurological:     General: No focal deficit present.     Mental Status: She is alert.  Psychiatric:        Behavior: Behavior normal.     ED Results / Procedures / Treatments   Labs (all labs ordered are listed, but only abnormal results are displayed) Labs Reviewed  BASIC METABOLIC PANEL - Abnormal; Notable for the following components:      Result Value   Glucose, Bld 126 (*)    Calcium 8.8 (*)    All other components within normal limits  CBC  D-DIMER, QUANTITATIVE (NOT AT [facility])  TROPONIN I (HIGH SENSITIVITY)  TROPONIN I (HIGH SENSITIVITY)    EKG EKG Interpretation  Date/Time:  Tuesday Nov 25 2019 17:35:54 EDT Ventricular Rate:  90 PR Interval:  162 QRS Duration: 70 QT Interval:  358 QTC Calculation: 437 R Axis:   8 Text Interpretation: Normal sinus rhythm Normal ECG No significant change since last tracing Confirmed by Dorie Rank [phone]) on 11/25/2019 6:01:26 PM   Radiology DG Chest 2 View  Result Date: 11/25/2019 CLINICAL DATA:  Chest pain radiating to left arm for 3 days. EXAM: CHEST - 2 VIEW COMPARISON:  08/09/2019 FINDINGS: The heart size and mediastinal contours are within normal limits. Both lungs are clear. The visualized skeletal structures are unremarkable. IMPRESSION: Negative.  No active cardiopulmonary disease. Electronically Signed   By: Marlaine Hind M.D.   On: 11/25/2019 18:58    Procedures Procedures (including critical care time)  Medications Ordered in ED Medications  sodium chloride flush (NS) 0.9 % injection 3 mL (0 mLs Intravenous Hold 11/25/19 2006)    ED Course  I have reviewed the triage vital signs and the nursing notes.  Pertinent labs & imaging results that were available during my care of the patient were reviewed by me and considered in my medical decision making (see chart for details).  Clinical Course as of Nov 25 2134  Tue Nov 25, 2019  2021 Pulse Rate(!): 101 [CA]  2114 D-Dimer, Quant: <0.27  [CA]  2126 Troponin I (High Sensitivity): <2 [CA]    Clinical Course User Index [CA] Suzy Bouchard, PA-C   MDM Rules/Calculators/A&P HEAR Score: 1                    34 year old female presents the ED due to left-sided, sharp, nonradiating chest pain that has been intermittent for the past 3 days.  She also admits to 3 days of left arm which occurs independently of the chest pain.  Upon arrival, patient tachycardic at 101, but otherwise reassuring vitals.  Patient in no acute distress and nontoxic-appearing.  Physical exam significant for reproducible anterior chest wall tenderness.  Trace lower extremity edema.  Left upper extremity neurovascularly intact with soft compartments. Equal grip strength. Cardiac labs ordered at triage. Will add d-dimer given her tachycardia. Suspect chest pain related to MSK etiology given reproducible nature on exam. Suspect numbness/tingling related to carpal tunnel vs. ulna nerve impingement vs. Cervical radiculopathy. No concern for central cord compression given there is no weakness on exam. Will discharge patient with orthopedic follow-up for further evaluation.   ED unremarkable no leukocytosis and normal hemoglobin.  BMP reassuring with normal renal function no major electrolyte derangements.  Initial troponin normal.  Will obtain delta troponin to rule out ACS.  Chest x-ray personally reviewed which is negative for any signs of pneumonia, pneumothorax, or widened mediastinum.  D-dimer normal.  Doubt DVT/PE.  EKG personally reviewed which demonstrates normal sinus rhythm with no signs of acute ischemia.  Delta troponin flat. Low suspicion for ACS. Suspect chest pain related to MSK etiology given reproducible nature on exam. Will discharge patient with symptomatic treatment. Advised patient to follow-up with PCP if symptoms do not improve over the next week. Orthopedics number given to patient at discharge  for LUE numbness/tingling. Strict ED precautions  discussed with patient. Patient states understanding and agrees to plan. Patient discharged home in no acute distress and stable vitals  Final Clinical Impression(s) / ED Diagnoses Final diagnoses:  Nonspecific chest pain  Numbness and tingling in left arm    Rx / DC Orders ED Discharge Orders         Ordered    acetaminophen (TYLENOL 8 HOUR) 650 MG CR tablet  Every 8 hours PRN     11/25/19 2123           Jesusita Oka 11/25/19 2136    Linwood Dibbles, MD 11/26/19 2022

## 2019-11-25 NOTE — Telephone Encounter (Signed)
Pt called with chest pain and left arm tingling wanting to make an appt advised pt with tingling and chest pain she needed to go to closest er with verbal understanding

## 2019-11-25 NOTE — ED Triage Notes (Signed)
Pt c/o left sided sharp chest pain intermittently x3 days. Pt also c/o constant left arm numbness and tingling. Sent to ER by PCP for evaluation of sx.

## 2019-11-25 NOTE — Discharge Instructions (Addendum)
As discussed, all of your labs were reassuring today. I suspect your chest pain is musculoskeletal related pain. I am sending you home with tylenol. Take as prescribed for pain. I have included the number of an orthopedic doctor for further evaluation of your left arm numbness/tingling. Please call if symptoms do not improve within the next week. Return to the ER for new or worsening symptoms.

## 2019-11-25 NOTE — ED Notes (Signed)
Pt reports "months" of intermittent CP   Here for eval   EKG= Nor  Labs returned

## 2019-11-29 NOTE — Telephone Encounter (Signed)
This encounter was created in error - please disregard.

## 2019-12-09 ENCOUNTER — Ambulatory Visit: Payer: 59 | Admitting: Family Medicine

## 2019-12-09 ENCOUNTER — Encounter: Payer: Self-pay | Admitting: Family Medicine

## 2019-12-09 ENCOUNTER — Other Ambulatory Visit: Payer: Self-pay

## 2019-12-09 ENCOUNTER — Ambulatory Visit (INDEPENDENT_AMBULATORY_CARE_PROVIDER_SITE_OTHER): Payer: 59 | Admitting: Family Medicine

## 2019-12-09 DIAGNOSIS — Z6841 Body Mass Index (BMI) 40.0 and over, adult: Secondary | ICD-10-CM | POA: Diagnosis not present

## 2019-12-09 MED ORDER — PHENTERMINE HCL 37.5 MG PO TABS: 37.5000 mg | ORAL_TABLET | Freq: Every day | ORAL | 0 refills | Status: DC

## 2019-12-09 NOTE — Patient Instructions (Addendum)
I appreciate the opportunity to provide you with care for your health and wellness. Today we discussed: weight loss   Follow up: 4 weeks   No labs or referrals today  Topamax: Take every other day for a week then stop  Start Phentermine 2-3 days after stopping the Topamax    Call if headaches reoccur or are giving trouble  Weight loss:  It is a mindset for lifestyle changes. Take your time adjusting so you don't burn out.  Increase water to 2 liters daily Increase fiber and protein intake   Please continue to practice social distancing to keep you, your family, and our community safe.  If you must go out, please wear a mask and practice good handwashing.  It was a pleasure to see you and I look forward to continuing to work together on your health and well-being. Please do not hesitate to call the office if you need care or have questions about your care.  Have a wonderful day and week. With Gratitude, Jacqueline Beach, DNP, AGNP-BC   Calorie Counting for Weight Loss Calories are units of energy. Your body needs a certain amount of calories from food to keep you going throughout the day. When you eat more calories than your body needs, your body stores the extra calories as fat. When you eat fewer calories than your body needs, your body burns fat to get the energy it needs. Calorie counting means keeping track of how many calories you eat and drink each day. Calorie counting can be helpful if you need to lose weight. If you make sure to eat fewer calories than your body needs, you should lose weight. Ask your health care provider what a healthy weight is for you. For calorie counting to work, you will need to eat the right number of calories in a day in order to lose a healthy amount of weight per week. A dietitian can help you determine how many calories you need in a day and will give you suggestions on how to reach your calorie goal.  A healthy amount of weight to lose per week is  usually 1-2 lb (0.5-0.9 kg). This usually means that your daily calorie intake should be reduced by 500-750 calories.  Eating 1,200 - 1,500 calories per day can help most women lose weight.  Eating 1,500 - 1,800 calories per day can help most men lose weight. What is my plan? My goal is to have __________ calories per day. If I have this many calories per day, I should lose around __________ pounds per week. What do I need to know about calorie counting? In order to meet your daily calorie goal, you will need to:  Find out how many calories are in each food you would like to eat. Try to do this before you eat.  Decide how much of the food you plan to eat.  Write down what you ate and how many calories it had. Doing this is called keeping a food log. To successfully lose weight, it is important to balance calorie counting with a healthy lifestyle that includes regular activity. Aim for 150 minutes of moderate exercise (such as walking) or 75 minutes of vigorous exercise (such as running) each week. Where do I find calorie information?  The number of calories in a food can be found on a Nutrition Facts label. If a food does not have a Nutrition Facts label, try to look up the calories online or ask your dietitian for help.  Remember that calories are listed per serving. If you choose to have more than one serving of a food, you will have to multiply the calories per serving by the amount of servings you plan to eat. For example, the label on a package of bread might say that a serving size is 1 slice and that there are 90 calories in a serving. If you eat 1 slice, you will have eaten 90 calories. If you eat 2 slices, you will have eaten 180 calories. How do I keep a food log? Immediately after each meal, record the following information in your food log:  What you ate. Don't forget to include toppings, sauces, and other extras on the food.  How much you ate. This can be measured in cups,  ounces, or number of items.  How many calories each food and drink had.  The total number of calories in the meal. Keep your food log near you, such as in a small notebook in your pocket, or use a mobile app or website. Some programs will calculate calories for you and show you how many calories you have left for the day to meet your goal. What are some calorie counting tips?   Use your calories on foods and drinks that will fill you up and not leave you hungry: ? Some examples of foods that fill you up are nuts and nut butters, vegetables, lean proteins, and high-fiber foods like whole grains. High-fiber foods are foods with more than 5 g fiber per serving. ? Drinks such as sodas, specialty coffee drinks, alcohol, and juices have a lot of calories, yet do not fill you up.  Eat nutritious foods and avoid empty calories. Empty calories are calories you get from foods or beverages that do not have many vitamins or protein, such as candy, sweets, and soda. It is better to have a nutritious high-calorie food (such as an avocado) than a food with few nutrients (such as a bag of chips).  Know how many calories are in the foods you eat most often. This will help you calculate calorie counts faster.  Pay attention to calories in drinks. Low-calorie drinks include water and unsweetened drinks.  Pay attention to nutrition labels for "low fat" or "fat free" foods. These foods sometimes have the same amount of calories or more calories than the full fat versions. They also often have added sugar, starch, or salt, to make up for flavor that was removed with the fat.  Find a way of tracking calories that works for you. Get creative. Try different apps or programs if writing down calories does not work for you. What are some portion control tips?  Know how many calories are in a serving. This will help you know how many servings of a certain food you can have.  Use a measuring cup to measure serving  sizes. You could also try weighing out portions on a kitchen scale. With time, you will be able to estimate serving sizes for some foods.  Take some time to put servings of different foods on your favorite plates, bowls, and cups so you know what a serving looks like.  Try not to eat straight from a bag or box. Doing this can lead to overeating. Put the amount you would like to eat in a cup or on a plate to make sure you are eating the right portion.  Use smaller plates, glasses, and bowls to prevent overeating.  Try not to multitask (for example, watch  TV or use your computer) while eating. If it is time to eat, sit down at a table and enjoy your food. This will help you to know when you are full. It will also help you to be aware of what you are eating and how much you are eating. What are tips for following this plan? Reading food labels  Check the calorie count compared to the serving size. The serving size may be smaller than what you are used to eating.  Check the source of the calories. Make sure the food you are eating is high in vitamins and protein and low in saturated and trans fats. Shopping  Read nutrition labels while you shop. This will help you make healthy decisions before you decide to purchase your food.  Make a grocery list and stick to it. Cooking  Try to cook your favorite foods in a healthier way. For example, try baking instead of frying.  Use low-fat dairy products. Meal planning  Use more fruits and vegetables. Half of your plate should be fruits and vegetables.  Include lean proteins like poultry and fish. How do I count calories when eating out?  Ask for smaller portion sizes.  Consider sharing an entree and sides instead of getting your own entree.  If you get your own entree, eat only half. Ask for a box at the beginning of your meal and put the rest of your entree in it so you are not tempted to eat it.  If calories are listed on the menu, choose  the lower calorie options.  Choose dishes that include vegetables, fruits, whole grains, low-fat dairy products, and lean protein.  Choose items that are boiled, broiled, grilled, or steamed. Stay away from items that are buttered, battered, fried, or served with cream sauce. Items labeled "crispy" are usually fried, unless stated otherwise.  Choose water, low-fat milk, unsweetened iced tea, or other drinks without added sugar. If you want an alcoholic beverage, choose a lower calorie option such as a glass of wine or light beer.  Ask for dressings, sauces, and syrups on the side. These are usually high in calories, so you should limit the amount you eat.  If you want a salad, choose a garden salad and ask for grilled meats. Avoid extra toppings like bacon, cheese, or fried items. Ask for the dressing on the side, or ask for olive oil and vinegar or lemon to use as dressing.  Estimate how many servings of a food you are given. For example, a serving of cooked rice is  cup or about the size of half a baseball. Knowing serving sizes will help you be aware of how much food you are eating at restaurants. The list below tells you how big or small some common portion sizes are based on everyday objects: ? 1 oz--4 stacked dice. ? 3 oz--1 deck of cards. ? 1 tsp--1 die. ? 1 Tbsp-- a ping-pong ball. ? 2 Tbsp--1 ping-pong ball. ?  cup-- baseball. ? 1 cup--1 baseball. Summary  Calorie counting means keeping track of how many calories you eat and drink each day. If you eat fewer calories than your body needs, you should lose weight.  A healthy amount of weight to lose per week is usually 1-2 lb (0.5-0.9 kg). This usually means reducing your daily calorie intake by 500-750 calories.  The number of calories in a food can be found on a Nutrition Facts label. If a food does not have a Nutrition Facts label,  try to look up the calories online or ask your dietitian for help.  Use your calories on foods  and drinks that will fill you up, and not on foods and drinks that will leave you hungry.  Use smaller plates, glasses, and bowls to prevent overeating. This information is not intended to replace advice given to you by your health care provider. Make sure you discuss any questions you have with your health care provider. Document Revised: 03/08/2018 Document Reviewed: 05/19/2016 Elsevier Patient Education  2020 [employer].    Exercising to Lose Weight Exercise is structured, repetitive physical activity to improve fitness and health. Getting regular exercise is important for everyone. It is especially important if you are overweight. Being overweight increases your risk of heart disease, stroke, diabetes, high blood pressure, and several types of cancer. Reducing your calorie intake and exercising can help you lose weight. Exercise is usually categorized as moderate or vigorous intensity. To lose weight, most people need to do a certain amount of moderate-intensity or vigorous-intensity exercise each week. Moderate-intensity exercise  Moderate-intensity exercise is any activity that gets you moving enough to burn at least three times more energy (calories) than if you were sitting. Examples of moderate exercise include:  Walking a mile in 15 minutes.  Doing light yard work.  Biking at an easy pace. Most people should get at least 150 minutes (2 hours and 30 minutes) a week of moderate-intensity exercise to maintain their body weight. Vigorous-intensity exercise Vigorous-intensity exercise is any activity that gets you moving enough to burn at least six times more calories than if you were sitting. When you exercise at this intensity, you should be working hard enough that you are not able to carry on a conversation. Examples of vigorous exercise include:  Running.  Playing a team sport, such as football, basketball, and soccer.  Jumping rope. Most people should get at least 75  minutes (1 hour and 15 minutes) a week of vigorous-intensity exercise to maintain their body weight. How can exercise affect me? When you exercise enough to burn more calories than you eat, you lose weight. Exercise also reduces body fat and builds muscle. The more muscle you have, the more calories you burn. Exercise also:  Improves mood.  Reduces stress and tension.  Improves your overall fitness, flexibility, and endurance.  Increases bone strength. The amount of exercise you need to lose weight depends on:  Your age.  The type of exercise.  Any health conditions you have.  Your overall physical ability. Talk to your health care provider about how much exercise you need and what types of activities are safe for you. What actions can I take to lose weight? Nutrition   Make changes to your diet as told by your health care provider or diet and nutrition specialist (dietitian). This may include: ? Eating fewer calories. ? Eating more protein. ? Eating less unhealthy fats. ? Eating a diet that includes fresh fruits and vegetables, whole grains, low-fat dairy products, and lean protein. ? Avoiding foods with added fat, salt, and sugar.  Drink plenty of water while you exercise to prevent dehydration or heat stroke. Activity  Choose an activity that you enjoy and set realistic goals. Your health care provider can help you make an exercise plan that works for you.  Exercise at a moderate or vigorous intensity most days of the week. ? The intensity of exercise may vary from person to person. You can tell how intense a workout is for  you by paying attention to your breathing and heartbeat. Most people will notice their breathing and heartbeat get faster with more intense exercise.  Do resistance training twice each week, such as: ? Push-ups. ? Sit-ups. ? Lifting weights. ? Using resistance bands.  Getting short amounts of exercise can be just as helpful as long structured  periods of exercise. If you have trouble finding time to exercise, try to include exercise in your daily routine. ? Get up, stretch, and walk around every 30 minutes throughout the day. ? Go for a walk during your lunch break. ? Park your car farther away from your destination. ? If you take public transportation, get off one stop early and walk the rest of the way. ? Make phone calls while standing up and walking around. ? Take the stairs instead of elevators or escalators.  Wear comfortable clothes and shoes with good support.  Do not exercise so much that you hurt yourself, feel dizzy, or get very short of breath. Where to find more information  [place] Department of Health and Human Services: ThisPath.fi  Centers for Disease Control and Prevention (CDC): FootballExhibition.com.br Contact a health care provider:  Before starting a new exercise program.  If you have questions or concerns about your weight.  If you have a medical problem that keeps you from exercising. Get help right away if you have any of the following while exercising:  Injury.  Dizziness.  Difficulty breathing or shortness of breath that does not go away when you stop exercising.  Chest pain.  Rapid heartbeat. Summary  Being overweight increases your risk of heart disease, stroke, diabetes, high blood pressure, and several types of cancer.  Losing weight happens when you burn more calories than you eat.  Reducing the amount of calories you eat in addition to getting regular moderate or vigorous exercise each week helps you lose weight. This information is not intended to replace advice given to you by your health care provider. Make sure you discuss any questions you have with your health care provider. Document Revised: 07/02/2017 Document Reviewed: 07/02/2017 Elsevier Patient Education  2020 [employer].

## 2019-12-09 NOTE — Assessment & Plan Note (Signed)
Secondary to Topamax side effects will do phentermine.  Tapering off Topamax.  Will give a couple days in between and then start phentermine.  Close follow-up.  Educated on signs and symptoms to look for and changes.   Strongly encouraged to work on eating habits, avoid snacking, lifestyle changes, exercise.  Provided with information on diet and exercise.  In addition possible need for referral to nutrition and/or weight loss clinic in the near future.

## 2019-12-09 NOTE — Progress Notes (Signed)
Subjective:  Patient ID: Jacqueline Collins, female    DOB: April 12, 1986  Age: 34 y.o. MRN: [id number]  CC:  Chief Complaint  Patient presents with  . Follow-up    4 week follow up headaches and weight headaches are better weight is the same gets sob and was in er for chest pain and feels like this is due to weight  . Weight Loss    would like to discuss weight loss options       HPI  HPI  Jacqueline Collins is a 34 year old female patient of mine.  She presents today for follow-up on headaches.  She also would like discussed weight loss.  I started her on Topamax and Maxalt a month ago.  She reported that she was doing well with these overall.  But was nervous about staying on the Maxalt secondary to the side effects.  Today she reports that she has numbness and tingling in her left arm predominantly.  She reports that her headaches have improved.  However she is having some possible symptomatic side effects she is willing to have an adjustment of her medications.  Additionally she reports that she would like to do weight loss surgery.  However she has never tried weight loss medications.  Secondary to the changes she is experienced with the use of Topamax this is not an option to increase to see if we can help suppress her appetite.  However she is never tried phentermine her blood pressure is controlled and she is willing to try that.  She is willing to go off the Topamax and get some time in between starting phentermine.  Additionally will hold off on starting any other daily prophylactic medication and she will use the Maxalt as needed.  She denies have any chest pain, headaches, shortness of breath outside of deconditioning, changes in bowel or bladder habits.  Blood in urine or stool.  Vision changes.  Dizziness or sleep changes.  Today patient denies signs and symptoms of COVID 19 infection including fever, chills, cough, shortness of breath, and headache. Past Medical, Surgical, Social  History, Allergies, and Medications have been Reviewed.   Past Medical History:  Diagnosis Date  . Asthma   . Axillary lump 08/12/2013   Left axillary lump. [facility]erred patient to [facility] for left breast ultrasound. Appointment scheduled for Tuesday, August 12, 2013 at 0945.   Marland Kitchen Headache   . Obesity     Current Meds  Medication Sig  . albuterol (VENTOLIN HFA) 108 (90 Base) MCG/ACT inhaler Inhale 1-2 puffs into the lungs every 6 (six) hours as needed for wheezing or shortness of breath.  . ciprofloxacin (CIPRO) 250 MG tablet SMARTSIG:1 Tablet(s) By Mouth Every 12 Hours  . furosemide (LASIX) 20 MG tablet Take 20 mg by mouth daily as needed.  . potassium chloride (KLOR-CON) 10 MEQ tablet Take 10 mEq by mouth daily.  . rizatriptan (MAXALT) 5 MG tablet Take 1 tablet (5 mg total) by mouth as needed for migraine. May repeat in 2 hours if needed  . topiramate (TOPAMAX) 25 MG tablet Take 1 tablet (25 mg total) by mouth daily for 7 days, THEN 2 tablets (50 mg total) daily for 7 days, THEN 3 tablets (75 mg total) daily for 7 days, THEN 4 tablets (100 mg total) daily for 7 days.    ROS:  Review of Systems  Constitutional:       See hpi  HENT: Negative.   Eyes: Negative.  Respiratory: Negative.   Cardiovascular: Negative.   Gastrointestinal: Negative.   Genitourinary: Negative.   Musculoskeletal: Negative.   Skin: Negative.   Neurological: Positive for sensory change.  Endo/Heme/Allergies: Negative.   Psychiatric/Behavioral: Negative.   All other systems reviewed and are negative.    Objective:   Today's Vitals: BP 132/84 (BP Location: Left Arm, Patient Position: Sitting, Cuff Size: Normal)   Pulse 73   Temp 97.9 F (36.6 C) (Temporal)   Ht 5\' 2"  (1.575 m)   Wt (!) 388 lb 6.4 oz (176.2 kg)   SpO2 99%   BMI 71.04 kg/m  Vitals with BMI 12/09/2019 11/25/2019 11/25/2019  Height 5\' 2"  - 5\' 2"   Weight 388 lbs 6 oz - 388 lbs  BMI 02.54 - 27.06  Systolic 237 628  -  Diastolic 84 70 -  Pulse 73 80 -     Physical Exam Vitals and nursing note reviewed.  Constitutional:      Appearance: Normal appearance. She is well-developed and well-groomed. She is morbidly obese.  HENT:     Head: Normocephalic and atraumatic.     Right Ear: External ear normal.     Left Ear: External ear normal.     Nose: Nose normal.     Mouth/Throat:     Mouth: Mucous membranes are moist.     Pharynx: Oropharynx is clear.  Eyes:     General:        Right eye: No discharge.        Left eye: No discharge.     Conjunctiva/sclera: Conjunctivae normal.  Cardiovascular:     Rate and Rhythm: Normal rate and regular rhythm.     Pulses: Normal pulses.     Heart sounds: Normal heart sounds.  Pulmonary:     Effort: Pulmonary effort is normal.     Breath sounds: Normal breath sounds.  Musculoskeletal:        General: Normal range of motion.     Cervical back: Normal range of motion and neck supple.  Skin:    General: Skin is warm.  Neurological:     General: No focal deficit present.     Mental Status: She is alert and oriented to person, place, and time.  Psychiatric:        Attention and Perception: Attention normal.        Mood and Affect: Mood normal.        Speech: Speech normal.        Behavior: Behavior normal. Behavior is cooperative.        Thought Content: Thought content normal.        Cognition and Memory: Cognition normal.        Judgment: Judgment normal.     Assessment   1. Morbid obesity with body mass index of 70 and over in adult [facility])     Tests ordered No orders of the defined types were placed in this encounter.    Plan: Please see assessment and plan per problem list above.   Meds ordered this encounter  Medications  . phentermine (ADIPEX-P) 37.5 MG tablet    Sig: Take 1 tablet (37.5 mg total) by mouth daily before breakfast.    Dispense:  30 tablet    Refill:  0    Order Specific Question:   Supervising Provider    Answer:    Jacklynn Bue    Patient to follow-up in .01/06/2020  Perlie Mayo, NP

## 2019-12-16 ENCOUNTER — Encounter: Payer: Self-pay | Admitting: Family Medicine

## 2019-12-16 ENCOUNTER — Other Ambulatory Visit: Payer: Self-pay

## 2019-12-16 ENCOUNTER — Ambulatory Visit (INDEPENDENT_AMBULATORY_CARE_PROVIDER_SITE_OTHER): Payer: 59 | Admitting: Family Medicine

## 2019-12-16 VITALS — BP 126/76 | HR 86 | Temp 97.8°F | Ht 62.0 in | Wt 387.0 lb

## 2019-12-16 DIAGNOSIS — R0683 Snoring: Secondary | ICD-10-CM

## 2019-12-16 DIAGNOSIS — R002 Palpitations: Secondary | ICD-10-CM | POA: Insufficient documentation

## 2019-12-16 NOTE — Progress Notes (Signed)
Subjective:  Patient ID: Jacqueline Collins, female    DOB: [birth year]  Age: 34 y.o. MRN: [id number]  CC:  Chief Complaint  Patient presents with  . Insomnia  . Palpitations      HPI  HPI [name] presents today in office due to increase trouble sleeping and feelings of her heart racing.  She reports having trouble sleeping. She snores heavy and mother reports periods where it sounds like she stops breathing. Is open to having a sleep study done. Additionally, she feels like her heart is racing. She has been to the ER several times for this problem and they tell her that everything is ok. She did not have changes on labs, chest xray or EKG. She denies having any chest pain at this time in the office.  Denies having any shortness of breath or cough.  Denies having any dizziness or headaches.  Denies having any changes in bowel or bladder habits.  Today patient denies signs and symptoms of COVID 19 infection including fever, chills, cough, shortness of breath, and headache. Past Medical, Surgical, Social History, Allergies, and Medications have been Reviewed.   Past Medical History:  Diagnosis Date  . Asthma   . Axillary lump 08/12/2013   Left axillary lump. Referred patient to the [facility] for left breast ultrasound. Appointment scheduled for Tuesday, August 12, 2013 at [facility].   Marland Kitchen Headache   . Obesity     Current Meds  Medication Sig  . albuterol (VENTOLIN HFA) 108 (90 Base) MCG/ACT inhaler Inhale 1-2 puffs into the lungs every 6 (six) hours as needed for wheezing or shortness of breath.  . furosemide (LASIX) 20 MG tablet Take 20 mg by mouth daily as needed.  . potassium chloride (KLOR-CON) 10 MEQ tablet Take 10 mEq by mouth daily.  . rizatriptan (MAXALT) 5 MG tablet Take 1 tablet (5 mg total) by mouth as needed for migraine. May repeat in 2 hours if needed  . Vitamin D, Ergocalciferol, (DRISDOL) 1.25 MG (50000 UNIT) CAPS capsule Take 50,000 Units by mouth once  a week.    ROS:  Review of Systems  Constitutional: Negative.   HENT: Negative.   Eyes: Negative.   Respiratory: Negative.   Cardiovascular: Positive for palpitations.  Gastrointestinal: Negative.   Genitourinary: Negative.   Musculoskeletal: Negative.   Skin: Negative.   Neurological: Negative.   Endo/Heme/Allergies: Negative.   Psychiatric/Behavioral: The patient has insomnia.   All other systems reviewed and are negative.    Objective:   Today's Vitals: BP 126/76 (BP Location: Right Arm, Patient Position: Sitting, Cuff Size: Normal)   Pulse 86   Temp 97.8 F (36.6 C) (Temporal)   Ht 5\' 2"  (1.575 m)   Wt (!) 387 lb (175.5 kg)   SpO2 96%   BMI 70.78 kg/m  Vitals with BMI 12/16/2019 12/09/2019 11/25/2019  Height 5\' 2"  5\' 2"  -  Weight 387 lbs 388 lbs 6 oz -  BMI 41.32 44.01 -  Systolic 027 253 664  Diastolic 76 84 70  Pulse 86 73 80     Physical Exam Vitals and nursing note reviewed.  Constitutional:      Appearance: Normal appearance. She is well-developed and well-groomed. She is morbidly obese.  HENT:     Head: Normocephalic and atraumatic.     Right Ear: External ear normal.     Left Ear: External ear normal.     Mouth/Throat:     Comments: Mask in place Eyes:  General:        Right eye: No discharge.        Left eye: No discharge.     Conjunctiva/sclera: Conjunctivae normal.  Cardiovascular:     Rate and Rhythm: Normal rate and regular rhythm.     Pulses: Normal pulses.     Heart sounds: Normal heart sounds.  Pulmonary:     Effort: Pulmonary effort is normal.     Breath sounds: Normal breath sounds.  Musculoskeletal:        General: Normal range of motion.     Cervical back: Normal range of motion and neck supple.     Right lower leg: No edema.     Left lower leg: No edema.  Skin:    General: Skin is warm.  Neurological:     General: No focal deficit present.     Mental Status: She is alert and oriented to person, place, and time.    Psychiatric:        Attention and Perception: Attention normal.        Mood and Affect: Mood normal.        Speech: Speech normal.        Behavior: Behavior normal. Behavior is cooperative.        Thought Content: Thought content normal.        Cognition and Memory: Cognition normal.        Judgment: Judgment normal.     Comments: Pleasant in communication, good eye contact.    Assessment   1. Palpitation   2. Snoring     Tests ordered Orders Placed This Encounter  Procedures  . Ambulatory referral to Cardiology     Plan: Please see assessment and plan per problem list above.   No orders of the defined types were placed in this encounter.   Patient to follow-up in 01/06/2020   Freddy Finner, NP

## 2019-12-16 NOTE — Assessment & Plan Note (Signed)
Referral to cardiology for additional follow-up possible Holter monitor.  Unable to capture on PE or on EKG recently in the emergency room.  Have discussed the causes of palpitations including but not limited to lack of sleep, caffeine use, not enough fluid and or water intake, obesity, sleep apnea with heart rate changes.  She is willing to go to the cardiologist, as well as get a sleep study.  Which hopefully can be ordered through the cardiologist office.

## 2019-12-16 NOTE — Assessment & Plan Note (Signed)
Reports snoring, and periods of not snoring/breathing well.  Discussed the causes of possible sleep apnea which are some of the father symptoms she is displaying.  Most consistently with weight issues.  She does have a huge desire to lose weight.  Hopefully sleep study will identify possible issues and she can get treatment started as far as that goes.

## 2019-12-16 NOTE — Patient Instructions (Addendum)
I appreciate the opportunity to provide you with care for your health and wellness. Today we discussed: palpitations  Follow up: 01/06/2020 as scheduled  No labs   Referral to cardiology  Hydrate well with water Eat fruits veggies heavy   Please continue to practice social distancing to keep you, your family, and our community safe.  If you must go out, please wear a mask and practice good handwashing.  It was a pleasure to see you and I look forward to continuing to work together on your health and well-being. Please do not hesitate to call the office if you need care or have questions about your care.  Have a wonderful day and week. With Gratitude, Tereasa Coop, DNP, AGNP-BC

## 2019-12-21 ENCOUNTER — Encounter (HOSPITAL_COMMUNITY): Payer: Self-pay | Admitting: Emergency Medicine

## 2019-12-21 ENCOUNTER — Emergency Department (HOSPITAL_COMMUNITY)
Admission: EM | Admit: 2019-12-21 | Discharge: 2019-12-21 | Disposition: A | Discharge status: Home / Self Care | Payer: 59 | Attending: Emergency Medicine | Admitting: Emergency Medicine

## 2019-12-21 ENCOUNTER — Other Ambulatory Visit: Payer: Self-pay

## 2019-12-21 ENCOUNTER — Emergency Department (HOSPITAL_COMMUNITY): Payer: 59

## 2019-12-21 ENCOUNTER — Encounter: Payer: Self-pay | Admitting: Family Medicine

## 2019-12-21 DIAGNOSIS — F17290 Nicotine dependence, other tobacco product, uncomplicated: Secondary | ICD-10-CM | POA: Insufficient documentation

## 2019-12-21 DIAGNOSIS — J45909 Unspecified asthma, uncomplicated: Secondary | ICD-10-CM | POA: Diagnosis not present

## 2019-12-21 DIAGNOSIS — Z7983 Long term (current) use of bisphosphonates: Secondary | ICD-10-CM | POA: Insufficient documentation

## 2019-12-21 DIAGNOSIS — R0789 Other chest pain: Secondary | ICD-10-CM | POA: Diagnosis not present

## 2019-12-21 DIAGNOSIS — Z79899 Other long term (current) drug therapy: Secondary | ICD-10-CM | POA: Diagnosis not present

## 2019-12-21 DIAGNOSIS — I10 Essential (primary) hypertension: Secondary | ICD-10-CM | POA: Insufficient documentation

## 2019-12-21 DIAGNOSIS — Z7951 Long term (current) use of inhaled steroids: Secondary | ICD-10-CM | POA: Insufficient documentation

## 2019-12-21 DIAGNOSIS — R200 Anesthesia of skin: Secondary | ICD-10-CM | POA: Insufficient documentation

## 2019-12-21 MED ORDER — ALUM & MAG HYDROXIDE-SIMETH 200-200-20 MG/5ML PO SUSP
30.0000 mL | Freq: Once | ORAL | Status: AC
  Administered 2019-12-21: 30 mL via ORAL
  Filled 2019-12-21: qty 30

## 2019-12-21 MED ORDER — KETOROLAC TROMETHAMINE 30 MG/ML IJ SOLN
30.0000 mg | Freq: Once | INTRAMUSCULAR | Status: AC
  Administered 2019-12-21: 30 mg via INTRAMUSCULAR
  Filled 2019-12-21: qty 1

## 2019-12-21 MED ORDER — OMEPRAZOLE 20 MG PO CPDR
20.0000 mg | DELAYED_RELEASE_CAPSULE | Freq: Every day | ORAL | 0 refills | Status: DC
Start: 2019-12-21 — End: 2019-12-30

## 2019-12-21 NOTE — ED Notes (Signed)
Radiology at bedside

## 2019-12-21 NOTE — ED Provider Notes (Signed)
[facility] EMERGENCY DEPARTMENT Provider Note   CSN: [id number] Arrival date & time: 12/21/19  0231     History Chief Complaint  Patient presents with  . Chest Pain    Jacqueline Collins is a 34 y.o. female.  HPI     This is a 34 year old female with history of morbid obesity, asthma who presents with chest pain.  Patient reports burning chest pain that is worse at night.  She states that it goes into her left arm.  She had a similar episode roughly 1 month ago and was seen at that time.  She states that she is not taking anything for her symptoms.  It is not associated necessarily with eating.  She does state that occasionally she can stretch out and have improvement of her pain.  Denies any shortness of breath, cough, fevers.  Denies any injury.  No known sick contacts or Covid exposures.  Currently she rates her pain at 7 out of 10.  Denies any lower extremity swelling or history of blood clots.  Chart reviewed from prior ED visit.  At that time she had a negative ischemic work-up and a negative D-dimer.  Patient was treated for musculoskeletal etiology of pain. Past Medical History:  Diagnosis Date  . Asthma   . Axillary lump 08/12/2013   Left axillary lump. [facility]erred patient to the [facility] for left breast ultrasound. Appointment scheduled for Tuesday, August 12, 2013 at 0945.   Marland Kitchen Headache   . Obesity     Patient Active Problem List   Diagnosis Date Noted  . Palpitation 12/16/2019  . Snoring 12/16/2019  . Depression, major, single episode, moderate (HCC) 10/01/2019  . Stress 10/01/2019  . Migraine 10/01/2019  . Mild intermittent asthma without complication 10/01/2019  . Encounter for screening for malignant neoplasm of cervix 10/01/2019  . Nicotine abuse 10/01/2019  . Morbid obesity with body mass index of 70 and over in adult [facility]) 10/01/2019  . Essential hypertension 10/01/2019    Past Surgical History:  Procedure Laterality Date  . MOUTH SURGERY        OB History    Gravida  0   Para      Term      Preterm      AB      Living        SAB      TAB      Ectopic      Multiple      Live Births              Family History  Problem Relation Age of Onset  . Hypertension Mother   . Diabetes Mother   . Obesity Mother   . Kidney disease Brother   . Other Brother        kidney transplant    Social History   Tobacco Use  . Smoking status: Current Every Day Smoker    Types: E-cigarettes  . Smokeless tobacco: Never Used  . Tobacco comment: occasional vaping  Vaping Use  . Vaping Use: Every day  . Substances: Nicotine, CBD, Flavoring  Substance Use Topics  . Alcohol use: Yes    Comment: occ  . Drug use: No    Home Medications Prior to Admission medications   Medication Sig Start Date End Date Taking? Authorizing Provider  albuterol (VENTOLIN HFA) 108 (90 Base) MCG/ACT inhaler Inhale 1-2 puffs into the lungs every 6 (six) hours as needed for wheezing or shortness of breath.  09/30/19   Freddy Finner, NP  furosemide (LASIX) 20 MG tablet Take 20 mg by mouth daily as needed. 09/22/19   [provider]  omeprazole (PRILOSEC) 20 MG capsule Take 1 capsule (20 mg total) by mouth daily. 12/21/19   Dotsie Gillette, Mayer Masker, MD  phentermine (ADIPEX-P) 37.5 MG tablet Take 1 tablet (37.5 mg total) by mouth daily before breakfast. Patient not taking: Reported on 12/16/2019 12/09/19   Freddy Finner, NP  potassium chloride (KLOR-CON) 10 MEQ tablet Take 10 mEq by mouth daily. 09/22/19   [provider]  rizatriptan (MAXALT) 5 MG tablet Take 1 tablet (5 mg total) by mouth as needed for migraine. May repeat in 2 hours if needed 11/11/19   Freddy Finner, NP  topiramate (TOPAMAX) 25 MG tablet Take 1 tablet (25 mg total) by mouth daily for 7 days, THEN 2 tablets (50 mg total) daily for 7 days, THEN 3 tablets (75 mg total) daily for 7 days, THEN 4 tablets (100 mg total) daily for 7 days. 11/11/19 12/09/19  Freddy Finner, NP   Vitamin D, Ergocalciferol, (DRISDOL) 1.25 MG (50000 UNIT) CAPS capsule Take 50,000 Units by mouth once a week. 09/02/19   [provider]    Allergies    Patient has no known allergies.  Review of Systems   Review of Systems  Constitutional: Negative for fever.  Respiratory: Negative for cough and shortness of breath.   Cardiovascular: Positive for chest pain. Negative for leg swelling.  Gastrointestinal: Negative for abdominal pain, nausea and vomiting.  Genitourinary: Negative for dysuria.  Neurological: Positive for numbness.  All other systems reviewed and are negative.   Physical Exam Updated Vital Signs BP 123/85   Pulse 76   Temp 98 F (36.7 C) (Oral)   Resp 14   SpO2 97%   Physical Exam Vitals and nursing note reviewed.  Constitutional:      Appearance: She is well-developed.     Comments: Morbidly obese, nontoxic-appearing, no acute distress  HENT:     Head: Normocephalic and atraumatic.  Eyes:     Pupils: Pupils are equal, round, and reactive to light.  Cardiovascular:     Rate and Rhythm: Normal rate and regular rhythm.     Heart sounds: Normal heart sounds.     Comments: Tenderness palpation anterior chest wall, no crepitus, no overlying skin changes Pulmonary:     Effort: Pulmonary effort is normal. No respiratory distress.     Breath sounds: No wheezing.  Chest:     Chest wall: Tenderness present. No crepitus.  Abdominal:     General: Bowel sounds are normal.     Palpations: Abdomen is soft.  Musculoskeletal:     Cervical back: Neck supple.     Right lower leg: No tenderness. No edema.     Left lower leg: No tenderness. No edema.  Skin:    General: Skin is warm and dry.  Neurological:     Mental Status: She is alert and oriented to person, place, and time.  Psychiatric:        Mood and Affect: Mood normal.     ED Results / Procedures / Treatments   Labs (all labs ordered are listed, but only abnormal results are displayed) Labs  Reviewed - No data to display  EKG EKG Interpretation  Date/Time:  Sunday December 21 2019 02:50:43 EDT Ventricular Rate:  87 PR Interval:    QRS Duration: 80 QT Interval:  361 QTC Calculation: 435 R  Axis:   10 Text Interpretation: Sinus rhythm Low voltage, precordial leads Confirmed by Thayer Jew [phone]) on 12/21/2019 5:17:11 AM   Radiology DG Chest Portable 1 View  Result Date: 12/21/2019 CLINICAL DATA:  Chest pain.  Arm numbness EXAM: PORTABLE CHEST 1 VIEW COMPARISON:  Radiograph 11/25/2019 FINDINGS: Normal cardiac silhouette. No effusion, infiltrate or pneumothorax. Mild central venous congestion. No acute osseous abnormality. IMPRESSION: Mild central venous congestion. Electronically Signed   By: Suzy Bouchard M.D.   On: 12/21/2019 05:15    Procedures Procedures (including critical care time)  Medications Ordered in ED Medications  alum & mag hydroxide-simeth (MAALOX/MYLANTA) 200-200-20 MG/5ML suspension 30 mL (30 mLs Oral Given 12/21/19 0335)  ketorolac (TORADOL) 30 MG/ML injection 30 mg (30 mg Intramuscular Given 12/21/19 0336)    ED Course  I have reviewed the triage vital signs and the nursing notes.  Pertinent labs & imaging results that were available during my care of the patient were reviewed by me and considered in my medical decision making (see chart for details).    MDM Rules/Calculators/A&P                          Patient presents again with chest pain.  She is overall nontoxic and vital signs are reassuring.  Some features are suggestive of possible reflux or gastritis.  However, reproducible nature of pain would also suggest musculoskeletal etiology.  She is morbidly obese but otherwise low risk for ACS.  EKG without acute ischemic or arrhythmic changes.  Breath sounds are clear but will obtain a chest x-ray to rule out pneumothorax or pneumonia.  Do not feel she needs a ACS work-up at this time.  She had a prior negative D-dimer and is PE RC negative.   Doubt PE.  Chest x-ray independently reviewed by myself and shows no evidence of pneumothorax or pneumonia.  Patient does report some improvement of symptoms with a GI cocktail.  Will start on omeprazole daily.  Have her follow-up with her primary physician.  At this time do not feel she needs further work-up as I doubt an acute emergent process.  After history, exam, and medical workup I feel the patient has been appropriately medically screened and is safe for discharge home. Pertinent diagnoses were discussed with the patient. Patient was given return precautions.   Final Clinical Impression(s) / ED Diagnoses Final diagnoses:  Atypical chest pain    Rx / DC Orders ED Discharge Orders         Ordered    omeprazole (PRILOSEC) 20 MG capsule  Daily     Discontinue  Reprint     12/21/19 0523           Merryl Hacker, MD 12/21/19 [phone]

## 2019-12-21 NOTE — Discharge Instructions (Addendum)
You were seen today for nonspecific chest pain.  Your work-up is reassuring.  This is likely either musculoskeletal in nature or could be related to reflux or GERD.  Start omeprazole daily to see if this helps with your symptoms.  Follow-up with your primary doctor for ongoing symptoms.

## 2019-12-21 NOTE — ED Triage Notes (Signed)
Pt reports chest pain and left arm numbness. Pt states she was seen for the same 1 month ago. Pt reports that the pain is getting worse at night time and while she is moving around at work.

## 2019-12-22 NOTE — Telephone Encounter (Signed)
Called pt advised to go er or urgent care she stated she had already been to ER they told her it was same as before and advised she follow up with neurology. She said she had cancelled her appt and needed to reschedule advised her to call and reschedule this appointment. She said she would wait as she wanted to get somewhere closer to home would call us back with what she decided. FYI

## 2019-12-23 ENCOUNTER — Other Ambulatory Visit: Payer: Self-pay

## 2019-12-23 ENCOUNTER — Encounter: Payer: Self-pay | Admitting: Family Medicine

## 2019-12-23 ENCOUNTER — Ambulatory Visit (INDEPENDENT_AMBULATORY_CARE_PROVIDER_SITE_OTHER): Payer: 59 | Admitting: Family Medicine

## 2019-12-23 VITALS — BP 118/81 | Ht 62.0 in | Wt 387.0 lb

## 2019-12-23 DIAGNOSIS — Z09 Encounter for follow-up examination after completed treatment for conditions other than malignant neoplasm: Secondary | ICD-10-CM

## 2019-12-23 DIAGNOSIS — Z6841 Body Mass Index (BMI) 40.0 and over, adult: Secondary | ICD-10-CM

## 2019-12-23 DIAGNOSIS — Z0001 Encounter for general adult medical examination with abnormal findings: Secondary | ICD-10-CM | POA: Insufficient documentation

## 2019-12-23 HISTORY — DX: Encounter for follow-up examination after completed treatment for conditions other than malignant neoplasm: Z09

## 2019-12-23 NOTE — Patient Instructions (Addendum)
I appreciate the opportunity to provide you with care for your health and wellness. Today we discussed: recent ED visit   Follow up: as scheduled  No labs   Referrals today for weight loss clinic  Heart Dr for snoring and palpitations Appt ______________  Neurology Appt for numbness and headaches ___________  Please continue to practice social distancing to keep you, your family, and our community safe.  If you must go out, please wear a mask and practice good handwashing.  It was a pleasure to see you and I look forward to continuing to work together on your health and well-being. Please do not hesitate to call the office if you need care or have questions about your care.  Have a wonderful day and week. With Gratitude, Tereasa Coop, DNP, AGNP-BC

## 2019-12-23 NOTE — Assessment & Plan Note (Addendum)
Referral to weight loss management clinic. She did not do well with phentermine or Topamax. She is strongly encouraged to work on her eating habits, avoid snacking, lifestyle changes, exercise.

## 2019-12-23 NOTE — Telephone Encounter (Signed)
Noted thank you for follow-up which she has an appointment this afternoon we will address this.

## 2019-12-23 NOTE — Progress Notes (Signed)
Virtual Visit via Video Note   This visit type was conducted due to national recommendations for restrictions regarding the COVID-19 Pandemic (e.g. social distancing) in an effort to limit this patient's exposure and mitigate transmission in our community.  Due to her co-morbid illnesses, this patient is at least at moderate risk for complications without adequate follow up.  This format is felt to be most appropriate for this patient at this time.  All issues noted in this document were discussed and addressed.  A limited physical exam was performed with this format.    Evaluation Performed:  Follow-up visit  Date:  12/23/2019   ID:  Jacqueline Collins, DOB Mar 31, 1986, MRN [id number]  Patient Location: Home Provider Location: Office  Location of Patient: Home Location of Provider: Telehealth Consent was obtain for visit to be over via telehealth. I verified that I am speaking with the correct person using two identifiers.  PCP:  Freddy Finner, NP   Chief Complaint:  Follow up from ER   History of Present Illness:    Jacqueline Collins is a 34 y.o. female with history of morbid obesity, asthma.  Presents today for follow-up from emergency room on June 20.  She presented to the emergency room with chest pain.  She reported that she had a burning chest pain that was worse at night.  She stated that it went into her left arm.  She had a similar episode roughly about a month ago and was seen at that time.  She stated that she is not taking anything for symptoms.  It was not associated necessarily with eating.  She does state that she occasionally can stretch out and have some improvement of her pain.  Denies shortness of breath, cough, fevers, chills.  Denies any injury.  No known sick contacts or Covid exposures.  As she did when she was in the emergency room.  Denies having any lower level extremity or history of blood clots.  Review of emergency room note prior to this visit demonstrated negative  ischemic work-up and negative D-dimer.  Patient was treated for musculoskeletal etiology of pain.  PE in emergency room demonstrated tenderness to palpitation anterior chest wall, no crepitus no overlying skin changes. EKG was 87 bpm.  Sinus rhythm.  Confirmed by ED provider.  She was considered nontoxic and vital signs stable and was released for home.  It was suggested variable reflux versus gastritis.  Versus anxiety.  She had reproducible nature of pain which is also musculoskeletal in etiology.  She is morbidly obese but otherwise low risk for ACS.  She did report some improvement with a GI cocktail.  She was started on omeprazole daily.  And advised to follow-up with [place].  She reports that she is feeling a little bit better.  Not having as much pain.  She denies having any excessive chest pain today, shortness of breath, cough, fever, chills, headache, dizziness, vision changes.  The patient does not have symptoms concerning for COVID-19 infection (fever, chills, cough, or new shortness of breath).   Past Medical, Surgical, Social History, Allergies, and Medications have been Reviewed.  Past Medical History:  Diagnosis Date  . Asthma   . Axillary lump 08/12/2013   Left axillary lump. Referred patient to the [facility] for left breast ultrasound. Appointment scheduled for Tuesday, August 12, 2013 at 0945.   Marland Kitchen Headache   . Obesity    Past Surgical History:  Procedure Laterality Date  . MOUTH  SURGERY       Current Meds  Medication Sig  . albuterol (VENTOLIN HFA) 108 (90 Base) MCG/ACT inhaler Inhale 1-2 puffs into the lungs every 6 (six) hours as needed for wheezing or shortness of breath.  . furosemide (LASIX) 20 MG tablet Take 20 mg by mouth daily as needed.  Marland Kitchen omeprazole (PRILOSEC) 20 MG capsule Take 1 capsule (20 mg total) by mouth daily.  . potassium chloride (KLOR-CON) 10 MEQ tablet Take 10 mEq by mouth daily.  . rizatriptan (MAXALT) 5 MG tablet Take 1 tablet  (5 mg total) by mouth as needed for migraine. May repeat in 2 hours if needed  . Vitamin D, Ergocalciferol, (DRISDOL) 1.25 MG (50000 UNIT) CAPS capsule Take 50,000 Units by mouth once a week.     Allergies:   Patient has no known allergies.   ROS:   Please see the history of present illness.    All other systems reviewed and are negative.   Labs/Other Tests and Data Reviewed:    Recent Labs: 02/21/2019: ALT 16 11/25/2019: BUN 10; Creatinine, Ser 0.81; Hemoglobin 12.8; Platelets 335; Potassium 3.5; Sodium 137   Recent Lipid Panel No results found for: CHOL, TRIG, HDL, CHOLHDL, LDLCALC, LDLDIRECT  Wt Readings from Last 3 Encounters:  12/23/19 (!) 387 lb (175.5 kg)  12/16/19 (!) 387 lb (175.5 kg)  12/09/19 (!) 388 lb 6.4 oz (176.2 kg)     Objective:    Vital Signs:  BP 118/81   Ht 5\' 2"  (1.575 m)   Wt (!) 387 lb (175.5 kg)   BMI 70.78 kg/m    VITAL SIGNS:  reviewed GEN:  no acute distress EYES:  sclerae anicteric, EOMI - Extraocular Movements Intact RESPIRATORY:  normal respiratory effort, symmetric expansion SKIN:  no rash, lesions or ulcers. MUSCULOSKELETAL:  no obvious deformities. NEURO:  alert and oriented x 3, no obvious focal deficit PSYCH:  normal affect  ASSESSMENT & PLAN:    1. Encounter for examination following treatment at hospital   2. Morbid obesity with body mass index of 70 and over in adult [facility])  - Amb Referral to Nutrition and Diabetic E   Time:   Today, I have spent 15 minutes with the patient with telehealth technology discussing the above problems.     Medication Adjustments/Labs and Tests Ordered: Current medicines are reviewed at length with the patient today.  Concerns regarding medicines are outlined above.   Tests Ordered: No orders of the defined types were placed in this encounter.   Medication Changes: No orders of the defined types were placed in this encounter.   Disposition:  Follow up 01/06/2020  Signed, Perlie Mayo,  NP  12/23/2019 4:46 PM     [facility]

## 2019-12-23 NOTE — Assessment & Plan Note (Signed)
Extensive review of emergency room note, testing, lab results.  Reviewed with patient findings and discussion.  Referral made for weight loss clinic.  Additionally I have resent the referral for cardiology as she had accidentally canceled this referral not understanding what it was for.  She also has follow-up with neurology.

## 2019-12-29 ENCOUNTER — Encounter (INDEPENDENT_AMBULATORY_CARE_PROVIDER_SITE_OTHER): Payer: Self-pay

## 2019-12-30 ENCOUNTER — Other Ambulatory Visit: Payer: Self-pay

## 2019-12-30 ENCOUNTER — Encounter: Payer: Self-pay | Admitting: Neurology

## 2019-12-30 ENCOUNTER — Ambulatory Visit: Payer: 59 | Admitting: Neurology

## 2019-12-30 VITALS — BP 127/87 | HR 86 | Ht 62.0 in | Wt 392.0 lb

## 2019-12-30 DIAGNOSIS — G43709 Chronic migraine without aura, not intractable, without status migrainosus: Secondary | ICD-10-CM | POA: Diagnosis not present

## 2019-12-30 DIAGNOSIS — R519 Headache, unspecified: Secondary | ICD-10-CM | POA: Diagnosis not present

## 2019-12-30 DIAGNOSIS — H538 Other visual disturbances: Secondary | ICD-10-CM | POA: Diagnosis not present

## 2019-12-30 DIAGNOSIS — R200 Anesthesia of skin: Secondary | ICD-10-CM

## 2019-12-30 DIAGNOSIS — R51 Headache with orthostatic component, not elsewhere classified: Secondary | ICD-10-CM

## 2019-12-30 MED ORDER — AJOVY 225 MG/1.5ML ~~LOC~~ SOAJ: 225.0000 mg | SUBCUTANEOUS | 11 refills | Status: DC

## 2019-12-30 MED ORDER — NURTEC 75 MG PO TBDP: 75.0000 mg | ORAL_TABLET | Freq: Every day | ORAL | 6 refills | Status: DC | PRN

## 2019-12-30 NOTE — Patient Instructions (Signed)
Start Ajovy monthly for migraines: monthly Sleep test and treat Sleep Apnea Nurtec acutely: Please take one tablet at the onset of your headache  Fremanezumab injection What is this medicine? FREMANEZUMAB (fre ma NEZ ue mab) is used to prevent migraine headaches. This medicine may be used for other purposes; ask your health care provider or pharmacist if you have questions. COMMON BRAND NAME(S): AJOVY What should I tell my health care provider before I take this medicine? They need to know if you have any of these conditions:  an unusual or allergic reaction to fremanezumab, other medicines, foods, dyes, or preservatives  pregnant or trying to get pregnant  breast-feeding How should I use this medicine? This medicine is for injection under the skin. You will be taught how to prepare and give this medicine. Use exactly as directed. Take your medicine at regular intervals. Do not take your medicine more often than directed. It is important that you put your used needles and syringes in a special sharps container. Do not put them in a trash can. If you do not have a sharps container, call your pharmacist or healthcare provider to get one. Talk to your pediatrician regarding the use of this medicine in children. Special care may be needed. Overdosage: If you think you have taken too much of this medicine contact a poison control center or emergency room at once. NOTE: This medicine is only for you. Do not share this medicine with others. What if I miss a dose? If you miss a dose, take it as soon as you can. If it is almost time for your next dose, take only that dose. Do not take double or extra doses. What may interact with this medicine? Interactions are not expected. This list may not describe all possible interactions. Give your health care provider a list of all the medicines, herbs, non-prescription drugs, or dietary supplements you use. Also tell them if you smoke, drink alcohol, or use  illegal drugs. Some items may interact with your medicine. What should I watch for while using this medicine? Tell your doctor or healthcare professional if your symptoms do not start to get better or if they get worse. What side effects may I notice from receiving this medicine? Side effects that you should report to your doctor or health care professional as soon as possible:  allergic reactions like skin rash, itching or hives, swelling of the face, lips, or tongue Side effects that usually do not require medical attention (report these to your doctor or health care professional if they continue or are bothersome):  pain, redness, or irritation at site where injected This list may not describe all possible side effects. Call your doctor for medical advice about side effects. You may report side effects to FDA at 1-800-FDA-1088. Where should I keep my medicine? Keep out of the reach of children. You will be instructed on how to store this medicine. Throw away any unused medicine after the expiration date on the label. NOTE: This sheet is a summary. It may not cover all possible information. If you have questions about this medicine, talk to your doctor, pharmacist, or health care provider.  2020 Elsevier/Gold Standard (2017-03-19 17:22:56) Rimegepant oral dissolving tablet What is this medicine? RIMEGEPANT (ri ME je pant) is used to treat migraine headaches with or without aura. An aura is a strange feeling or visual disturbance that warns you of an attack. It is not used to prevent migraines. This medicine may be used for other  purposes; ask your health care provider or pharmacist if you have questions. COMMON BRAND NAME(S): NURTEC ODT What should I tell my health care provider before I take this medicine? They need to know if you have any of these conditions:  kidney disease  liver disease  an unusual or allergic reaction to rimegepant, other medicines, foods, dyes, or  preservatives  pregnant or trying to get pregnant  breast-feeding How should I use this medicine? Take the medicine by mouth. Follow the directions on the prescription label. Leave the tablet in the sealed blister pack until you are ready to take it. With dry hands, open the blister and gently remove the tablet. If the tablet breaks or crumbles, throw it away and take a new tablet out of the blister pack. Place the tablet in the mouth and allow it to dissolve, and then swallow. Do not cut, crush, or chew this medicine. You do not need water to take this medicine. Talk to your pediatrician about the use of this medicine in children. Special care may be needed. Overdosage: If you think you have taken too much of this medicine contact a poison control center or emergency room at once. NOTE: This medicine is only for you. Do not share this medicine with others. What if I miss a dose? This does not apply. This medicine is not for regular use. What may interact with this medicine? This medicine may interact with the following medications:  certain medicines for fungal infections like fluconazole, itraconazole  rifampin This list may not describe all possible interactions. Give your health care provider a list of all the medicines, herbs, non-prescription drugs, or dietary supplements you use. Also tell them if you smoke, drink alcohol, or use illegal drugs. Some items may interact with your medicine. What should I watch for while using this medicine? Visit your health care professional for regular checks on your progress. Tell your health care professional if your symptoms do not start to get better or if they get worse. What side effects may I notice from receiving this medicine? Side effects that you should report to your doctor or health care professional as soon as possible:  allergic reactions like skin rash, itching or hives; swelling of the face, lips, or tongue Side effects that usually do  not require medical attention (report these to your doctor or health care professional if they continue or are bothersome):  nausea This list may not describe all possible side effects. Call your doctor for medical advice about side effects. You may report side effects to FDA at 1-800-FDA-1088. Where should I keep my medicine? Keep out of the reach of children. Store at room temperature between 15 and 30 degrees C (59 and 86 degrees F). Throw away any unused medicine after the expiration date. NOTE: This sheet is a summary. It may not cover all possible information. If you have questions about this medicine, talk to your doctor, pharmacist, or health care provider.  2020 Elsevier/Gold Standard (2018-09-02 00:21:31)
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
## 2020-01-01 ENCOUNTER — Telehealth: Payer: Self-pay

## 2020-01-01 NOTE — Telephone Encounter (Signed)
Pt called office. She tried to use her copay card for nurtec but pharmacy states they still need a PA. Please call.

## 2020-01-01 NOTE — Telephone Encounter (Signed)
Completed PA on Cover My Meds. I also called the pharmacy. They could not get the savings card to process. They are getting an NDC not covered rejection as well. PA pending. Key: [id number]. Pt updated.

## 2020-01-06 ENCOUNTER — Emergency Department (HOSPITAL_COMMUNITY)
Admit: 2020-01-06 | Discharge: 2020-01-06 | Disposition: A | Discharge status: Home / Self Care | Payer: 59 | Attending: Family Medicine | Admitting: Family Medicine

## 2020-01-06 ENCOUNTER — Emergency Department (HOSPITAL_COMMUNITY)
Admission: EM | Admit: 2020-01-06 | Discharge: 2020-01-06 | Disposition: A | Discharge status: Home / Self Care | Payer: 59 | Attending: Family Medicine | Admitting: Family Medicine

## 2020-01-06 ENCOUNTER — Telehealth: Payer: Self-pay | Admitting: Neurology

## 2020-01-06 ENCOUNTER — Other Ambulatory Visit: Payer: Self-pay

## 2020-01-06 ENCOUNTER — Encounter: Payer: Self-pay | Admitting: Family Medicine

## 2020-01-06 ENCOUNTER — Ambulatory Visit (INDEPENDENT_AMBULATORY_CARE_PROVIDER_SITE_OTHER): Payer: 59 | Admitting: Family Medicine

## 2020-01-06 VITALS — BP 138/82 | HR 78 | Temp 97.6°F | Resp 18 | Ht 62.0 in | Wt 392.8 lb

## 2020-01-06 DIAGNOSIS — Z131 Encounter for screening for diabetes mellitus: Secondary | ICD-10-CM

## 2020-01-06 DIAGNOSIS — R200 Anesthesia of skin: Secondary | ICD-10-CM

## 2020-01-06 DIAGNOSIS — R202 Paresthesia of skin: Secondary | ICD-10-CM

## 2020-01-06 DIAGNOSIS — E559 Vitamin D deficiency, unspecified: Secondary | ICD-10-CM

## 2020-01-06 DIAGNOSIS — Z6841 Body Mass Index (BMI) 40.0 and over, adult: Secondary | ICD-10-CM

## 2020-01-06 DIAGNOSIS — K219 Gastro-esophageal reflux disease without esophagitis: Secondary | ICD-10-CM

## 2020-01-06 DIAGNOSIS — I10 Essential (primary) hypertension: Secondary | ICD-10-CM

## 2020-01-06 DIAGNOSIS — M542 Cervicalgia: Secondary | ICD-10-CM | POA: Insufficient documentation

## 2020-01-06 DIAGNOSIS — J4520 Mild intermittent asthma, uncomplicated: Secondary | ICD-10-CM

## 2020-01-06 DIAGNOSIS — Z5321 Procedure and treatment not carried out due to patient leaving prior to being seen by health care provider: Secondary | ICD-10-CM | POA: Diagnosis not present

## 2020-01-06 LAB — POCT GLYCOSYLATED HEMOGLOBIN (HGB A1C)
HbA1c POC (<> result, manual entry): 5.9 % (ref 4.0–5.6)
HbA1c, POC (controlled diabetic range): 5.9 % (ref 0.0–7.0)
HbA1c, POC (prediabetic range): 5.9 % (ref 5.7–6.4)
Hemoglobin A1C: 5.9 % — AB (ref 4.0–5.6)

## 2020-01-06 MED ORDER — ALBUTEROL SULFATE HFA 108 (90 BASE) MCG/ACT IN AERS: 1.0000 | INHALATION_SPRAY | Freq: Four times a day (QID) | RESPIRATORY_TRACT | 1 refills | Status: DC | PRN

## 2020-01-06 MED ORDER — OMEPRAZOLE 20 MG PO CPDR: 20.0000 mg | DELAYED_RELEASE_CAPSULE | Freq: Every day | ORAL | 3 refills | Status: DC

## 2020-01-06 NOTE — Assessment & Plan Note (Addendum)
Will be getting neck x-ray to make sure that she does not have any compression or changes in her cervical spine as she continues to have left arm numbness and tingling

## 2020-01-06 NOTE — ED Notes (Signed)
Per screener, pt called pcp and reported was only supposed to get xray not check in ED. Pt went to main entrance to wait for radiology.

## 2020-01-06 NOTE — Assessment & Plan Note (Signed)
Will be getting updated labs. 

## 2020-01-06 NOTE — Assessment & Plan Note (Signed)
Provided with Prilosec.  Educated on when to take the medication.  Follow-up with GI if not improving still having constipation as well as heartburn questionable chest discomfort.
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
## 2020-01-06 NOTE — Patient Instructions (Addendum)
I appreciate the opportunity to provide you with care for your health and wellness. Today we discussed: several concerns   Follow up: 3 months   Labs today Orders: Xray of Neck today  Referrals today  HAPPY BELATED BIRTHDAY!!!!  Please continue to practice social distancing to keep you, your family, and our community safe.  If you must go out, please wear a mask and practice good handwashing.  It was a pleasure to see you and I look forward to continuing to work together on your health and well-being. Please do not hesitate to call the office if you need care or have questions about your care.  Have a wonderful day and week. With Gratitude, Tereasa Coop, DNP, AGNP-BC

## 2020-01-06 NOTE — Assessment & Plan Note (Signed)
Continued as currently planned.  Did not do well with phentermine or Topamax.  I have encouraged her to work on her eating habits, avoid snacking, lifestyle changes and exercise.

## 2020-01-06 NOTE — Assessment & Plan Note (Signed)
Jacqueline Collins is encouraged to maintain a well balanced diet that is low in salt. Controlled-borderline would benefit from low dose medication.   Additionally, she is also reminded that exercise is beneficial for heart health and control of  Blood pressure. 30-60 minutes daily is recommended-walking was suggested.

## 2020-01-06 NOTE — Telephone Encounter (Signed)
[employer] Auth: [id number] (exp. 01/05/20 to 02/19/20) order sent to GI. They will reach out to the patient to schedule.

## 2020-01-06 NOTE — Assessment & Plan Note (Signed)
Provided with refill of inhaler.  Strongly encouraged to stop smoking and vaping.

## 2020-01-07 ENCOUNTER — Telehealth: Payer: Self-pay | Admitting: Neurology

## 2020-01-07 ENCOUNTER — Ambulatory Visit: Payer: 59 | Admitting: Cardiology

## 2020-01-07 NOTE — Telephone Encounter (Signed)
PA was already completed and was denied. I called the pharmacy and they were able to get the savings card to process successfully. Nurtec will be filled now. I let the pt know and she verbalized appreciation. Pt will call back in the future if she has any problems filling it. She is aware she can have this filled once per month.

## 2020-01-07 NOTE — Telephone Encounter (Signed)
[employer] denied Nurtec and states it is not a covered benefit and is therefore excluded from coverage. Appeals can be faxed within 180 days to [phone]. Reference # [id number].

## 2020-01-07 NOTE — Telephone Encounter (Signed)
Pt called stating that she was informed that her Rimegepant Sulfate (NURTEC) 75 MG TBDP would need a PA in order for the insurance to cover it. Please advise.

## 2020-01-13 ENCOUNTER — Other Ambulatory Visit: Payer: Self-pay

## 2020-01-13 ENCOUNTER — Encounter (INDEPENDENT_AMBULATORY_CARE_PROVIDER_SITE_OTHER): Payer: Self-pay | Admitting: Family Medicine

## 2020-01-13 ENCOUNTER — Ambulatory Visit (INDEPENDENT_AMBULATORY_CARE_PROVIDER_SITE_OTHER): Payer: 59 | Admitting: Family Medicine

## 2020-01-13 VITALS — BP 106/75 | HR 74 | Temp 98.1°F | Ht 64.0 in | Wt 391.0 lb

## 2020-01-13 DIAGNOSIS — F3289 Other specified depressive episodes: Secondary | ICD-10-CM

## 2020-01-13 DIAGNOSIS — K5909 Other constipation: Secondary | ICD-10-CM

## 2020-01-13 DIAGNOSIS — Z9189 Other specified personal risk factors, not elsewhere classified: Secondary | ICD-10-CM

## 2020-01-13 DIAGNOSIS — E559 Vitamin D deficiency, unspecified: Secondary | ICD-10-CM

## 2020-01-13 DIAGNOSIS — K219 Gastro-esophageal reflux disease without esophagitis: Secondary | ICD-10-CM

## 2020-01-13 DIAGNOSIS — R0602 Shortness of breath: Secondary | ICD-10-CM | POA: Diagnosis not present

## 2020-01-13 DIAGNOSIS — G43809 Other migraine, not intractable, without status migrainosus: Secondary | ICD-10-CM | POA: Diagnosis not present

## 2020-01-13 DIAGNOSIS — R5383 Other fatigue: Secondary | ICD-10-CM

## 2020-01-13 DIAGNOSIS — R7303 Prediabetes: Secondary | ICD-10-CM

## 2020-01-13 DIAGNOSIS — R0683 Snoring: Secondary | ICD-10-CM

## 2020-01-13 DIAGNOSIS — Z0289 Encounter for other administrative examinations: Secondary | ICD-10-CM

## 2020-01-13 DIAGNOSIS — Z6841 Body Mass Index (BMI) 40.0 and over, adult: Secondary | ICD-10-CM

## 2020-01-13 NOTE — Progress Notes (Signed)
Dear Dr. Arvilla Market,   Thank you for referring Jacqueline Collins to our clinic. The following note includes my evaluation and treatment recommendations.  Chief Complaint:   OBESITY Jacqueline Collins (MR# [id number]) is a 34 y.o. female who presents for evaluation and treatment of obesity and related comorbidities. Current BMI is Body mass index is 67.11 kg/m. Jacqueline Collins has been struggling with her weight for many years and has been unsuccessful in either losing weight, maintaining weight loss, or reaching her healthy weight goal.  Jacqueline Collins is currently in the action stage of change and ready to dedicate time achieving and maintaining a healthier weight. Jacqueline Collins is interested in becoming our patient and working on intensive lifestyle modifications including (but not limited to) diet and exercise for weight loss.  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  She lives with her girlfriend, Building services engineer.  She says she gets about 5000 steps per day.  She works 12 hour shifts, standing, 3rd shift. 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  Jacqueline Collins's habits were reviewed today and are as follows: Her family eats meals together, she thinks her family will eat healthier with her, her desired weight loss is 192 pounds, she has been heavy most of her life, she started gaining weight 3 years ago, her heaviest weight ever was 392 pounds, she is a picky eater and doesn't like to eat healthier foods, she craves sweets, salads, chicken, and pasta, she snacks frequently in the evenings, she skips lunch and/or dinner frequently, she is frequently drinking liquids with calories and she struggles with emotional eating.  Depression Screen Jacqueline Collins's Food and Mood (modified PHQ-9) score was 16.  Depression screen [facility] 2/9 01/13/2020  Decreased Interest 3  Down, Depressed, Hopeless 1  PHQ - 2 Score 4  Altered sleeping 3  Tired, decreased energy 3  Change in appetite 2  Feeling bad or failure about yourself  0  Trouble concentrating 1    Moving slowly or fidgety/restless 3  Suicidal thoughts 0  PHQ-9 Score 16  Difficult doing work/chores Extremely dIfficult   Subjective:   1. Other fatigue Jacqueline Collins admits to daytime somnolence and reports waking up still tired. Patent has a history of symptoms of daytime fatigue, morning fatigue, morning headache and snoring. Jacqueline Collins generally gets 6 or 7 hours of sleep per night, and states that she has poor quality sleep. Snoring is present. Apneic episodes are present. Epworth Sleepiness Score is 10.  2. SOB (shortness of breath) on exertion Jacqueline Collins notes increasing shortness of breath with exercising and seems to be worsening over time with weight gain. She notes getting out of breath sooner with activity than she used to. This has gotten worse recently. Jacqueline Collins denies shortness of breath at rest or orthopnea.  3. Prediabetes Jacqueline Collins has a diagnosis of prediabetes based on her elevated HgA1c and was informed this puts her at greater risk of developing diabetes. She continues to work on diet and exercise to decrease her risk of diabetes. She denies nausea or hypoglycemia.  Lab Results  Component Value Date   HGBA1C 5.9 (A) 01/06/2020   HGBA1C 5.9 01/06/2020   HGBA1C 5.9 01/06/2020   HGBA1C 5.9 01/06/2020   4. Other migraine without status migrainosus, not intractable She is taking Ajovy and Nurtec.  Followed by Dr. Lucia Gaskins in Neurology.  5. Vitamin D deficiency She is currently taking no vitamin D supplement. She denies nausea, vomiting or muscle weakness.  6. Gastroesophageal reflux disease, unspecified whether esophagitis present She has been experiencing acid reflux symptoms.  She is taking omeprazole.  7. Snoring Also with morning headaches.  Epworth score is 10.  8. Other constipation Jacqueline Collins notes constipation.  She takes Benefiber and MiraLAX and says constipation is still "bad".  9. Other depression, with emotional eating Jacqueline Collins is struggling with emotional eating and using  food for comfort to the extent that it is negatively impacting her health. She has been working on behavior modification techniques to help reduce her emotional eating and has been unsuccessful. She shows no sign of suicidal or homicidal ideations.  Jacqueline Collins snacks often.  PHQ-9 is 16.  Assessment/Plan:   1. Other fatigue Jacqueline Collins does feel that her weight is causing her energy to be lower than it should be. Fatigue may be related to obesity, depression or many other causes. Labs will be ordered, and in the meanwhile, Jacqueline Collins will focus on self care including making healthy food choices, increasing physical activity and focusing on stress reduction.  Orders - Anemia panel - T3 - T4, free - TSH  2. SOB (shortness of breath) on exertion Jacqueline Collins does feel that she gets out of breath more easily that she used to when she exercises. Jacqueline Collins's shortness of breath appears to be obesity related and exercise induced. She has agreed to work on weight loss and gradually increase exercise to treat her exercise induced shortness of breath. Will continue to monitor closely.  3. Prediabetes Jacqueline Collins will continue to work on weight loss, exercise, and decreasing simple carbohydrates to help decrease the risk of diabetes.  Orders  - CBC with Differential/Platelet - Comprehensive metabolic panel - Lipid Panel With LDL/HDL Ratio  4. Other migraine without status migrainosus, not intractable Followed by Neurology for this problem. Those encounter notes were reviewed.  5. Vitamin D deficiency Will check vitamin D level today.  Orders - VITAMIN D 25 Hydroxy (Vit-D Deficiency, Fractures)  6. Gastroesophageal reflux disease, unspecified whether esophagitis present Intensive lifestyle modifications are the first line treatment for this issue. We discussed several lifestyle modifications today and she will continue to work on diet, exercise and weight loss efforts. Orders and follow up as documented in patient  record.   Counseling . If a person has gastroesophageal reflux disease (GERD), food and stomach acid move back up into the esophagus and cause symptoms or problems such as damage to the esophagus. . Anti-reflux measures include: raising the head of the bed, avoiding tight clothing or belts, avoiding eating late at night, not lying down shortly after mealtime, and achieving weight loss. . Avoid ASA, NSAID's, caffeine, alcohol, and tobacco.  . OTC Pepcid and/or Tums are often very helpful for as needed use.  Marland Kitchen. However, for persisting chronic or daily symptoms, stronger medications like Omeprazole may be needed. . You may need to avoid foods and drinks such as: ? Coffee and tea (with or without caffeine). ? Drinks that contain alcohol. ? Energy drinks and sports drinks. ? Bubbly (carbonated) drinks or sodas. ? Chocolate and cocoa. ? Peppermint and mint flavorings. ? Garlic and onions. ? Horseradish. ? Spicy and acidic foods. These include peppers, chili powder, curry powder, vinegar, hot sauces, and BBQ sauce. ? Citrus fruit juices and citrus fruits, such as oranges, lemons, and limes. ? Tomato-based foods. These include red sauce, chili, salsa, and pizza with red sauce. ? Fried and fatty foods. These include donuts, french fries, potato chips, and high-fat dressings. ? High-fat meats. These include hot dogs, rib eye steak, sausage, ham, and bacon.  7. Snoring Jacqueline Collins believes she has a  sleep study this month.  8. Other constipation Jacqueline Collins was informed that a decrease in bowel movement frequency is normal while losing weight, but stools should not be hard or painful. Orders and follow up as documented in patient record.   Counseling Getting to Good Bowel Health: Your goal is to have one soft bowel movement each day. Drink at least 8 glasses of water each day. Eat plenty of fiber (goal is over 25 grams each day). It is best to get most of your fiber from dietary sources which includes leafy  green vegetables, fresh fruit, and whole grains. You may need to add fiber with the help of OTC fiber supplements. These include Metamucil, Citrucel, and Flaxseed. If you are still having trouble, try adding Miralax or Magnesium Citrate. If all of these changes do not work, Dietitian.  9. Other depression, with emotional eating Behavior modification techniques were discussed today to help Jacqueline Collins deal with her emotional/non-hunger eating behaviors.  Orders and follow up as documented in patient record.   10. At risk for heart disease Jacqueline Collins was given approximately 15 minutes of coronary artery disease prevention counseling today. She is 34 y.o. female and has risk factors for heart disease including obesity. We discussed intensive lifestyle modifications today with an emphasis on specific weight loss instructions and strategies.   Repetitive spaced learning was employed today to elicit superior memory formation and behavioral change.  11. Class 3 severe obesity with serious comorbidity and body mass index (BMI) of 60.0 to 69.9 in adult, unspecified obesity type (HCC) Jacqueline Collins is currently in the action stage of change and her goal is to continue with weight loss efforts. I recommend Jacqueline Collins begin the structured treatment plan as follows:  She has agreed to the Category 3 Plan and keeping a food journal and adhering to recommended goals of 1300-1500 calories and 95+ grams of protein.  Exercise goals: No exercise has been prescribed at this time.   Behavioral modification strategies: increasing lean protein intake, decreasing simple carbohydrates, increasing vegetables, increasing water intake, decreasing liquid calories, decreasing sodium intake and increasing high fiber foods.  She was informed of the importance of frequent follow-up visits to maximize her success with intensive lifestyle modifications for her multiple health conditions. She was informed we would discuss her lab results  at her next visit unless there is a critical issue that needs to be addressed sooner. Jacqueline Collins agreed to keep her next visit at the agreed upon time to discuss these results.  Objective:   Blood pressure 106/75, pulse 74, temperature 98.1 F (36.7 C), temperature source Oral, height 5\' 4"  (1.626 m), weight (!) 391 lb (177.4 kg), last menstrual period 12/28/2019, SpO2 98 %. Body mass index is 67.11 kg/m.  Indirect Calorimeter completed today shows a VO2 of 289 and a REE of 2010.  Her calculated basal metabolic rate is 12/30/2019 thus her basal metabolic rate is worse than expected.  General: Cooperative, alert, well developed, in no acute distress. HEENT: Conjunctivae and lids unremarkable. Cardiovascular: Regular rhythm.  Lungs: Normal work of breathing. Neurologic: No focal deficits.   Lab Results  Component Value Date   CREATININE 0.81 11/25/2019   BUN 10 11/25/2019   NA 137 11/25/2019   K 3.5 11/25/2019   CL 104 11/25/2019   CO2 23 11/25/2019   Lab Results  Component Value Date   ALT 16 02/21/2019   AST 20 02/21/2019   ALKPHOS 66 02/21/2019   BILITOT 1.0 02/21/2019   Lab Results  Component Value Date   HGBA1C 5.9 (A) 01/06/2020   HGBA1C 5.9 01/06/2020   HGBA1C 5.9 01/06/2020   HGBA1C 5.9 01/06/2020   Lab Results  Component Value Date   WBC 9.6 11/25/2019   HGB 12.8 11/25/2019   HCT 39.8 11/25/2019   MCV 97.5 11/25/2019   PLT 335 11/25/2019   Attestation Statements:   This is the patient's first visit at [facility]. The patient's NEW PATIENT PACKET was reviewed at length. Included in the packet: current and past health history, medications, allergies, ROS, gynecologic history (women only), surgical history, family history, social history, weight history, weight loss surgery history (for those that have had weight loss surgery), nutritional evaluation, mood and food questionnaire, PHQ9, Epworth questionnaire, sleep habits questionnaire, patient life and  health improvement goals questionnaire. These will all be scanned into the patient's chart under media.   During the visit, I independently reviewed the patient's EKG, bioimpedance scale results, and indirect calorimeter results. I used this information to tailor a meal plan for the patient that will help her to lose weight and will improve her obesity-related conditions going forward. I performed a medically necessary appropriate examination and/or evaluation. I discussed the assessment and treatment plan with the patient. The patient was provided an opportunity to ask questions and all were answered. The patient agreed with the plan and demonstrated an understanding of the instructions. Labs were ordered at this visit and will be reviewed at the next visit unless more critical results need to be addressed immediately. Clinical information was updated and documented in the EMR.   I, Insurance claims handler, CMA, am acting as transcriptionist for Helane Rima, DO  I have reviewed the above documentation for accuracy and completeness, and I agree with the above. Helane Rima, DO

## 2020-01-14 LAB — COMPREHENSIVE METABOLIC PANEL
ALT: 13 IU/L (ref 0–32)
AST: 12 IU/L (ref 0–40)
Albumin/Globulin Ratio: 1.4 (ref 1.2–2.2)
Albumin: 3.9 g/dL (ref 3.8–4.8)
Alkaline Phosphatase: 111 IU/L (ref 48–121)
BUN/Creatinine Ratio: 16 (ref 9–23)
BUN: 12 mg/dL (ref 6–20)
Bilirubin Total: 0.4 mg/dL (ref 0.0–1.2)
CO2: 25 mmol/L (ref 20–29)
Calcium: 8.9 mg/dL (ref 8.7–10.2)
Chloride: 102 mmol/L (ref 96–106)
Creatinine, Ser: 0.77 mg/dL (ref 0.57–1.00)
GFR calc Af Amer: 117 mL/min/{1.73_m2} (ref >59)
GFR calc non Af Amer: 102 mL/min/{1.73_m2} (ref >59)
Globulin, Total: 2.7 g/dL (ref 1.5–4.5)
Glucose: 106 mg/dL — ABNORMAL HIGH (ref 65–99)
Potassium: 4.1 mmol/L (ref 3.5–5.2)
Sodium: 139 mmol/L (ref 134–144)
Total Protein: 6.6 g/dL (ref 6.0–8.5)

## 2020-01-14 LAB — CBC WITH DIFFERENTIAL/PLATELET
Basophils Absolute: 0 10*3/uL (ref 0.0–0.2)
Basos: 0 %
EOS (ABSOLUTE): 0.1 10*3/uL (ref 0.0–0.4)
Eos: 2 %
Hemoglobin: 12.4 g/dL (ref 11.1–15.9)
Immature Grans (Abs): 0 10*3/uL (ref 0.0–0.1)
Immature Granulocytes: 0 %
Lymphocytes Absolute: 1.7 10*3/uL (ref 0.7–3.1)
Lymphs: 25 %
MCH: 31.3 pg (ref 26.6–33.0)
MCHC: 33.4 g/dL (ref 31.5–35.7)
MCV: 94 fL (ref 79–97)
Monocytes Absolute: 0.3 10*3/uL (ref 0.1–0.9)
Monocytes: 4 %
Neutrophils Absolute: 4.8 10*3/uL (ref 1.4–7.0)
Neutrophils: 69 %
Platelets: 298 10*3/uL (ref 150–450)
RBC: 3.96 x10E6/uL (ref 3.77–5.28)
RDW: 12.5 % (ref 11.7–15.4)
WBC: 6.9 10*3/uL (ref 3.4–10.8)

## 2020-01-14 LAB — ANEMIA PANEL
Ferritin: 82 ng/mL (ref 15–150)
Folate, Hemolysate: 477 ng/mL
Folate, RBC: 1286 ng/mL (ref >498)
Hematocrit: 37.1 % (ref 34.0–46.6)
Iron Saturation: 24 % (ref 15–55)
Iron: 84 ug/dL (ref 27–159)
Retic Ct Pct: 2.4 % (ref 0.6–2.6)
Total Iron Binding Capacity: 353 ug/dL (ref 250–450)
UIBC: 269 ug/dL (ref 131–425)
Vitamin B-12: 299 pg/mL (ref 232–1245)

## 2020-01-14 LAB — T3: T3, Total: 121 ng/dL (ref 71–180)

## 2020-01-14 LAB — LIPID PANEL WITH LDL/HDL RATIO
Cholesterol, Total: 175 mg/dL (ref 100–199)
HDL: 38 mg/dL — ABNORMAL LOW (ref >39)
LDL Chol Calc (NIH): 114 mg/dL — ABNORMAL HIGH (ref 0–99)
LDL/HDL Ratio: 3 ratio (ref 0.0–3.2)
Triglycerides: 129 mg/dL (ref 0–149)
VLDL Cholesterol Cal: 23 mg/dL (ref 5–40)

## 2020-01-14 LAB — TSH: TSH: 3.57 u[IU]/mL (ref 0.450–4.500)

## 2020-01-14 LAB — VITAMIN D 25 HYDROXY (VIT D DEFICIENCY, FRACTURES): Vit D, 25-Hydroxy: 14.2 ng/mL — ABNORMAL LOW (ref 30.0–100.0)

## 2020-01-14 LAB — T4, FREE: Free T4: 1.03 ng/dL (ref 0.82–1.77)

## 2020-01-15 ENCOUNTER — Ambulatory Visit
Admission: RE | Admit: 2020-01-15 | Discharge: 2020-01-15 | Disposition: A | Discharge status: Home / Self Care | Payer: 59 | Source: Ambulatory Visit | Attending: Neurology | Admitting: Neurology

## 2020-01-15 ENCOUNTER — Other Ambulatory Visit: Payer: Self-pay

## 2020-01-15 DIAGNOSIS — R519 Headache, unspecified: Secondary | ICD-10-CM

## 2020-01-15 DIAGNOSIS — R200 Anesthesia of skin: Secondary | ICD-10-CM

## 2020-01-15 DIAGNOSIS — R51 Headache with orthostatic component, not elsewhere classified: Secondary | ICD-10-CM

## 2020-01-15 DIAGNOSIS — H538 Other visual disturbances: Secondary | ICD-10-CM

## 2020-01-15 MED ORDER — GADOBENATE DIMEGLUMINE 529 MG/ML IV SOLN
20.0000 mL | Freq: Once | INTRAVENOUS | Status: AC | PRN
  Administered 2020-01-15: 20 mL via INTRAVENOUS

## 2020-01-16 ENCOUNTER — Other Ambulatory Visit: Payer: Self-pay | Admitting: Family Medicine

## 2020-01-16 ENCOUNTER — Encounter: Payer: Self-pay | Admitting: Cardiology

## 2020-01-16 ENCOUNTER — Ambulatory Visit (INDEPENDENT_AMBULATORY_CARE_PROVIDER_SITE_OTHER): Payer: 59 | Admitting: Cardiology

## 2020-01-16 ENCOUNTER — Encounter: Payer: Self-pay | Admitting: *Deleted

## 2020-01-16 VITALS — BP 112/82 | HR 99 | Ht 64.0 in | Wt 396.0 lb

## 2020-01-16 DIAGNOSIS — R002 Palpitations: Secondary | ICD-10-CM

## 2020-01-16 DIAGNOSIS — H538 Other visual disturbances: Secondary | ICD-10-CM | POA: Diagnosis not present

## 2020-01-16 DIAGNOSIS — R51 Headache with orthostatic component, not elsewhere classified: Secondary | ICD-10-CM | POA: Diagnosis not present
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
## 2020-01-16 NOTE — Patient Instructions (Signed)
Your physician recommends that you schedule a follow-up appointment in: PENDING WITH DR [facility]  Your physician recommends that you continue on your current medications as directed. Please refer to the Current Medication list given to you today.  Your physician has recommended that you wear an event monitor FOR 2 WEEKS. Event monitors are medical devices that record the heart's electrical activity. Doctors most often [place] these monitors to diagnose arrhythmias. Arrhythmias are problems with the speed or rhythm of the heartbeat. The monitor is a small, portable device. You can wear one while you do your normal daily activities. This is usually used to diagnose what is causing palpitations/syncope (passing out).  Thank you for choosing [facility]!!

## 2020-01-18 ENCOUNTER — Encounter (INDEPENDENT_AMBULATORY_CARE_PROVIDER_SITE_OTHER): Payer: Self-pay | Admitting: Family Medicine

## 2020-01-26 ENCOUNTER — Encounter (INDEPENDENT_AMBULATORY_CARE_PROVIDER_SITE_OTHER): Payer: Self-pay

## 2020-01-27 ENCOUNTER — Ambulatory Visit (INDEPENDENT_AMBULATORY_CARE_PROVIDER_SITE_OTHER): Payer: 59 | Admitting: Family Medicine

## 2020-01-27 ENCOUNTER — Ambulatory Visit (INDEPENDENT_AMBULATORY_CARE_PROVIDER_SITE_OTHER): Payer: 59

## 2020-01-27 DIAGNOSIS — R002 Palpitations: Secondary | ICD-10-CM

## 2020-01-28 ENCOUNTER — Telehealth: Payer: Self-pay | Admitting: Cardiology

## 2020-01-28 NOTE — Telephone Encounter (Signed)
Pre-cert Verification for the following procedure    Cardiac event monitor Dr. Wyline Mood

## 2020-02-02 ENCOUNTER — Ambulatory Visit: Payer: 59 | Admitting: Cardiology

## 2020-02-10 ENCOUNTER — Ambulatory Visit (INDEPENDENT_AMBULATORY_CARE_PROVIDER_SITE_OTHER): Payer: 59 | Admitting: Family Medicine

## 2020-02-10 ENCOUNTER — Other Ambulatory Visit: Payer: Self-pay

## 2020-02-10 ENCOUNTER — Encounter (INDEPENDENT_AMBULATORY_CARE_PROVIDER_SITE_OTHER): Payer: Self-pay | Admitting: Family Medicine

## 2020-02-10 VITALS — BP 102/67 | HR 71 | Temp 97.9°F | Ht 62.0 in | Wt 389.0 lb

## 2020-02-10 DIAGNOSIS — R7303 Prediabetes: Secondary | ICD-10-CM

## 2020-02-10 DIAGNOSIS — E538 Deficiency of other specified B group vitamins: Secondary | ICD-10-CM

## 2020-02-10 DIAGNOSIS — G4730 Sleep apnea, unspecified: Secondary | ICD-10-CM | POA: Diagnosis not present

## 2020-02-10 DIAGNOSIS — Z9189 Other specified personal risk factors, not elsewhere classified: Secondary | ICD-10-CM | POA: Diagnosis not present

## 2020-02-10 DIAGNOSIS — E559 Vitamin D deficiency, unspecified: Secondary | ICD-10-CM | POA: Diagnosis not present

## 2020-02-10 DIAGNOSIS — Z6841 Body Mass Index (BMI) 40.0 and over, adult: Secondary | ICD-10-CM

## 2020-02-10 MED ORDER — VITAMIN D (ERGOCALCIFEROL) 1.25 MG (50000 UNIT) PO CAPS: 50000.0000 [IU] | ORAL_CAPSULE | ORAL | 0 refills | Status: DC

## 2020-02-10 NOTE — Progress Notes (Signed)
Chief Complaint:   OBESITY Jacqueline Collins is here to discuss her progress with her obesity treatment plan along with follow-up of her obesity related diagnoses. Jacqueline Collins is on the Category 3 Plan and states she is following her eating plan most of the time. Jacqueline Collins states she is exercising for 0 minutes 0 times per week.  Today's visit was #: 2 Starting weight: 391 lbs Starting date: 01/13/2020 Today's weight: 389 lbs Today's date: 02/10/2020 Total lbs lost to date: 2 lbs Total lbs lost since last in-office visit: 2 lbs  Interim History: Jacqueline Collins says she is drinking more water.  Her goal is 1 gallon per day.  She is still eating Gushers most days.  For meal 2, she has baked chicken and vegetables with a small amount of starch side.  Subjective:   1. Vitamin D deficiency Jacqueline Collins's Vitamin D level was 14.2 on 01/13/2020. She is currently taking prescription vitamin D 50,000 IU each week.   2. Sleep-related breathing disorder Jacqueline Collins has snoring, morning headaches, and apneic episodes.  3. B12 deficiency  Lab Results  Component Value Date   VITAMINB12 299 01/13/2020   4. Prediabetes  Lab Results  Component Value Date   HGBA1C 5.9 (A) 01/06/2020   HGBA1C 5.9 01/06/2020   HGBA1C 5.9 01/06/2020   HGBA1C 5.9 01/06/2020   Assessment/Plan:   1. Vitamin D deficiency Not at goal. Optimal goal > 50 ng/dL. There is also evidence to support a goal of >70 ng/dL in patients with cancer and heart disease. She agrees to continue to take prescription Vitamin D @50 ,000 IU every week and will follow-up for routine testing of Vitamin D, at least 2-3 times per year to avoid over-replacement.  - Vitamin D, Ergocalciferol, (DRISDOL) 1.25 MG (50000 UNIT) CAPS capsule; Take 1 capsule (50,000 Units total) by mouth every 7 (seven) days.  Dispense: 4 capsule; Refill: 0  2. Sleep-related breathing disorder Referral has been placed to Sleep Medicine.  Orders - Ambulatory referral to Sleep Studies  3. B12  deficiency The diagnosis was reviewed with the patient. Counseling provided today, see below. We will continue to monitor. Orders and follow up as documented in patient record. Treatment: May include taking vitamin B12 supplements. Avoid alcohol. Eat lots of healthy foods that contain vitamin B12: Beef, pork, chicken, , and organ meats, such as liver. Seafood: This includes clams, rainbow trout, salmon, tuna, and haddock. Eggs. Cereal and dairy products that are fortified: This means that vitamin B12 has been added to the food.   4. Prediabetes Not at goal. Goal is HgbA1c < 5.7 and insulin level closer to 5. She denies nausea or hypoglycemia.  We discussed metformin, but she declines medication.  Lab Results  Component Value Date   HGBA1C 5.9 (A) 01/06/2020   HGBA1C 5.9 01/06/2020   HGBA1C 5.9 01/06/2020   HGBA1C 5.9 01/06/2020   5. At risk for heart disease Jacqueline Collins was given approximately 15 minutes of coronary artery disease prevention counseling today. She is 34 y.o. female and has risk factors for heart disease including obesity and prediabetes. We discussed intensive lifestyle modifications today with an emphasis on specific weight loss instructions and strategies.   During insulin resistance, several metabolic alterations induce the development of cardiovascular disease. For instance, insulin resistance can induce an imbalance in glucose metabolism that generates chronic hyperglycemia, which in turn triggers oxidative stress and causes an inflammatory response that leads to cell damage. Insulin resistance can also alter systemic lipid metabolism which then leads to  the development of dyslipidemia and the well-known lipid triad: (1) high levels of plasma triglycerides, (2) low levels of high-density lipoprotein, and (3) the appearance of small dense low-density lipoproteins. This triad, along with endothelial dysfunction, which can also be induced by aberrant insulin signaling, contribute  to atherosclerotic plaque formation.   Repetitive spaced learning was employed today to elicit superior memory formation and behavioral change.  6. Class 3 severe obesity with serious comorbidity and body mass index (BMI) of 60.0 to 69.9 in adult, unspecified obesity type (HCC) Jacqueline Collins is currently in the action stage of change. As such, her goal is to continue with weight loss efforts. She has agreed to the Category 3 Plan.   Exercise goals: For substantial health benefits, adults should do at least 150 minutes (2 hours and 30 minutes) a week of moderate-intensity, or 75 minutes (1 hour and 15 minutes) a week of vigorous-intensity aerobic physical activity, or an equivalent combination of moderate- and vigorous-intensity aerobic activity. Aerobic activity should be performed in episodes of at least 10 minutes, and preferably, it should be spread throughout the week.  Behavioral modification strategies: increasing lean protein intake, decreasing simple carbohydrates and increasing high fiber foods.  Jacqueline Collins has agreed to follow-up with our clinic in 3 weeks. She was informed of the importance of frequent follow-up visits to maximize her success with intensive lifestyle modifications for her multiple health conditions.   Objective:   Blood pressure 102/67, pulse 71, temperature 97.9 F (36.6 C), temperature source Oral, height 5\' 2"  (1.575 m), weight (!) 389 lb (176.4 kg), SpO2 98 %. Body mass index is 71.15 kg/m.  General: Cooperative, alert, well developed, in no acute distress. HEENT: Conjunctivae and lids unremarkable. Cardiovascular: Regular rhythm.  Lungs: Normal work of breathing. Neurologic: No focal deficits.   Lab Results  Component Value Date   CREATININE 0.77 01/13/2020   BUN 12 01/13/2020   NA 139 01/13/2020   K 4.1 01/13/2020   CL 102 01/13/2020   CO2 25 01/13/2020   Lab Results  Component Value Date   ALT 13 01/13/2020   AST 12 01/13/2020   ALKPHOS 111 01/13/2020    BILITOT 0.4 01/13/2020   Lab Results  Component Value Date   HGBA1C 5.9 (A) 01/06/2020   HGBA1C 5.9 01/06/2020   HGBA1C 5.9 01/06/2020   HGBA1C 5.9 01/06/2020   Lab Results  Component Value Date   TSH 3.570 01/13/2020   Lab Results  Component Value Date   CHOL 175 01/13/2020   HDL 38 (L) 01/13/2020   LDLCALC 114 (H) 01/13/2020   TRIG 129 01/13/2020   Lab Results  Component Value Date   WBC 6.9 01/13/2020   HGB 12.4 01/13/2020   HCT 37.1 01/13/2020   MCV 94 01/13/2020   PLT 298 01/13/2020   Lab Results  Component Value Date   IRON 84 01/13/2020   TIBC 353 01/13/2020   FERRITIN 82 01/13/2020   Attestation Statements:   Reviewed by clinician on day of visit: allergies, medications, problem list, medical history, surgical history, family history, social history, and previous encounter notes.  I, 01/15/2020, CMA, am acting as transcriptionist for Insurance claims handler, DO  I have reviewed the above documentation for accuracy and completeness, and I agree with the above. Helane Rima, DO

## 2020-02-11 ENCOUNTER — Telehealth: Payer: Self-pay | Admitting: Neurology

## 2020-02-11 ENCOUNTER — Other Ambulatory Visit: Payer: Self-pay | Admitting: Neurology

## 2020-02-11 ENCOUNTER — Other Ambulatory Visit: Payer: Self-pay | Admitting: *Deleted

## 2020-02-11 ENCOUNTER — Telehealth: Payer: Self-pay

## 2020-02-11 MED ORDER — UBRELVY 100 MG PO TABS: 100.0000 mg | ORAL_TABLET | ORAL | 0 refills | Status: DC | PRN

## 2020-02-11 NOTE — Telephone Encounter (Signed)
Pt having an issue with the pharmacy filling the Rx for the Nurtec and wanting to see if you could advise her on another medication.

## 2020-02-11 NOTE — Telephone Encounter (Signed)
I called the pt & LVM (ok per DPR) advising that since Nurtec is no longer free regardless of insurance denial, Dr Lucia Gaskins has ordered [name] to take its place. I provided the instructions, take 1 tablet (100 mg) at the onset of her migraine, may repeat in 2 hours if needed but no more than 2 tablets (200 mg) in one day. I let the pt know a PA is likely going to be needed.  Nurtec canceled. Left office number for call back if she has any questions.

## 2020-02-11 NOTE — Progress Notes (Signed)
I called the pt & LVM (ok per DPR) advising that since Nurtec is no longer free regardless of insurance denial, Dr Ahern has ordered Ubrelvy to take its place. I provided the instructions, take 1 tablet (100 mg) at the onset of her migraine, may repeat in 2 hours if needed but no more than 2 tablets (200 mg) in one day. I let the pt know a PA is likely going to be needed.  Nurtec canceled. Left office number for call back if she has any questions.  

## 2020-02-11 NOTE — Telephone Encounter (Signed)
Pt has called to report that her insurance is still not kicking in the cost for her Nurtec.  Pt is asking for a call from RN to discuss.

## 2020-02-11 NOTE — Telephone Encounter (Signed)
That is fine, I already placed the prescription order thanks!

## 2020-02-12 NOTE — Telephone Encounter (Signed)
Left generic message letting patient know that she needed to call the provider that prescribed the medication to discuss any complications or issues.

## 2020-02-17 ENCOUNTER — Encounter: Payer: Self-pay | Admitting: Family Medicine

## 2020-02-18 ENCOUNTER — Encounter: Payer: Self-pay | Admitting: Family Medicine

## 2020-02-18 ENCOUNTER — Telehealth (INDEPENDENT_AMBULATORY_CARE_PROVIDER_SITE_OTHER): Payer: 59 | Admitting: Family Medicine

## 2020-02-18 ENCOUNTER — Other Ambulatory Visit: Payer: Self-pay

## 2020-02-18 VITALS — BP 130/80 | HR 73 | Ht 62.0 in | Wt 392.0 lb

## 2020-02-18 DIAGNOSIS — R21 Rash and other nonspecific skin eruption: Secondary | ICD-10-CM | POA: Diagnosis not present

## 2020-02-18 MED ORDER — NYSTATIN-TRIAMCINOLONE 100000-0.1 UNIT/GM-% EX OINT: 1.0000 "application " | TOPICAL_OINTMENT | Freq: Two times a day (BID) | CUTANEOUS | 0 refills | Status: DC

## 2020-02-18 NOTE — Assessment & Plan Note (Signed)
Rash with minimal impact of hydrocortisone over-the-counter.  Will try a fungal combination with a steroid medication to see if that is helpful in relieving it.  Advised that she might need to have oral prednisone provided for a referral to dermatology if it does not go away.  Patient verbalized understanding.

## 2020-02-18 NOTE — Patient Instructions (Signed)
I appreciate the opportunity to provide you with care for your health and wellness. Today we discussed: Rash  Follow up: As scheduled  No labs or referrals today  Please do not use hydrocortisone cream in conjunction with the cream that I just sent in mycolog. Use the cream I sent in twice daily, for 5 to 7 days.  If it is not better or gets worse please let me know.  Additionally if you would like to refrain from taking oral prednisone we can do a referral to dermatology if this cream does not help.  Please continue to practice social distancing to keep you, your family, and our community safe.  If you must go out, please wear a mask and practice good handwashing.  It was a pleasure to see you and I look forward to continuing to work together on your health and well-being. Please do not hesitate to call the office if you need care or have questions about your care.  Have a wonderful day and week. With Gratitude, Tereasa Coop, DNP, AGNP-BC
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
## 2020-03-10 ENCOUNTER — Ambulatory Visit (INDEPENDENT_AMBULATORY_CARE_PROVIDER_SITE_OTHER): Payer: 59 | Admitting: Family Medicine

## 2020-03-17 ENCOUNTER — Institutional Professional Consult (permissible substitution): Payer: 59 | Admitting: Neurology

## 2020-03-22 ENCOUNTER — Other Ambulatory Visit: Payer: Self-pay

## 2020-03-22 ENCOUNTER — Encounter: Payer: Self-pay | Admitting: Family Medicine

## 2020-03-22 ENCOUNTER — Other Ambulatory Visit: Payer: 59

## 2020-03-22 DIAGNOSIS — Z20822 Contact with and (suspected) exposure to covid-19: Secondary | ICD-10-CM

## 2020-03-22 NOTE — Telephone Encounter (Signed)
Pt is scheduled with Jacqueline Collins 03-26-20 stated back pain has been going on for awhile and she could wait until Friday.  Advised if worsening before Friday to be evaluated by ER or urgent care with verbal understanding

## 2020-03-24 LAB — SARS-COV-2, NAA 2 DAY TAT

## 2020-03-24 LAB — SPECIMEN STATUS REPORT

## 2020-03-24 LAB — NOVEL CORONAVIRUS, NAA: SARS-CoV-2, NAA: NOT DETECTED

## 2020-03-26 ENCOUNTER — Telehealth (INDEPENDENT_AMBULATORY_CARE_PROVIDER_SITE_OTHER): Payer: 59 | Admitting: Family Medicine

## 2020-03-26 ENCOUNTER — Telehealth: Payer: Self-pay | Admitting: *Deleted

## 2020-03-26 ENCOUNTER — Other Ambulatory Visit: Payer: Self-pay

## 2020-03-26 ENCOUNTER — Encounter: Payer: Self-pay | Admitting: Family Medicine

## 2020-03-26 VITALS — BP 135/105 | HR 123 | Ht 62.0 in | Wt 392.0 lb

## 2020-03-26 DIAGNOSIS — M545 Low back pain, unspecified: Secondary | ICD-10-CM

## 2020-03-26 DIAGNOSIS — I10 Essential (primary) hypertension: Secondary | ICD-10-CM | POA: Diagnosis not present

## 2020-03-26 DIAGNOSIS — F439 Reaction to severe stress, unspecified: Secondary | ICD-10-CM | POA: Diagnosis not present

## 2020-03-26 MED ORDER — LISINOPRIL-HYDROCHLOROTHIAZIDE 10-12.5 MG PO TABS: 1.0000 | ORAL_TABLET | Freq: Every day | ORAL | 2 refills | Status: DC

## 2020-03-26 NOTE — Assessment & Plan Note (Signed)
Unable to truly identify cause secondary to being over the phone.  Will do a referral to physical therapy as the way she describes almost sounds muscular in nature.  If they feel an assessment that it could be related to something else we will do a referral as appropriate.

## 2020-03-26 NOTE — Telephone Encounter (Signed)
Pt aware - routed to pcp  

## 2020-03-26 NOTE — Assessment & Plan Note (Signed)
I have been a previous referral to behavioral health but she did not follow through with it. We will do another 1 to see if we can get her into therapy on a regular basis.  Does not want to start any medications at this time for this.  Provided with education on self-care and identification of when to take deep breaths and to remove herself from the situation.  Denies having any SI or HI at this time.  Does have close follow-up in 2 weeks.

## 2020-03-26 NOTE — Assessment & Plan Note (Addendum)
Jacqueline Collins is encouraged to maintain a well balanced diet that is low in salt. NOT Controlled, starting her on combination hydrochlorothiazide and lisinopril. Follow-up in 2 weeks in office for BP check.. Additionally, she is also reminded that exercise is beneficial for heart health and control of  Blood pressure. 30-60 minutes daily is recommended-walking was suggested.

## 2020-03-26 NOTE — Telephone Encounter (Signed)
-----   Message from Antoine Poche, MD sent at 03/24/2020  9:53 AM EDT ----- Normal heart monitor, no significant abnormal heart rhythm. Can f/u with [place] as needed   J BrancH MD

## 2020-03-26 NOTE — Patient Instructions (Signed)
I appreciate the opportunity to provide you with care for your health and wellness. Today we discussed: Back pain and blood pressure and stress  Follow up: As scheduled for blood pressure check  No labs   Referrals today: PT, Therapy  Starting new medication today for BP. Please stop phentermine.  You can use heating pads, muscle creams and tylenol for back pain  (avoid ibuprofen, naproxen or aleve due to high BP)  Please continue to practice social distancing to keep you, your family, and our community safe.  If you must go out, please wear a mask and practice good handwashing.  It was a pleasure to see you and I look forward to continuing to work together on your health and well-being. Please do not hesitate to call the office if you need care or have questions about your care.  Have a wonderful day and week. With Gratitude, Tereasa Coop, DNP, AGNP-BC
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
## 2020-03-31 ENCOUNTER — Institutional Professional Consult (permissible substitution): Payer: 59 | Admitting: Neurology

## 2020-04-07 ENCOUNTER — Telehealth (INDEPENDENT_AMBULATORY_CARE_PROVIDER_SITE_OTHER): Payer: 59 | Admitting: Family Medicine

## 2020-04-07 ENCOUNTER — Ambulatory Visit (HOSPITAL_COMMUNITY): Payer: 59 | Attending: Family Medicine | Admitting: Physical Therapy

## 2020-04-07 ENCOUNTER — Encounter (HOSPITAL_COMMUNITY): Payer: Self-pay | Admitting: Physical Therapy

## 2020-04-07 ENCOUNTER — Other Ambulatory Visit: Payer: Self-pay

## 2020-04-07 ENCOUNTER — Encounter: Payer: Self-pay | Admitting: Family Medicine

## 2020-04-07 VITALS — BP 143/85 | HR 75 | Ht 62.0 in | Wt 390.0 lb

## 2020-04-07 DIAGNOSIS — M545 Low back pain, unspecified: Secondary | ICD-10-CM

## 2020-04-07 DIAGNOSIS — R21 Rash and other nonspecific skin eruption: Secondary | ICD-10-CM

## 2020-04-07 DIAGNOSIS — M6281 Muscle weakness (generalized): Secondary | ICD-10-CM | POA: Diagnosis present

## 2020-04-07 DIAGNOSIS — R29898 Other symptoms and signs involving the musculoskeletal system: Secondary | ICD-10-CM | POA: Insufficient documentation

## 2020-04-07 DIAGNOSIS — G8929 Other chronic pain: Secondary | ICD-10-CM | POA: Diagnosis present

## 2020-04-07 DIAGNOSIS — F439 Reaction to severe stress, unspecified: Secondary | ICD-10-CM | POA: Diagnosis not present

## 2020-04-07 DIAGNOSIS — I10 Essential (primary) hypertension: Secondary | ICD-10-CM | POA: Diagnosis not present

## 2020-04-07 MED ORDER — LISINOPRIL-HYDROCHLOROTHIAZIDE 10-12.5 MG PO TABS: 1.0000 | ORAL_TABLET | Freq: Every day | ORAL | 1 refills | Status: DC

## 2020-04-07 NOTE — Assessment & Plan Note (Signed)
Improved will have her back in 6 weeks in office to check She is tolerating the hydrochlorothiazide combo with lisinopril.  Reports that her swelling overall feeling is much improved.  Continue DASH diet and encouragement of exercise.
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
## 2020-04-07 NOTE — Assessment & Plan Note (Signed)
Previous referral did not go through, new referral made today.  She continues to decline wanting any medication at this time she is not much a medication taker but she was willing to talk to somebody. Denies having any SI or HI at this time.  Advised for her to reach out if she has any issues or concerns prior to her next appointment.  She reports that she has very good family support as well.

## 2020-04-07 NOTE — Patient Instructions (Signed)
Knee to Chest    Lying supine, bend knee to chest hold for 30 seconds and repeat _3__ times. Repeat with other leg. Do _1-2__ times per day. You can use a towel behind the knee  Copyright  VHI. All rights reserved.  Knee-to-Chest Stretch: Bilateral    With hands behind knees, pull both knees in to chest until a comfortable stretch is felt in lower back and buttocks. Keep back relaxed. Hold __30__ seconds. Repeat __3__ times per set. Do __1-2__ sessions per day.  http://orth.exer.us/129   Copyright  VHI. All rights reserved.
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
## 2020-04-07 NOTE — Patient Instructions (Signed)
  HAPPY FALL!  I appreciate the opportunity to provide you with care for your health and wellness. Today we discussed: BP, rash, stress  Follow up: 6-8 weeks in office for BP and wt check   No labs  Referrals today: Dermatologist   We will look in to referral to therapy to help with stress and anxiety   Please continue all medications  Please call if you need anything before next appt.  Please continue to practice social distancing to keep you, your family, and our community safe.  If you must go out, please wear a mask and practice good handwashing.  It was a pleasure to see you and I look forward to continuing to work together on your health and well-being. Please do not hesitate to call the office if you need care or have questions about your care.  Have a wonderful day and week. With Gratitude, Tereasa Coop, DNP, AGNP-BC

## 2020-04-07 NOTE — Assessment & Plan Note (Signed)
Ongoing rash, referral to dermatology provided.

## 2020-04-14 ENCOUNTER — Telehealth (HOSPITAL_COMMUNITY): Payer: Self-pay | Admitting: Physical Therapy

## 2020-04-14 ENCOUNTER — Encounter (HOSPITAL_COMMUNITY): Payer: 59 | Admitting: Physical Therapy

## 2020-04-14 NOTE — Telephone Encounter (Signed)
pt called to cancel today's appt due to she has a migraine.

## 2020-04-15 ENCOUNTER — Ambulatory Visit (HOSPITAL_COMMUNITY): Payer: 59 | Admitting: Physical Therapy

## 2020-04-15 ENCOUNTER — Ambulatory Visit: Payer: 59 | Admitting: Neurology

## 2020-04-15 ENCOUNTER — Other Ambulatory Visit: Payer: Self-pay

## 2020-04-15 ENCOUNTER — Encounter: Payer: Self-pay | Admitting: Neurology

## 2020-04-15 VITALS — BP 132/84 | HR 85 | Ht 62.0 in | Wt 386.0 lb

## 2020-04-15 DIAGNOSIS — J4520 Mild intermittent asthma, uncomplicated: Secondary | ICD-10-CM

## 2020-04-15 DIAGNOSIS — R29898 Other symptoms and signs involving the musculoskeletal system: Secondary | ICD-10-CM

## 2020-04-15 DIAGNOSIS — G4730 Sleep apnea, unspecified: Secondary | ICD-10-CM

## 2020-04-15 DIAGNOSIS — E662 Morbid (severe) obesity with alveolar hypoventilation: Secondary | ICD-10-CM

## 2020-04-15 DIAGNOSIS — G4710 Hypersomnia, unspecified: Secondary | ICD-10-CM

## 2020-04-15 DIAGNOSIS — G8929 Other chronic pain: Secondary | ICD-10-CM

## 2020-04-15 DIAGNOSIS — G4726 Circadian rhythm sleep disorder, shift work type: Secondary | ICD-10-CM

## 2020-04-15 DIAGNOSIS — M545 Low back pain, unspecified: Secondary | ICD-10-CM | POA: Diagnosis not present

## 2020-04-15 DIAGNOSIS — M6281 Muscle weakness (generalized): Secondary | ICD-10-CM

## 2020-04-15 DIAGNOSIS — R519 Headache, unspecified: Secondary | ICD-10-CM

## 2020-04-15 DIAGNOSIS — G4709 Other insomnia: Secondary | ICD-10-CM

## 2020-04-15 DIAGNOSIS — Z6841 Body Mass Index (BMI) 40.0 and over, adult: Secondary | ICD-10-CM

## 2020-04-15 DIAGNOSIS — Z720 Tobacco use: Secondary | ICD-10-CM
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
## 2020-04-15 NOTE — Patient Instructions (Signed)
Insomnia Insomnia is a sleep disorder that makes it difficult to fall asleep or stay asleep. Insomnia can cause fatigue, low energy, difficulty concentrating, mood swings, and poor performance at work or school. There are three different ways to classify insomnia:  Difficulty falling asleep.  Difficulty staying asleep.  Waking up too early in the morning. Any type of insomnia can be long-term (chronic) or short-term (acute). Both are common. Short-term insomnia usually lasts for three months or less. Chronic insomnia occurs at least three times a week for longer than three months. What are the causes? Insomnia may be caused by another condition, situation, or substance, such as:  Anxiety.  Certain medicines.  Gastroesophageal reflux disease (GERD) or other gastrointestinal conditions.  Asthma or other breathing conditions.  Restless legs syndrome, sleep apnea, or other sleep disorders.  Chronic pain.  Menopause.  Stroke.  Abuse of alcohol, tobacco, or illegal drugs.  Mental health conditions, such as depression.  Caffeine.  Neurological disorders, such as Alzheimer's disease.  An overactive thyroid (hyperthyroidism). Sometimes, the cause of insomnia may not be known. What increases the risk? Risk factors for insomnia include:  Gender. Women are affected more often than men.  Age. Insomnia is more common as you get older.  Stress.  Lack of exercise.  Irregular work schedule or working night shifts.  Traveling between different time zones.  Certain medical and mental health conditions. What are the signs or symptoms? If you have insomnia, the main symptom is having trouble falling asleep or having trouble staying asleep. This may lead to other symptoms, such as:  Feeling fatigued or having low energy.  Feeling nervous about going to sleep.  Not feeling rested in the morning.  Having trouble concentrating.  Feeling irritable, anxious, or depressed. How  is this diagnosed? This condition may be diagnosed based on:  Your symptoms and medical history. Your health care provider may ask about: ? Your sleep habits. ? Any medical conditions you have. ? Your mental health.  A physical exam. How is this treated? Treatment for insomnia depends on the cause. Treatment may focus on treating an underlying condition that is causing insomnia. Treatment may also include:  Medicines to help you sleep.  Counseling or therapy.  Lifestyle adjustments to help you sleep better. Follow these instructions at home: Eating and drinking   Limit or avoid alcohol, caffeinated beverages, and cigarettes, especially close to bedtime. These can disrupt your sleep.  Do not eat a large meal or eat spicy foods right before bedtime. This can lead to digestive discomfort that can make it hard for you to sleep. Sleep habits   Keep a sleep diary to help you and your health care provider figure out what could be causing your insomnia. Write down: ? When you sleep. ? When you wake up during the night. ? How well you sleep. ? How rested you feel the next day. ? Any side effects of medicines you are taking. ? What you eat and drink.  Make your bedroom a dark, comfortable place where it is easy to fall asleep. ? Put up shades or blackout curtains to block light from outside. ? Use a white noise machine to block noise. ? Keep the temperature cool.  Limit screen use before bedtime. This includes: ? Watching TV. ? Using your smartphone, tablet, or computer.  Stick to a routine that includes going to bed and waking up at the same times every day and night. This can help you fall asleep faster. Consider   making a quiet activity, such as reading, part of your nighttime routine.  Try to avoid taking naps during the day so that you sleep better at night.  Get out of bed if you are still awake after 15 minutes of trying to sleep. Keep the lights down, but try reading or  doing a quiet activity. When you feel sleepy, go back to bed. General instructions  Take over-the-counter and prescription medicines only as told by your health care provider.  Exercise regularly, as told by your health care provider. Avoid exercise starting several hours before bedtime.  Use relaxation techniques to manage stress. Ask your health care provider to suggest some techniques that may work well for you. These may include: ? Breathing exercises. ? Routines to release muscle tension. ? Visualizing peaceful scenes.  Make sure that you drive carefully. Avoid driving if you feel very sleepy.  Keep all follow-up visits as told by your health care provider. This is important. Contact a health care provider if:  You are tired throughout the day.  You have trouble in your daily routine due to sleepiness.  You continue to have sleep problems, or your sleep problems get worse. Get help right away if:  You have serious thoughts about hurting yourself or someone else. If you ever feel like you may hurt yourself or others, or have thoughts about taking your own life, get help right away. You can go to your nearest emergency department or call:  Your local emergency services (911 in the [place]  A suicide crisis helpline, such as the National Suicide Prevention Lifeline at (830) 400-1251. This is open 24 hours a day. Summary  Insomnia is a sleep disorder that makes it difficult to fall asleep or stay asleep.  Insomnia can be long-term (chronic) or short-term (acute).  Treatment for insomnia depends on the cause. Treatment may focus on treating an underlying condition that is causing insomnia.  Keep a sleep diary to help you and your health care provider figure out what could be causing your insomnia. This information is not intended to replace advice given to you by your health care provider. Make sure you discuss any questions you have with your health care provider. Document  Revised: 06/01/2017 Document Reviewed: 03/29/2017 Elsevier Patient Education  2020 Elsevier Inc[employer] Sleep Apnea Sleep apnea affects breathing during sleep. It causes breathing to stop for a short time or to become shallow. It can also increase the risk of:  Heart attack.  Stroke.  Being very overweight (obese).  Diabetes.  Heart failure.  Irregular heartbeat. The goal of treatment is to help you breathe normally again. What are the causes? There are three kinds of sleep apnea:  Obstructive sleep apnea. This is caused by a blocked or collapsed airway.  Central sleep apnea. This happens when the brain does not send the right signals to the muscles that control breathing.  Mixed sleep apnea. This is a combination of obstructive and central sleep apnea. The most common cause of this condition is a collapsed or blocked airway. This can happen if:  Your throat muscles are too relaxed.  Your tongue and tonsils are too large.  You are overweight.  Your airway is too small. What increases the risk?  Being overweight.  Smoking.  Having a small airway.  Being older.  Being female.  Drinking alcohol.  Taking medicines to calm yourself (sedatives or tranquilizers).  Having family members with the condition. What are the signs or symptoms?  Trouble staying asleep.  Being sleepy or tired during the day.  Getting angry a lot.  Loud snoring.  Headaches in the morning.  Not being able to focus your mind (concentrate).  Forgetting things.  Less interest in sex.  Mood swings.  Personality changes.  Feelings of sadness (depression).  Waking up a lot during the night to pee (urinate).  Dry mouth.  Sore throat. How is this diagnosed?  Your medical history.  A physical exam.  A test that is done when you are sleeping (sleep study). The test is most often done in a sleep lab but may also be done at home. How is this treated?   Sleeping on your  side.  Using a medicine to get rid of mucus in your nose (decongestant).  Avoiding the use of alcohol, medicines to help you relax, or certain pain medicines (narcotics).  Losing weight, if needed.  Changing your diet.  Not smoking.  Using a machine to open your airway while you sleep, such as: ? An oral appliance. This is a mouthpiece that shifts your lower jaw forward. ? A CPAP device. This device blows air through a mask when you breathe out (exhale). ? An EPAP device. This has valves that you put in each nostril. ? A BPAP device. This device blows air through a mask when you breathe in (inhale) and breathe out.  Having surgery if other treatments do not work. It is important to get treatment for sleep apnea. Without treatment, it can lead to:  High blood pressure.  Coronary artery disease.  In men, not being able to have an erection (impotence).  Reduced thinking ability. Follow these instructions at home: Lifestyle  Make changes that your doctor recommends.  Eat a healthy diet.  Lose weight if needed.  Avoid alcohol, medicines to help you relax, and some pain medicines.  Do not use any products that contain nicotine or tobacco, such as cigarettes, e-cigarettes, and chewing tobacco. If you need help quitting, ask your doctor. General instructions  Take over-the-counter and prescription medicines only as told by your doctor.  If you were given a machine to use while you sleep, use it only as told by your doctor.  If you are having surgery, make sure to tell your doctor you have sleep apnea. You may need to bring your device with you.  Keep all follow-up visits as told by your doctor. This is important. Contact a doctor if:  The machine that you were given to use during sleep bothers you or does not seem to be working.  You do not get better.  You get worse. Get help right away if:  Your chest hurts.  You have trouble breathing in enough air.  You have  an uncomfortable feeling in your back, arms, or stomach.  You have trouble talking.  One side of your body feels weak.  A part of your face is hanging down. These symptoms may be an emergency. Do not wait to see if the symptoms will go away. Get medical help right away. Call your local emergency services (911 in the [place] Do not drive yourself to the hospital. Summary  This condition affects breathing during sleep.  The most common cause is a collapsed or blocked airway.  The goal of treatment is to help you breathe normally while you sleep. This information is not intended to replace advice given to you by your health care provider. Make sure you discuss any questions you have with your health care provider. Document  Revised: 04/05/2018 Document Reviewed: 02/12/2018 Elsevier Patient Education  2020 Elsevier Inc. Obesity Hypoventilation Syndrome  Obesity hypoventilation syndrome (OHS) means that you are not breathing well enough to get air in and out of your lungs efficiently (ventilation). This causes a low oxygen level and a high carbon dioxide level in your blood (hypoventilation). Having too much total body fat (obesity) is a significant risk factor for developing OHS. OHS makes it harder for your heart to pump oxygen-rich blood to your body. It can cause sleep disturbances and make you feel sleepy during the day. Over time, OHS can increase your risk for:  Heart disease.  High blood pressure (hypertension).  Reduced ability to absorb sugar from the bloodstream (insulin resistance).  Heart failure. Over time, OHS weakens your heart and can lead to heart failure. What are the causes? The exact cause of OHS is not known. Possible causes include:  Pressure on the lungs from excess body weight.  Obesity-related changes in how much air the lungs can hold (lung capacity) and how much they can expand (lung compliance).  Failure of the brain to regulate oxygen and carbon dioxide  levels properly.  Chemicals (hormones) produced by excess fat cells interfering with breathing regulation.  A breathing condition in which breathing pauses or becomes shallow during sleep (sleep apnea). This condition can eventually cause the body to ventilate poorly and to hold onto carbon dioxide during the day. What increases the risk? You may have a greater risk for OHS if you:  Have a BMI of 30 or higher. BMI is an estimate of body fat that is calculated from height and weight. For adults, a BMI of 30 or higher is considered obese.  Are 48?33 years old.  Carry most of your excess weight around your waist.  Experience moderate symptoms of sleep apnea. What are the signs or symptoms? The most common symptoms of OHS are:  Daytime sleepiness.  Lack of energy.  Shortness of breath.  Morning headaches.  Sleep apnea.  Trouble concentrating.  Irritability, mood swings, or depression.  Swollen veins in the neck.  Swelling of the legs. How is this diagnosed? Your health care provider may suspect OHS if you are obese and have poor breathing during the day and at night. Your health care provider will also do a physical exam. You may have tests to:  Measure your BMI.  Measure your blood oxygen level with a sensor placed on your finger (pulse oximetry).  Measure blood oxygen and carbon dioxide in a blood sample.  Measure the amount of red blood cells in a blood sample. OHS causes the number of red blood cells you have to increase (polycythemia).  Check your breathing ability (pulmonary function testing).  Check your breathing ability, breathing patterns, and oxygen level while you sleep (sleep study). You may also have a chest X-ray to rule out other breathing problems. You may have an electrocardiogram (ECG) and or echocardiogram to check for signs of heart failure. How is this treated? Weight loss is the most important part of treatment for OHS, and it may be the only  treatment that you need. Other treatments may include:  Using a device to open your airway while you sleep, such as a continuous positive airway pressure (CPAP) machine that delivers oxygen to your airway through a mask.  Surgery (gastric bypass surgery) to lower your BMI. This may be needed if: ? You are very obese. ? Other treatments have not worked for you. ? Your OHS is  very severe and is causing organ damage, such as heart failure. Follow these instructions at home:  Medicines  Take over-the-counter and prescription medicines only as told by your health care provider.  Ask your health care provider what medicines are safe for you. You may be told to avoid medicines that can impair breathing and make OHS worse, such as sedatives and narcotics. Sleeping habits  If you are prescribed a CPAP machine, make sure you understand and use the machine as directed.  Try to get 8 hours of sleep every night.  Go to bed at the same time every night, and get up at the same time every day. General instructions  Work with your health care provider to make a diet and exercise plan that helps you reach and maintain a healthy weight.  Eat a healthy diet.  Avoid smoking.  Exercise regularly as told by your health care provider.  During the evening, do not drink caffeine and do not eat heavy meals.  Keep all follow-up visits as told by your health care provider. This is important. Contact a health care provider if:  You experience new or worsening shortness of breath.  You have chest pain.  You have an irregular heartbeat (palpitations).  You have dizziness.  You faint.  You develop a cough.  You have a fever.  You have chest pain when you breathe (pleurisy). This information is not intended to replace advice given to you by your health care provider. Make sure you discuss any questions you have with your health care provider. Document Revised: 10/11/2018 Document Reviewed:  11/29/2015 Elsevier Patient Education  2020 [employer].
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
## 2020-04-21 ENCOUNTER — Telehealth (HOSPITAL_COMMUNITY): Payer: Self-pay | Admitting: Physical Therapy

## 2020-04-21 ENCOUNTER — Ambulatory Visit (HOSPITAL_COMMUNITY): Payer: 59 | Admitting: Physical Therapy

## 2020-04-21 NOTE — Telephone Encounter (Signed)
Called pt re no show.  PT states that she called and left a message on the phone yesterday that she would not be able to be here therefore she does not want this to be considered a no show.  Pt reminded that her next visit is on Friday 10/22.   Virgina Organ, PT CLT [phone]

## 2020-04-22 NOTE — Telephone Encounter (Signed)
Please have her reach out to the provider that prescribes this medication for her to make sure this is a normal reaction.

## 2020-04-23 ENCOUNTER — Other Ambulatory Visit: Payer: Self-pay

## 2020-04-23 ENCOUNTER — Encounter (HOSPITAL_COMMUNITY): Payer: Self-pay

## 2020-04-23 ENCOUNTER — Ambulatory Visit (HOSPITAL_COMMUNITY): Payer: 59

## 2020-04-23 DIAGNOSIS — M545 Low back pain, unspecified: Secondary | ICD-10-CM | POA: Diagnosis not present

## 2020-04-23 DIAGNOSIS — M6281 Muscle weakness (generalized): Secondary | ICD-10-CM

## 2020-04-23 DIAGNOSIS — R29898 Other symptoms and signs involving the musculoskeletal system: Secondary | ICD-10-CM

## 2020-04-23 DIAGNOSIS — G8929 Other chronic pain: Secondary | ICD-10-CM
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
## 2020-04-26 ENCOUNTER — Ambulatory Visit (HOSPITAL_COMMUNITY): Payer: 59

## 2020-04-26 ENCOUNTER — Encounter (HOSPITAL_COMMUNITY): Payer: Self-pay

## 2020-04-26 ENCOUNTER — Other Ambulatory Visit: Payer: Self-pay

## 2020-04-26 DIAGNOSIS — M545 Low back pain, unspecified: Secondary | ICD-10-CM | POA: Diagnosis not present

## 2020-04-26 DIAGNOSIS — R29898 Other symptoms and signs involving the musculoskeletal system: Secondary | ICD-10-CM

## 2020-04-26 DIAGNOSIS — G8929 Other chronic pain: Secondary | ICD-10-CM

## 2020-04-26 DIAGNOSIS — M6281 Muscle weakness (generalized): Secondary | ICD-10-CM
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
## 2020-04-26 NOTE — Patient Instructions (Signed)
WALKING PROGRAM: Start walking outside 5 minutes in one direction and return 5 minutes to beginning for a total of 10 minutes walking.  The next week, add 5 minutes to total walking time.   Add 5 minutes each week until you can walk for 30 minutes total.   Chin Tuck    Tuck chin in, then down. Hold _3-5___ seconds. Relax. Repeat _10___ times. Do _1___ sessions per day.  http://gt2.exer.us/837   Copyright  VHI. All rights reserved.   Shoulder Retraction With Band    With band attached in front, pull arms back as if rowing a boat. Hold 2-3____ seconds. Repeat _10___ times. Do 1____ sessions per day.  Copyright  VHI. All rights reserved.   SHOULDER: Extension (Band)    Start with arm slightly forward. Holding band, pull backward, past hip, keeping elbow straight. Do not swing arm. Hold 2-3___ seconds. Use green band. 10___ reps per set, _1__ sets per day.  Copyright  VHI. All rights reserved.

## 2020-04-27 ENCOUNTER — Encounter: Payer: Self-pay | Admitting: Family Medicine

## 2020-04-27 ENCOUNTER — Telehealth (INDEPENDENT_AMBULATORY_CARE_PROVIDER_SITE_OTHER): Payer: 59 | Admitting: Family Medicine

## 2020-04-27 ENCOUNTER — Ambulatory Visit (INDEPENDENT_AMBULATORY_CARE_PROVIDER_SITE_OTHER): Payer: 59 | Admitting: Psychology

## 2020-04-27 VITALS — BP 122/81 | HR 76 | Ht 62.0 in | Wt 386.0 lb

## 2020-04-27 DIAGNOSIS — I10 Essential (primary) hypertension: Secondary | ICD-10-CM

## 2020-04-27 DIAGNOSIS — F419 Anxiety disorder, unspecified: Secondary | ICD-10-CM
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
## 2020-04-27 NOTE — Patient Instructions (Signed)
  HAPPY FALL!  I appreciate the opportunity to provide you with care for your health and wellness. Today we discussed: medication side effects   Follow up: as scheduled  No labs or referrals today  Try taking medication at night. If this does not help nausea and vomiting stop medication and message me.  I will send in your fluid pill separate again and  we will monitor your BP to see if we should try another medication  Please continue to practice social distancing to keep you, your family, and our community safe.  If you must go out, please wear a mask and practice good handwashing.  It was a pleasure to see you and I look forward to continuing to work together on your health and well-being. Please do not hesitate to call the office if you need care or have questions about your care.  Have a wonderful day and week. With Gratitude, Tereasa Coop, DNP, AGNP-BC

## 2020-04-27 NOTE — Assessment & Plan Note (Signed)
Was doing well on combo medication but reports now she is vomiting after taking it. She is taking it in the morning, is willing to try it at night and then follow up if not improved. Continue DASH diet and exercise.

## 2020-04-28 ENCOUNTER — Other Ambulatory Visit: Payer: Self-pay

## 2020-04-28 ENCOUNTER — Encounter (HOSPITAL_COMMUNITY): Payer: Self-pay

## 2020-04-28 ENCOUNTER — Ambulatory Visit (HOSPITAL_COMMUNITY): Payer: 59

## 2020-04-28 DIAGNOSIS — R29898 Other symptoms and signs involving the musculoskeletal system: Secondary | ICD-10-CM

## 2020-04-28 DIAGNOSIS — G8929 Other chronic pain: Secondary | ICD-10-CM

## 2020-04-28 DIAGNOSIS — M545 Low back pain, unspecified: Secondary | ICD-10-CM

## 2020-04-28 DIAGNOSIS — M6281 Muscle weakness (generalized): Secondary | ICD-10-CM

## 2020-04-28 NOTE — Patient Instructions (Signed)
Single Leg Balance: Eyes Open    Stand on right leg with eyes open. Hold 30 seconds. 5 reps 2 times per day.  http://ggbe.exer.us/5   Copyright  VHI. All rights reserved.   
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
## 2020-05-03 ENCOUNTER — Encounter (HOSPITAL_COMMUNITY): Payer: Self-pay | Admitting: Physical Therapy

## 2020-05-03 ENCOUNTER — Encounter (INDEPENDENT_AMBULATORY_CARE_PROVIDER_SITE_OTHER): Payer: Self-pay | Admitting: Family Medicine

## 2020-05-03 ENCOUNTER — Other Ambulatory Visit: Payer: Self-pay

## 2020-05-03 ENCOUNTER — Ambulatory Visit (HOSPITAL_COMMUNITY): Payer: 59 | Attending: Family Medicine | Admitting: Physical Therapy

## 2020-05-03 ENCOUNTER — Ambulatory Visit (INDEPENDENT_AMBULATORY_CARE_PROVIDER_SITE_OTHER): Payer: 59 | Admitting: Family Medicine

## 2020-05-03 VITALS — BP 122/74 | HR 75 | Temp 98.0°F | Ht 62.0 in | Wt 386.0 lb

## 2020-05-03 DIAGNOSIS — G43809 Other migraine, not intractable, without status migrainosus: Secondary | ICD-10-CM | POA: Diagnosis not present

## 2020-05-03 DIAGNOSIS — Z9189 Other specified personal risk factors, not elsewhere classified: Secondary | ICD-10-CM | POA: Diagnosis not present

## 2020-05-03 DIAGNOSIS — R600 Localized edema: Secondary | ICD-10-CM | POA: Diagnosis not present

## 2020-05-03 DIAGNOSIS — Z6841 Body Mass Index (BMI) 40.0 and over, adult: Secondary | ICD-10-CM

## 2020-05-03 DIAGNOSIS — I10 Essential (primary) hypertension: Secondary | ICD-10-CM | POA: Diagnosis not present

## 2020-05-03 DIAGNOSIS — F4329 Adjustment disorder with other symptoms: Secondary | ICD-10-CM

## 2020-05-03 DIAGNOSIS — G4726 Circadian rhythm sleep disorder, shift work type: Secondary | ICD-10-CM | POA: Diagnosis not present

## 2020-05-03 MED ORDER — PROPRANOLOL HCL 20 MG PO TABS: 20.0000 mg | ORAL_TABLET | Freq: Three times a day (TID) | ORAL | 0 refills | Status: DC | PRN

## 2020-05-03 MED ORDER — FUROSEMIDE 20 MG PO TABS: 20.0000 mg | ORAL_TABLET | Freq: Every day | ORAL | 0 refills | Status: DC | PRN

## 2020-05-03 NOTE — Progress Notes (Signed)
Chief Complaint:   OBESITY Jacqueline Collins is here to discuss her progress with her obesity treatment plan along with follow-up of her obesity related diagnoses.   Today's visit was #: 3 Starting weight: 391 lbs Starting date: 01/13/2020 Today's weight: 386 lbs Today's date: 05/03/2020 Total lbs lost to date: 5 lbs Body mass index is 70.6 kg/m.  Total weight loss percentage to date:  -1.28%  Nutrition Plan: the Category 3 Plan 0% of the time.  Anti-obesity medications: None.  Hunger is moderately controlled controlled. Cravings are moderately controlled controlled. [name] says she has not been eating any Gushers. Activity: PT 2 times per week. Sleep: Sleep is restful.   Assessment/Plan:   1. Essential hypertension The last time she took her medications was several days ago.  She decided to stop medications due to low blood pressures and dizziness.  She has been watching her salt intake and drinking more water.  Home pressure monitoring: Consistently 110-120/80s.  She denies any more dizziness or lightheadedness.  She denies chest pain, shortness of breath, headaches, edema.  Plan:   At goal. Plan: Avoid buying foods that are: processed, frozen, or prepackaged to avoid excess salt. We will continue to monitor symptoms as they relate to her weight loss journey.  BP Readings from Last 3 Encounters:  05/03/20 122/74  04/27/20 122/81  04/15/20 132/84   Lab Results  Component Value Date   CREATININE 0.77 01/13/2020   2. Lower extremity edema Mild. Intermittent. She would like something prn only.   Plan:  I have instructed her to be sure to take potassium 20 mEq along with Lasix.  She already has potassium at home.    - Start furosemide (LASIX) 20 MG tablet; Take 1 tablet (20 mg total) by mouth daily as needed for edema.  Dispense: 30 tablet; Refill: 0  3. Other migraine without status migrainosus, not intractable [name] is followed by Neurology for migraines.  She is taking Ajovy  monthly, with site reaction.  Plan:  She has previously been prescribed Nurtec (insurance would not cover).  Unsure if she has been on Topamax. Will consider going forward for weight management as well as migraine reduction.  4. Circadian rhythm sleep disorder, shift work type She is going to have a sleep study this month.  We will continue to monitor symptoms as they relate to her weight loss journey. Insufficient sleep is associated with obesity, DM2, CAD, HTN, and premature death. Metabolic and endocrine manifestations include decreased glucose tolerance, decreased insulin sensitivity, increased evening cortisol, increased ghrelin, and decreased leptin. Adults sleeping <= 5 h were 55% more likely to have obesity than those sleeping >5 hours. A reduction of 1 h of sleep is associated with 0.35kg/m2 increase in BMI. - Curr Obes Rep (2012) 1:245-256.   5. Adjustment disorder with other symptom Her cousin died last week, which has caused increased anxiety. She is already working with a therapist and has partner support.  Plan:  Start propranolol, as per below.  Continue therapy. We will continue to monitor symptoms as they relate to her weight loss journey.  - Start propranolol (INDERAL) 20 MG tablet; Take 1 tablet (20 mg total) by mouth 3 (three) times daily as needed.  Dispense: 20 tablet; Refill: 0  6. At risk for obstructive sleep apnea [name] was given approximately 15 minutes of coronary artery disease prevention counseling today. She is 34 y.o. female and has risk factors for obstructive sleep apnea including obesity. We discussed intensive lifestyle modifications  today with an emphasis on specific weight loss instructions and strategies.  7. Class 3 severe obesity with serious comorbidity and body mass index (BMI) greater than or equal to 70 in adult, unspecified obesity type (HCC)  [name] is currently in the action stage of change. As such, her goal is to continue with weight loss efforts.    Nutrition goals: She has agreed to practicing portion control and making smarter food choices, such as increasing vegetables and decreasing simple carbohydrates.   Exercise goals: For substantial health benefits, adults should do at least 150 minutes (2 hours and 30 minutes) a week of moderate-intensity, or 75 minutes (1 hour and 15 minutes) a week of vigorous-intensity aerobic physical activity, or an equivalent combination of moderate- and vigorous-intensity aerobic activity. Aerobic activity should be performed in episodes of at least 10 minutes, and preferably, it should be spread throughout the week.  Behavioral modification strategies: increasing lean protein intake, decreasing simple carbohydrates, increasing vegetables and increasing water intake.  Meah has agreed to follow-up with our clinic in 4 weeks. She was informed of the importance of frequent follow-up visits to maximize her success with intensive lifestyle modifications for her multiple health conditions.   Objective:   Blood pressure 122/74, pulse 75, temperature 98 F (36.7 C), temperature source Oral, height 5\' 2"  (1.575 m), weight (!) 386 lb (175.1 kg), SpO2 97 %. Body mass index is 70.6 kg/m.  General: Cooperative, alert, well developed, in no acute distress. HEENT: Conjunctivae and lids unremarkable. Cardiovascular: Regular rhythm.  Lungs: Normal work of breathing. Neurologic: No focal deficits.   Lab Results  Component Value Date   CREATININE 0.77 01/13/2020   BUN 12 01/13/2020   NA 139 01/13/2020   K 4.1 01/13/2020   CL 102 01/13/2020   CO2 25 01/13/2020   Lab Results  Component Value Date   ALT 13 01/13/2020   AST 12 01/13/2020   ALKPHOS 111 01/13/2020   BILITOT 0.4 01/13/2020   Lab Results  Component Value Date   HGBA1C 5.9 (A) 01/06/2020   HGBA1C 5.9 01/06/2020   HGBA1C 5.9 01/06/2020   HGBA1C 5.9 01/06/2020   Lab Results  Component Value Date   TSH 3.570 01/13/2020   Lab Results    Component Value Date   CHOL 175 01/13/2020   HDL 38 (L) 01/13/2020   LDLCALC 114 (H) 01/13/2020   TRIG 129 01/13/2020   Lab Results  Component Value Date   WBC 6.9 01/13/2020   HGB 12.4 01/13/2020   HCT 37.1 01/13/2020   MCV 94 01/13/2020   PLT 298 01/13/2020   Lab Results  Component Value Date   IRON 84 01/13/2020   TIBC 353 01/13/2020   FERRITIN 82 01/13/2020   Attestation Statements:   Reviewed by clinician on day of visit: allergies, medications, problem list, medical history, surgical history, family history, social history, and previous encounter notes.  I, 01/15/2020, CMA, am acting as transcriptionist for Insurance claims handler, DO  I have reviewed the above documentation for accuracy and completeness, and I agree with the above. Helane Rima, DO

## 2020-05-03 NOTE — Therapy (Signed)
[facility] [address] [place], [place], [address] Phone: [phone]   Fax:  [phone]  Patient Details  Name: Jacqueline Collins MRN: [id number] Date of Birth: 04/05/1986 Referring Provider:  No ref. provider found  Encounter Date: 05/03/2020    Patient no show, called to make her aware of missed appointment and remind her of next appointment. Made patient aware that her next scheduled appointment was in December and asked if she wanted to schedule something for this week. Patient stated she would check her schedule and she would call back to schedule something sooner.   2:37 PM, 05/03/20 Wyman Songster PT, DPT Physical Therapist at [facility]   [facility] [address] [place], [place], [address] Phone: [phone]   Fax:  [phone]

## 2020-05-05 ENCOUNTER — Encounter (HOSPITAL_COMMUNITY): Payer: 59

## 2020-05-10 ENCOUNTER — Ambulatory Visit (INDEPENDENT_AMBULATORY_CARE_PROVIDER_SITE_OTHER): Payer: 59 | Admitting: Neurology

## 2020-05-10 DIAGNOSIS — G4733 Obstructive sleep apnea (adult) (pediatric): Secondary | ICD-10-CM | POA: Diagnosis not present

## 2020-05-10 DIAGNOSIS — R519 Headache, unspecified: Secondary | ICD-10-CM

## 2020-05-10 DIAGNOSIS — Z720 Tobacco use: Secondary | ICD-10-CM

## 2020-05-10 DIAGNOSIS — G4709 Other insomnia: Secondary | ICD-10-CM

## 2020-05-10 DIAGNOSIS — G4726 Circadian rhythm sleep disorder, shift work type: Secondary | ICD-10-CM

## 2020-05-11 ENCOUNTER — Ambulatory Visit (INDEPENDENT_AMBULATORY_CARE_PROVIDER_SITE_OTHER): Payer: 59 | Admitting: Psychology

## 2020-05-11 DIAGNOSIS — F419 Anxiety disorder, unspecified: Secondary | ICD-10-CM

## 2020-05-12 ENCOUNTER — Ambulatory Visit (HOSPITAL_COMMUNITY): Payer: 59 | Admitting: Physical Therapy

## 2020-05-19 ENCOUNTER — Ambulatory Visit (INDEPENDENT_AMBULATORY_CARE_PROVIDER_SITE_OTHER): Payer: 59 | Admitting: Family Medicine

## 2020-05-19 ENCOUNTER — Other Ambulatory Visit: Payer: Self-pay

## 2020-05-19 ENCOUNTER — Encounter: Payer: Self-pay | Admitting: Family Medicine

## 2020-05-19 DIAGNOSIS — Z6841 Body Mass Index (BMI) 40.0 and over, adult: Secondary | ICD-10-CM | POA: Diagnosis not present

## 2020-05-19 DIAGNOSIS — I10 Essential (primary) hypertension: Secondary | ICD-10-CM | POA: Diagnosis not present

## 2020-05-19 DIAGNOSIS — F321 Major depressive disorder, single episode, moderate: Secondary | ICD-10-CM | POA: Diagnosis not present
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
## 2020-05-19 NOTE — Assessment & Plan Note (Signed)
Has completely stopped taking the hydrochlorothiazide lisinopril combo.  Is currently taking Inderal but that is mostly for anxiety and panic.  Lasix as needed.  Both have been prescribed by Dr. Earlene Plater.  I have encouraged a DASH diet and exercise.  Blood pressure when she takes at home appears to be in normal range.  But she is in the prediabetic range and I have discussed the need for possible medication if this number was to go to the diabetic range.  She is in agreements at this time and understands the situation.

## 2020-05-19 NOTE — Patient Instructions (Addendum)
°  HAPPY FALL!  I appreciate the opportunity to provide you with care for your health and wellness. Today we discussed: BP and mood   Follow up: 3 months   No labs   Try benadryl 1 hour prior to migraine shot, and then for the next 2-3 days afterwards. This might help you, but it might not change anything.  We will continue you off the BP medication given your home readings are stable. You are prediabetic, if you become diabetic you would be strongly recommended to be on something for BP  I am very sorry to here you loss another family member. Hopefully, the new therapist will help you.   Have a wonderful Holiday Season.  Please continue to practice social distancing to keep you, your family, and our community safe.  If you must go out, please wear a mask and practice good handwashing.  It was a pleasure to see you and I look forward to continuing to work together on your health and well-being. Please do not hesitate to call the office if you need care or have questions about your care.  Have a wonderful day and week. With Gratitude, Tereasa Coop, DNP, AGNP-BC

## 2020-05-19 NOTE — Assessment & Plan Note (Signed)
It is very difficult to put her on medications as she usually has side effects of them.  She has been on Topamax, Wellbutrin and Lexapro.  She does not want to start another medication at this time.  Though she would like help getting unemployment.  I have discussed that she needs to get on treatment whether that be with therapy or medication and I am happy to facilitate this.  She is willing to have me refer her to another therapist at this time.  She denies having any SI or HI at this time.

## 2020-05-19 NOTE — Assessment & Plan Note (Signed)
She is currently being followed by Dr. Earlene Plater at the [facility].  She did not do well on phentermine or Topamax.  I have encouraged her to increase her exercise, adjust her lifestyle avoid snacking in promote good eating habits.

## 2020-05-20 ENCOUNTER — Other Ambulatory Visit: Payer: Self-pay

## 2020-05-20 DIAGNOSIS — F321 Major depressive disorder, single episode, moderate: Secondary | ICD-10-CM

## 2020-05-21 ENCOUNTER — Emergency Department (HOSPITAL_COMMUNITY): Payer: 59

## 2020-05-21 ENCOUNTER — Other Ambulatory Visit: Payer: Self-pay

## 2020-05-21 ENCOUNTER — Emergency Department (HOSPITAL_COMMUNITY)
Admission: EM | Admit: 2020-05-21 | Discharge: 2020-05-21 | Disposition: A | Discharge status: Home / Self Care | Payer: 59 | Attending: Emergency Medicine | Admitting: Emergency Medicine

## 2020-05-21 ENCOUNTER — Encounter (HOSPITAL_COMMUNITY): Payer: Self-pay

## 2020-05-21 DIAGNOSIS — I10 Essential (primary) hypertension: Secondary | ICD-10-CM | POA: Diagnosis not present

## 2020-05-21 DIAGNOSIS — Z87891 Personal history of nicotine dependence: Secondary | ICD-10-CM | POA: Diagnosis not present

## 2020-05-21 DIAGNOSIS — M545 Low back pain, unspecified: Secondary | ICD-10-CM | POA: Diagnosis present

## 2020-05-21 DIAGNOSIS — J4520 Mild intermittent asthma, uncomplicated: Secondary | ICD-10-CM | POA: Diagnosis not present

## 2020-05-21 DIAGNOSIS — G8929 Other chronic pain: Secondary | ICD-10-CM | POA: Diagnosis not present

## 2020-05-21 DIAGNOSIS — X501XXA Overexertion from prolonged static or awkward postures, initial encounter: Secondary | ICD-10-CM | POA: Diagnosis not present

## 2020-05-21 DIAGNOSIS — Z79899 Other long term (current) drug therapy: Secondary | ICD-10-CM | POA: Insufficient documentation

## 2020-05-21 DIAGNOSIS — Y93E1 Activity, personal bathing and showering: Secondary | ICD-10-CM | POA: Insufficient documentation

## 2020-05-21 DIAGNOSIS — M5441 Lumbago with sciatica, right side: Secondary | ICD-10-CM

## 2020-05-21 MED ORDER — METHOCARBAMOL 750 MG PO TABS: 750.0000 mg | ORAL_TABLET | Freq: Every evening | ORAL | 0 refills | Status: AC | PRN

## 2020-05-21 MED ORDER — NAPROXEN 500 MG PO TABS: 500.0000 mg | ORAL_TABLET | Freq: Two times a day (BID) | ORAL | 0 refills | Status: AC

## 2020-05-21 MED ORDER — KETOROLAC TROMETHAMINE 30 MG/ML IJ SOLN
30.0000 mg | Freq: Once | INTRAMUSCULAR | Status: AC
  Administered 2020-05-21: 30 mg via INTRAMUSCULAR
  Filled 2020-05-21: qty 1

## 2020-05-21 NOTE — Discharge Instructions (Signed)

## 2020-05-21 NOTE — Procedures (Signed)
PATIENT'S NAME:  Jacqueline Collins, Jacqueline Collins DOB:      May 28, 1986      MR#:    [id number]     DATE OF RECORDING: 05/10/2020 AL REFERRING M.D.:  Helane Rima, DO @ [facility]  Study Performed:   Baseline Polysomnogram HISTORY:  [name], a 34 - year- old African American female patient was seen upon request of Dr Helane Rima in a sleep consultation on 04/15/2020.  Chief concern :  night shift worker, chronically sleep deprived, with super-obesity.    I have the pleasure of seeing your patient , [name], today in consultation, a right-handed [place] American female with medical history of Asthma, Asthma, Axillary lump (08/12/2013), Back pain, Chest pain, Encounter for examination following treatment at hospital (12/23/2019), GERD (gastroesophageal reflux disease)- on medication, , Joint pain, Lower extremity and back pain, ankle edema, Headache/ Migraines and occipital muscle spasms, Numbness, Super-Obesity, and SOB (shortness of breath).  [name] has been a night shift worker and is thus daytime sleep deprived -often accumulating less than 4 hours of sleep in daytime, night shift work has also been associated statistically with weight gain. 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 She has been known to snore, she often wakes up with headaches after sleep, and apneic episodes have been reported.  She also has trouble sleeping when reclined low, she cannot sleep well on a flat surface partially due to back pain. She also reports she cannot sleep well in prone position when she develops neck pain and her preferred sleep position is on her left side.  She has been treated for migraines in this office but probably has a component of occipital neuralgia.  She has developed ankle edema, chest pain, is  often short of breath with cough and wheezing. She has reported increased thirst, a feeling of hot and cold at night. Sleepiness and snoring have not led to longer sleep hours, partially due to her shift work, and partially to the endorsed history of depression and anxiety.      The patient endorsed the Epworth Sleepiness Scale at 11 points.   The patient's weight 386 pounds with a height of 62 (inches), resulting in a BMI of 71.0 kg/m2. The patient's neck circumference measured 16.5 inches.  CURRENT MEDICATIONS: Ventolin, Ajovy, Zestoretic, Mycolog, Prilosec, Drisdol   PROCEDURE:  This is a multichannel digital polysomnogram utilizing the Somnostar 11.2 system.  Electrodes and sensors were applied and monitored per AASM Specifications.   EEG, EOG, Chin and Limb EMG, were sampled at 200 Hz.  ECG, Snore and Nasal Pressure, Thermal Airflow, Respiratory Effort, CPAP Flow and Pressure, Oximetry was sampled at 50 Hz. Digital video and audio were recorded.      BASELINE STUDY: Lights Out was at 22:25 and Lights On at 04:59.  Total recording time (TRT) was 394.5 minutes, with a total sleep time (TST) of 266 minutes.   The patient's sleep latency was 71 minutes.  REM latency was 108 minutes.  The sleep efficiency was 67.4 %.     SLEEP ARCHITECTURE: WASO (Wake after sleep onset) was 111 minutes.  There were 76.5 minutes in Stage N1, 89.5 minutes Stage N2, 53 minutes Stage N3 and 47 minutes in Stage REM.  The percentage of Stage N1  was 28.8%, Stage N2 was 33.6%, Stage N3 was 19.9% and Stage R (REM sleep) was 17.7%.   RESPIRATORY ANALYSIS:  There were a total of 38 respiratory events: There were 38 hypopneas with a hypopnea index of 8.6 /hour.  The total APNEA/HYPOPNEA INDEX (AHI) was 8.6/hour.  23 events occurred in REM sleep and 30 events in NREM. The REM AHI was 29.4 /hour, versus a non-REM AHI of 4.1. The patient spent 144.5 minutes of total sleep time in the supine position and 122 minutes in non-supine.  The supine AHI was 6.2 versus a non-supine AHI of 11.4.  OXYGEN SATURATION & C02:  The Wake baseline 02 saturation was 96%, with the lowest being 78%. Time spent below 89% saturation equaled 7 minutes.  The arousals were noted as: 151 were spontaneous, 0 were associated with PLMs, 16 were associated with respiratory events. The patient had a total of 0 Periodic Limb Movements.   Audio and video analysis did not show any abnormal or unusual movements, phonations or vocalizations. The patient however used her cell phone twice during the night, after lights-out.   The patient took one bathroom break at 2.15 AM. At times loud, intermittent snoring was recorded. EKG was in keeping with normal sinus rhythm (NSR). IMPRESSION:  1. Mild Obstructive Sleep Apnea (OSA) at AHI of 8.6/h with mainly hypopneas= hypoventilation, but no prolonged hypoxia (total 8 minutes) or irregular pulses.  2. Strongly REM sleep dependent apnea form with REM AHI of 29.4/h.  3. Snoring. 4. Dysfunctions associated with sleep habits, sleep hygiene as well as circadian rhythm.    RECOMMENDATIONS:  1. Advise treatment of REM sleep dependent obstructive sleep apnea (OSA) with auto CPAP at pressure settings from 6 through 18 cm water, 3 cm EPR. 2. Revisit with NP in sleep clinic to review CPAP affect on Sleepiness, fatigue and headaches.   I certify that I have reviewed the entire raw data recording prior to the issuance of this report in accordance with the Standards of Accreditation of the American Academy of Sleep Medicine (AASM)  Melvyn Novas, MD Diplomat, American Board of Psychiatry and Neurology  Diplomat, American Board of Sleep Medicine Medical Director, [facility] (Sleep Medicine)

## 2020-05-21 NOTE — Addendum Note (Signed)
Addended by: Melvyn Novas on: 05/21/2020 01:01 PM   Modules accepted: Orders

## 2020-05-21 NOTE — ED Notes (Signed)
Entered room and introduced self to patient at this time. Pt appears uncomfortable in bed, no signs of distress noted. Respirations are even and unlabored with equal chest rise and fall. Bed is locked in the lowest position, side rails x2, call bell within reach and family at bedside. Educated on call light and hourly rounding, in agreement at this time.
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
## 2020-05-21 NOTE — ED Triage Notes (Signed)
Pt. Complains of back pain since yesterday after dropping soap in the shower and bending over to pick it up. Pt. States they haven't been able to sleep due to the pain.

## 2020-05-21 NOTE — Progress Notes (Signed)
IMPRESSION:   1. Mild Obstructive Sleep Apnea (OSA) at AHI of 8.6/h with mainly  hypopneas= hypoventilation, but no prolonged hypoxia (total 8  minutes) or irregular pulses.  2. Strongly REM sleep dependent apnea form with REM AHI of  29.4/h.  3. Snoring.  4. Dysfunctions associated with sleep habits, sleep hygiene as  well as circadian rhythm.    RECOMMENDATIONS:   1. Advise treatment of REM sleep dependent obstructive sleep  apnea (OSA) with auto CPAP at pressure settings from 6 through 18  cm water, 3 cm EPR.  Please inform patient of the supply chain strain , affecting CPAP delivery time. It may take 3 month to get a CPAP.  2. Revisit with NP in sleep clinic to review CPAP affect on sleepiness, fatigue and headaches.

## 2020-05-21 NOTE — ED Notes (Signed)
Patient transported to X-ray 

## 2020-05-22 ENCOUNTER — Encounter (INDEPENDENT_AMBULATORY_CARE_PROVIDER_SITE_OTHER): Payer: Self-pay | Admitting: Family Medicine

## 2020-05-22 DIAGNOSIS — G4733 Obstructive sleep apnea (adult) (pediatric): Secondary | ICD-10-CM | POA: Insufficient documentation

## 2020-05-24 ENCOUNTER — Telehealth: Payer: Self-pay

## 2020-05-24 ENCOUNTER — Encounter: Payer: Self-pay | Admitting: Neurology

## 2020-05-24 NOTE — Telephone Encounter (Signed)
Pt went to the ER today for back pain. They did an xray and it was normal. She ended up leaving after that because of the wait time. She says she has seen you for this before and you referred her to PT which hasn't helped much. She wanted to know what the next steps are? Please advise.

## 2020-05-25 NOTE — Telephone Encounter (Signed)
Referral to ortho

## 2020-05-26 ENCOUNTER — Encounter: Payer: Self-pay | Admitting: Orthopaedic Surgery

## 2020-05-26 ENCOUNTER — Other Ambulatory Visit: Payer: Self-pay

## 2020-05-26 DIAGNOSIS — M545 Low back pain, unspecified: Secondary | ICD-10-CM

## 2020-05-26 NOTE — Telephone Encounter (Signed)
Referral order placed. Unable to inform pt, number no longer in service.

## 2020-05-26 NOTE — Telephone Encounter (Signed)
She said she is ok with ortho referral but she wants to stay in [place].

## 2020-05-26 NOTE — Telephone Encounter (Signed)
Lets get her into Ortho here in town

## 2020-06-02 ENCOUNTER — Telehealth: Payer: Self-pay

## 2020-06-02 ENCOUNTER — Encounter: Payer: Self-pay | Admitting: Neurology

## 2020-06-02 ENCOUNTER — Telehealth: Payer: Self-pay | Admitting: Neurology

## 2020-06-02 NOTE — Telephone Encounter (Signed)
Pt returned call and I was able to review SSR.  I advised pt that Dr. Vickey Huger reviewed their sleep study results and found that pt has mild sleep apnea. Dr. Vickey Huger recommends that pt starts auto CPAP. I reviewed PAP compliance expectations with the pt. Pt is agreeable to starting a CPAP. I advised pt that an order will be sent to a DME, [facility] and [facility] will call the pt within about one week after they file with the pt's insurance. [facility]) will show the pt how to use the machine, fit for masks, and troubleshoot the CPAP if needed. A follow up appt will need to be made for insurance purposes with Dr. Vickey Huger or NP. Pt verbalized understanding to call our office and schedule a follow up visit within 31-90 days from the date scheduled to pick up her machine. A letter with all of this information in it will be sent to the pt as a reminder through mychart. Pt verbalized understanding of results. Pt had no questions at this time but was encouraged to call back if questions arise. I have sent the order to [facility])  and have received confirmation that they have received the order.

## 2020-06-02 NOTE — Telephone Encounter (Signed)
Pt called v/m stating she was coughing up blood again and wanted to know what to do. Called pt back with no answer but I left her a detailed v/m instructing her to go to the ER.

## 2020-06-03 NOTE — Telephone Encounter (Signed)
Noted, thank you for telling her to go to the ED. That is best with those symptoms.

## 2020-06-07 ENCOUNTER — Encounter (INDEPENDENT_AMBULATORY_CARE_PROVIDER_SITE_OTHER): Payer: Self-pay

## 2020-06-07 ENCOUNTER — Ambulatory Visit (INDEPENDENT_AMBULATORY_CARE_PROVIDER_SITE_OTHER): Payer: 59 | Admitting: Family Medicine

## 2020-06-09 ENCOUNTER — Ambulatory Visit (HOSPITAL_COMMUNITY): Payer: 59 | Admitting: Physical Therapy

## 2020-06-16 ENCOUNTER — Ambulatory Visit (HOSPITAL_COMMUNITY): Payer: 59 | Admitting: Physical Therapy

## 2020-06-16 ENCOUNTER — Encounter (HOSPITAL_COMMUNITY): Payer: 59 | Admitting: Physical Therapy

## 2020-06-16 ENCOUNTER — Other Ambulatory Visit: Payer: Self-pay

## 2020-06-16 ENCOUNTER — Encounter: Payer: Self-pay | Admitting: Orthopedic Surgery

## 2020-06-16 ENCOUNTER — Ambulatory Visit (INDEPENDENT_AMBULATORY_CARE_PROVIDER_SITE_OTHER): Payer: 59 | Admitting: Orthopedic Surgery

## 2020-06-16 VITALS — BP 144/100 | HR 84 | Ht 62.0 in | Wt 392.0 lb

## 2020-06-16 DIAGNOSIS — Z6841 Body Mass Index (BMI) 40.0 and over, adult: Secondary | ICD-10-CM | POA: Diagnosis not present

## 2020-06-16 DIAGNOSIS — M545 Low back pain, unspecified: Secondary | ICD-10-CM

## 2020-06-16 MED ORDER — CYCLOBENZAPRINE HCL 10 MG PO TABS: 10.0000 mg | ORAL_TABLET | Freq: Two times a day (BID) | ORAL | 0 refills | Status: DC | PRN
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
## 2020-06-30 ENCOUNTER — Ambulatory Visit: Payer: 59 | Admitting: Family Medicine

## 2020-07-05 ENCOUNTER — Telehealth (INDEPENDENT_AMBULATORY_CARE_PROVIDER_SITE_OTHER): Payer: 59 | Admitting: Family Medicine

## 2020-07-05 ENCOUNTER — Encounter: Payer: Self-pay | Admitting: Family Medicine

## 2020-07-05 DIAGNOSIS — G4726 Circadian rhythm sleep disorder, shift work type: Secondary | ICD-10-CM

## 2020-07-05 DIAGNOSIS — G43709 Chronic migraine without aura, not intractable, without status migrainosus: Secondary | ICD-10-CM

## 2020-07-05 DIAGNOSIS — G4733 Obstructive sleep apnea (adult) (pediatric): Secondary | ICD-10-CM

## 2020-07-05 NOTE — Patient Instructions (Signed)
Below is our plan:  We will continue Ajovy. Try Benadryl about 30 minutes before taking injection to help with skin reaction. Please contact DME if you have not heard back by the end of the week. We will need to follow up 31-90 days following set up of CPAP.   Please make sure you are staying well hydrated. I recommend 50-60 ounces daily. Well balanced diet and regular exercise encouraged.   Please continue follow up with care team as directed.   You may receive a survey regarding today's visit. I encourage you to leave honest feed back as I do use this information to improve patient care. Thank you for seeing me today!       Sleep Apnea Sleep apnea affects breathing during sleep. It causes breathing to stop for a short time or to become shallow. It can also increase the risk of:  Heart attack.  Stroke.  Being very overweight (obese).  Diabetes.  Heart failure.-  Irregular heartbeat. The goal of treatment is to help you breathe normally again. What are the causes? There are three kinds of sleep apnea:  Obstructive sleep apnea. This is caused by a blocked or collapsed airway.  Central sleep apnea. This happens when the brain does not send the right signals to the muscles that control breathing.  Mixed sleep apnea. This is a combination of obstructive and central sleep apnea. The most common cause of this condition is a collapsed or blocked airway. This can happen if:  Your throat muscles are too relaxed.  Your tongue and tonsils are too large.  You are overweight.  Your airway is too small. What increases the risk?  Being overweight.  Smoking.  Having a small airway.  Being older.  Being female.  Drinking alcohol.  Taking medicines to calm yourself (sedatives or tranquilizers).  Having family members with the condition. What are the signs or symptoms?  Trouble staying asleep.  Being sleepy or tired during the day.  Getting angry a lot.  Loud  snoring.  Headaches in the morning.  Not being able to focus your mind (concentrate).  Forgetting things.  Less interest in sex.  Mood swings.  Personality changes.  Feelings of sadness (depression).  Waking up a lot during the night to pee (urinate).  Dry mouth.  Sore throat. How is this diagnosed?  Your medical history.  A physical exam.  A test that is done when you are sleeping (sleep study). The test is most often done in a sleep lab but may also be done at home. How is this treated?   Sleeping on your side.  Using a medicine to get rid of mucus in your nose (decongestant).  Avoiding the use of alcohol, medicines to help you relax, or certain pain medicines (narcotics).  Losing weight, if needed.  Changing your diet.  Not smoking.  Using a machine to open your airway while you sleep, such as: ? An oral appliance. This is a mouthpiece that shifts your lower jaw forward. ? A CPAP device. This device blows air through a mask when you breathe out (exhale). ? An EPAP device. This has valves that you put in each nostril. ? A BPAP device. This device blows air through a mask when you breathe in (inhale) and breathe out.  Having surgery if other treatments do not work. It is important to get treatment for sleep apnea. Without treatment, it can lead to:  High blood pressure.  Coronary artery disease.  In men, not  being able to have an erection (impotence).  Reduced thinking ability. Follow these instructions at home: Lifestyle  Make changes that your doctor recommends.  Eat a healthy diet.  Lose weight if needed.  Avoid alcohol, medicines to help you relax, and some pain medicines.  Do not use any products that contain nicotine or tobacco, such as cigarettes, e-cigarettes, and chewing tobacco. If you need help quitting, ask your doctor. General instructions  Take over-the-counter and prescription medicines only as told by your doctor.  If you  were given a machine to use while you sleep, use it only as told by your doctor.  If you are having surgery, make sure to tell your doctor you have sleep apnea. You may need to bring your device with you.  Keep all follow-up visits as told by your doctor. This is important. Contact a doctor if:  The machine that you were given to use during sleep bothers you or does not seem to be working.  You do not get better.  You get worse. Get help right away if:  Your chest hurts.  You have trouble breathing in enough air.  You have an uncomfortable feeling in your back, arms, or stomach.  You have trouble talking.  One side of your body feels weak.  A part of your face is hanging down. These symptoms may be an emergency. Do not wait to see if the symptoms will go away. Get medical help right away. Call your local emergency services (911 in the [place] Do not drive yourself to the hospital. Summary  This condition affects breathing during sleep.  The most common cause is a collapsed or blocked airway.  The goal of treatment is to help you breathe normally while you sleep. This information is not intended to replace advice given to you by your health care provider. Make sure you discuss any questions you have with your health care provider. Document Revised: 04/05/2018 Document Reviewed: 02/12/2018 Elsevier Patient Education  [place].   Migraine Headache A migraine headache is a very strong throbbing pain on one side or both sides of your head. This type of headache can also cause other symptoms. It can last from 4 hours to 3 days. Talk with your doctor about what things may bring on (trigger) this condition. What are the causes? The exact cause of this condition is not known. This condition may be triggered or caused by:  Drinking alcohol.  Smoking.  Taking medicines, such as: ? Medicine used to treat chest pain (nitroglycerin). ? Birth control  pills. ? Estrogen. ? Some blood pressure medicines.  Eating or drinking certain products.  Doing physical activity. Other things that may trigger a migraine headache include:  Having a menstrual period.  Pregnancy.  Hunger.  Stress.  Not getting enough sleep or getting too much sleep.  Weather changes.  Tiredness (fatigue). What increases the risk?  Being 81-81 years old.  Being female.  Having a family history of migraine headaches.  Being Caucasian.  Having depression or anxiety.  Being very overweight. What are the signs or symptoms?  A throbbing pain. This pain may: ? Happen in any area of the head, such as on one side or both sides. ? Make it hard to do daily activities. ? Get worse with physical activity. ? Get worse around bright lights or loud noises.  Other symptoms may include: ? Feeling sick to your stomach (nauseous). ? Vomiting. ? Dizziness. ? Being sensitive to bright lights, loud  noises, or smells.  Before you get a migraine headache, you may get warning signs (an aura). An aura may include: ? Seeing flashing lights or having blind spots. ? Seeing bright spots, halos, or zigzag lines. ? Having tunnel vision or blurred vision. ? Having numbness or a tingling feeling. ? Having trouble talking. ? Having weak muscles.  Some people have symptoms after a migraine headache (postdromal phase), such as: ? Tiredness. ? Trouble thinking (concentrating). How is this treated?  Taking medicines that: ? Relieve pain. ? Relieve the feeling of being sick to your stomach. ? Prevent migraine headaches.  Treatment may also include: ? Having acupuncture. ? Avoiding foods that bring on migraine headaches. ? Learning ways to control your body functions (biofeedback). ? Therapy to help you know and deal with negative thoughts (cognitive behavioral therapy). Follow these instructions at home: Medicines  Take over-the-counter and prescription medicines  only as told by your doctor.  Ask your doctor if the medicine prescribed to you: ? Requires you to avoid driving or using heavy machinery. ? Can cause trouble pooping (constipation). You may need to take these steps to prevent or treat trouble pooping:  Drink enough fluid to keep your pee (urine) pale yellow.  Take over-the-counter or prescription medicines.  Eat foods that are high in fiber. These include beans, whole grains, and fresh fruits and vegetables.  Limit foods that are high in fat and sugar. These include fried or sweet foods. Lifestyle  Do not drink alcohol.  Do not use any products that contain nicotine or tobacco, such as cigarettes, e-cigarettes, and chewing tobacco. If you need help quitting, ask your doctor.  Get at least 8 hours of sleep every night.  Limit and deal with stress. General instructions      Keep a journal to find out what may bring on your migraine headaches. For example, write down: ? What you eat and drink. ? How much sleep you get. ? Any change in what you eat or drink. ? Any change in your medicines.  If you have a migraine headache: ? Avoid things that make your symptoms worse, such as bright lights. ? It may help to lie down in a dark, quiet room. ? Do not drive or use heavy machinery. ? Ask your doctor what activities are safe for you.  Keep all follow-up visits as told by your doctor. This is important. Contact a doctor if:  You get a migraine headache that is different or worse than others you have had.  You have more than 15 headache days in one month. Get help right away if:  Your migraine headache gets very bad.  Your migraine headache lasts longer than 72 hours.  You have a fever.  You have a stiff neck.  You have trouble seeing.  Your muscles feel weak or like you cannot control them.  You start to lose your balance a lot.  You start to have trouble walking.  You pass out (faint).  You have a  seizure. Summary  A migraine headache is a very strong throbbing pain on one side or both sides of your head. These headaches can also cause other symptoms.  This condition may be treated with medicines and changes to your lifestyle.  Keep a journal to find out what may bring on your migraine headaches.  Contact a doctor if you get a migraine headache that is different or worse than others you have had.  Contact your doctor if you have more  than 15 headache days in a month. This information is not intended to replace advice given to you by your health care provider. Make sure you discuss any questions you have with your health care provider. Document Revised: 10/11/2018 Document Reviewed: 08/01/2018 Elsevier Patient Education  2020 [employer].
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
## 2020-08-12 ENCOUNTER — Other Ambulatory Visit: Payer: Self-pay

## 2020-08-12 ENCOUNTER — Ambulatory Visit (INDEPENDENT_AMBULATORY_CARE_PROVIDER_SITE_OTHER): Payer: 59 | Admitting: Internal Medicine

## 2020-08-12 ENCOUNTER — Encounter: Payer: Self-pay | Admitting: Internal Medicine

## 2020-08-12 VITALS — BP 131/86 | HR 90 | Temp 98.6°F | Resp 18 | Ht 62.0 in | Wt 398.0 lb

## 2020-08-12 DIAGNOSIS — Z6841 Body Mass Index (BMI) 40.0 and over, adult: Secondary | ICD-10-CM | POA: Diagnosis not present

## 2020-08-12 DIAGNOSIS — K5904 Chronic idiopathic constipation: Secondary | ICD-10-CM

## 2020-08-12 DIAGNOSIS — R600 Localized edema: Secondary | ICD-10-CM | POA: Insufficient documentation

## 2020-08-12 MED ORDER — LACTULOSE 10 GM/15ML PO SOLN: 10.0000 g | Freq: Every day | ORAL | 1 refills | Status: DC | PRN

## 2020-08-12 MED ORDER — FUROSEMIDE 20 MG PO TABS: 20.0000 mg | ORAL_TABLET | Freq: Every day | ORAL | 0 refills | Status: DC | PRN
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
## 2020-08-12 NOTE — Patient Instructions (Signed)
Please get fasting blood tests done before the next visit.  Please try to keep legs elevated at nighttime. Apply compression stockings for leg swelling.  Try to stay hydrated by taking at least 60 ounces of fluid in a day. It would help with constipation as well. Please start taking Lactulose for constipation.

## 2020-08-19 ENCOUNTER — Ambulatory Visit: Payer: 59 | Admitting: Family Medicine

## 2020-08-23 ENCOUNTER — Other Ambulatory Visit (INDEPENDENT_AMBULATORY_CARE_PROVIDER_SITE_OTHER): Payer: Self-pay | Admitting: Family Medicine

## 2020-08-23 DIAGNOSIS — E559 Vitamin D deficiency, unspecified: Secondary | ICD-10-CM

## 2020-08-24 ENCOUNTER — Ambulatory Visit (INDEPENDENT_AMBULATORY_CARE_PROVIDER_SITE_OTHER): Payer: 59 | Admitting: Nurse Practitioner

## 2020-08-24 ENCOUNTER — Other Ambulatory Visit: Payer: Self-pay

## 2020-08-24 ENCOUNTER — Encounter: Payer: Self-pay | Admitting: Nurse Practitioner

## 2020-08-24 DIAGNOSIS — K046 Periapical abscess with sinus: Secondary | ICD-10-CM | POA: Diagnosis not present

## 2020-08-24 MED ORDER — AMOXICILLIN-POT CLAVULANATE 875-125 MG PO TABS: 1.0000 | ORAL_TABLET | Freq: Two times a day (BID) | ORAL | 0 refills | Status: DC
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
## 2020-08-24 NOTE — Assessment & Plan Note (Signed)
-  Rx. augmentin -will get labs today to check blood count

## 2020-08-25 LAB — CMP14+EGFR
ALT: 9 IU/L (ref 0–32)
AST: 10 IU/L (ref 0–40)
Albumin/Globulin Ratio: 1.4 (ref 1.2–2.2)
Albumin: 4.2 g/dL (ref 3.8–4.8)
Alkaline Phosphatase: 117 IU/L (ref 44–121)
BUN/Creatinine Ratio: 8 — ABNORMAL LOW (ref 9–23)
BUN: 7 mg/dL (ref 6–20)
Bilirubin Total: 0.4 mg/dL (ref 0.0–1.2)
CO2: 22 mmol/L (ref 20–29)
Calcium: 9.4 mg/dL (ref 8.7–10.2)
Chloride: 101 mmol/L (ref 96–106)
Creatinine, Ser: 0.84 mg/dL (ref 0.57–1.00)
GFR calc Af Amer: 106 mL/min/{1.73_m2} (ref >59)
GFR calc non Af Amer: 92 mL/min/{1.73_m2} (ref >59)
Globulin, Total: 3 g/dL (ref 1.5–4.5)
Glucose: 118 mg/dL — ABNORMAL HIGH (ref 65–99)
Potassium: 4.4 mmol/L (ref 3.5–5.2)
Sodium: 138 mmol/L (ref 134–144)
Total Protein: 7.2 g/dL (ref 6.0–8.5)

## 2020-08-25 LAB — CBC WITH DIFFERENTIAL/PLATELET
Basophils Absolute: 0 10*3/uL (ref 0.0–0.2)
Basos: 0 %
EOS (ABSOLUTE): 0.2 10*3/uL (ref 0.0–0.4)
Eos: 2 %
Hematocrit: 38 % (ref 34.0–46.6)
Hemoglobin: 12.8 g/dL (ref 11.1–15.9)
Immature Grans (Abs): 0 10*3/uL (ref 0.0–0.1)
Immature Granulocytes: 0 %
Lymphocytes Absolute: 2.4 10*3/uL (ref 0.7–3.1)
Lymphs: 29 %
MCH: 31.4 pg (ref 26.6–33.0)
MCHC: 33.7 g/dL (ref 31.5–35.7)
MCV: 93 fL (ref 79–97)
Monocytes Absolute: 0.4 10*3/uL (ref 0.1–0.9)
Monocytes: 4 %
Neutrophils Absolute: 5.3 10*3/uL (ref 1.4–7.0)
Neutrophils: 65 %
Platelets: 327 10*3/uL (ref 150–450)
RBC: 4.08 x10E6/uL (ref 3.77–5.28)
RDW: 13.1 % (ref 11.7–15.4)
WBC: 8.3 10*3/uL (ref 3.4–10.8)

## 2020-08-25 NOTE — Progress Notes (Signed)
Her labs are great. No anemia on her labs.

## 2020-09-18 ENCOUNTER — Other Ambulatory Visit: Payer: Self-pay | Admitting: Internal Medicine

## 2020-09-18 DIAGNOSIS — K5904 Chronic idiopathic constipation: Secondary | ICD-10-CM

## 2020-09-22 ENCOUNTER — Telehealth: Payer: Self-pay | Admitting: *Deleted

## 2020-09-22 NOTE — Telephone Encounter (Signed)
Per [employer], "The requested medication and/or diagnosis are not a covered benefit and excluded from coverage in accordance with the terms and conditions of your plan benefit. Therefore, the request has been administratively denied." If we should choose to appeal, fax within 180 calendar days to [phone]. Case number: [id number].

## 2020-09-22 NOTE — Telephone Encounter (Signed)
Completed Ajovy PA on Cover My Meds. Key: [id number]. PA Case ID: [id number]. Awaiting determination from [employer].

## 2020-09-22 NOTE — Telephone Encounter (Signed)
Looks like she has only been on Ajovy. I am happy to switch to either Amovig or Emgality. If history of constipation, Emgality is better choice but will depend on insurance coverage. She may continue Ajovy if willing to pay $50 copay.

## 2020-09-25 ENCOUNTER — Other Ambulatory Visit: Payer: Self-pay | Admitting: Internal Medicine

## 2020-09-25 DIAGNOSIS — R600 Localized edema: Secondary | ICD-10-CM

## 2020-09-27 ENCOUNTER — Encounter (HOSPITAL_COMMUNITY): Payer: Self-pay | Admitting: Physical Therapy

## 2020-09-27 NOTE — Therapy (Signed)
[facility] [address] [place], [place], [address] Phone: [phone]   Fax:  [phone]  Patient Details  Name: Jacqueline Collins MRN: [id number] Date of Birth: 05/16/1986 Referring Provider:  No ref. provider found  Encounter Date: 09/27/2020  PHYSICAL THERAPY DISCHARGE SUMMARY  Visits from Start of Care: 5  Current functional level related to goals / functional outcomes: Unknown as patient has not returned.   Remaining deficits: Unknown as patient has not returned   Education / Equipment: HEP  Plan: Patient agrees to discharge.  Patient goals were not met. Patient is being discharged due to not returning since the last visit.  ?????        11:13 AM, 09/27/20 Andrew S. Zaunegger PT, DPT Physical Therapist at [facility]   [facility] [address] [place], [place], [address] Phone: [phone]   Fax:  [phone] 

## 2020-09-30 ENCOUNTER — Other Ambulatory Visit: Payer: Self-pay

## 2020-09-30 ENCOUNTER — Ambulatory Visit (INDEPENDENT_AMBULATORY_CARE_PROVIDER_SITE_OTHER): Payer: 59 | Admitting: Family Medicine

## 2020-09-30 ENCOUNTER — Encounter (INDEPENDENT_AMBULATORY_CARE_PROVIDER_SITE_OTHER): Payer: Self-pay | Admitting: Family Medicine

## 2020-09-30 VITALS — BP 130/82 | HR 82 | Temp 98.5°F | Ht 62.0 in | Wt >= 6400 oz

## 2020-09-30 DIAGNOSIS — K5904 Chronic idiopathic constipation: Secondary | ICD-10-CM

## 2020-09-30 DIAGNOSIS — F331 Major depressive disorder, recurrent, moderate: Secondary | ICD-10-CM

## 2020-09-30 DIAGNOSIS — Z9189 Other specified personal risk factors, not elsewhere classified: Secondary | ICD-10-CM

## 2020-09-30 DIAGNOSIS — R600 Localized edema: Secondary | ICD-10-CM | POA: Diagnosis not present

## 2020-09-30 DIAGNOSIS — Z6841 Body Mass Index (BMI) 40.0 and over, adult: Secondary | ICD-10-CM

## 2020-09-30 DIAGNOSIS — G894 Chronic pain syndrome: Secondary | ICD-10-CM

## 2020-09-30 DIAGNOSIS — E559 Vitamin D deficiency, unspecified: Secondary | ICD-10-CM | POA: Diagnosis not present

## 2020-09-30 DIAGNOSIS — Z800 Family history of malignant neoplasm of digestive organs: Secondary | ICD-10-CM

## 2020-09-30 NOTE — Progress Notes (Signed)
Chief Complaint:   OBESITY Jacqueline Collins is here to discuss her progress with her obesity treatment plan along with follow-up of her obesity related diagnoses.   Today's visit was #: 4 Starting weight: 391 lbs Starting date: 01/13/2020 Today's weight: 401 lbs Today's date: 09/30/2020 Body mass index is 73.34 kg/m.   Interim History:  Jacqueline Collins is looking for a new psychologist.  She says that her old one was a "yes" person.  She prefers someone who will challenge her. Current Meal Plan: practicing portion control and making smarter food choices, such as increasing vegetables and decreasing simple carbohydrates for 0% of the time.  Current Exercise Plan: Stretching for 30 minutes 3-4 times per week.  Assessment/Plan:   1. Chronic idiopathic constipation This problem is uncontrolled. Jacqueline Collins was informed that a decrease in bowel movement frequency is normal while losing weight, but stools should not be hard or painful.  Counseling: Getting to Good Bowel Health: Your goal is to have one soft bowel movement each day. Drink at least 8 glasses of water each day. Eat plenty of fiber (goal is over 30 grams each day). It is best to get most of your fiber from dietary sources which includes leafy green vegetables, fresh fruit, and whole grains. You may need to add fiber with the help of OTC fiber supplements. These include Metamucil, Citrucel, and Benefiber. If you are still having trouble, try adding an osmotic laxative such as Miralax. If all of these changes do not work, Dietitian.   2. Lower extremity edema Jacqueline Collins takes Lasix 20 mg daily as needed for edema.  Plan:  Continue Lasix for edema.  3. Vitamin D deficiency Not at goal. Current vitamin D is 14.2, tested on 01/13/2020. Optimal goal > 50 ng/dL.  She is taking vitamin D 50,000 IU weekly.  Plan: Continue to take prescription Vitamin D @50 ,000 IU every week as prescribed.  Follow-up for routine testing of Vitamin D, at least 2-3  times per year to avoid over-replacement.  4. Chronic pain disorder We will continue to monitor symptoms as they relate to her weight loss journey.  5. Family history of colon cancer in mother Jacqueline Collins's mother was diagnosed with colon cancer when she was in her 22s.  6. Moderate episode of recurrent major depressive disorder (HCC) Jacqueline Collins is struggling with emotional eating and using food for comfort to the extent that it is negatively impacting her health. She has been working on behavior modification techniques to help reduce her emotional eating. She shows no sign of suicidal or homicidal ideations.  She takes propranolol 20 mg three times daily as needed for anxiety.  Plan:  Continue propranolol.  Behavior modification techniques were discussed today to help Jacqueline Collins deal with her emotional/non-hunger eating behaviors.    7. At risk for heart disease Due to Jacqueline Collins's current state of health and medical condition(s), she is at a higher risk for heart disease.  This puts the patient at much greater risk to subsequently develop cardiopulmonary conditions that can significantly affect patient's quality of life in a negative manner.    At least 8 minutes were spent on counseling Jacqueline Collins about these concerns today. Evidence-based interventions for health behavior change were utilized today including the discussion of self monitoring techniques, problem-solving barriers, and SMART goal setting techniques.  Specifically, regarding patient's less desirable eating habits and patterns, we employed the technique of small changes when Jacqueline Collins has not been able to fully commit to her prudent nutritional plan.  8. Obesity, current BMI 73.34  Course: Jacqueline Collins is currently in the action stage of change. As such, her goal is to continue with weight loss efforts.   Nutrition goals: She has agreed to practicing portion control and making smarter food choices, such as increasing vegetables and decreasing simple  carbohydrates.   Exercise goals: For substantial health benefits, adults should do at least 150 minutes (2 hours and 30 minutes) a week of moderate-intensity, or 75 minutes (1 hour and 15 minutes) a week of vigorous-intensity aerobic physical activity, or an equivalent combination of moderate- and vigorous-intensity aerobic activity. Aerobic activity should be performed in episodes of at least 10 minutes, and preferably, it should be spread throughout the week.  Behavioral modification strategies: increasing lean protein intake, decreasing simple carbohydrates, increasing vegetables, increasing water intake and decreasing liquid calories.  Jacqueline Collins has agreed to follow-up with our clinic in 3 weeks. She was informed of the importance of frequent follow-up visits to maximize her success with intensive lifestyle modifications for her multiple health conditions.   Objective:   Blood pressure 130/82, pulse 82, temperature 98.5 F (36.9 C), height 5\' 2"  (1.575 m), weight (!) 401 lb (181.9 kg), last menstrual period 08/17/2020, SpO2 97 %. Body mass index is 73.34 kg/m.  General: Cooperative, alert, well developed, in no acute distress. HEENT: Conjunctivae and lids unremarkable. Cardiovascular: Regular rhythm.  Lungs: Normal work of breathing. Neurologic: No focal deficits.   Lab Results  Component Value Date   CREATININE 0.84 08/24/2020   BUN 7 08/24/2020   NA 138 08/24/2020   K 4.4 08/24/2020   CL 101 08/24/2020   CO2 22 08/24/2020   Lab Results  Component Value Date   ALT 9 08/24/2020   AST 10 08/24/2020   ALKPHOS 117 08/24/2020   BILITOT 0.4 08/24/2020   Lab Results  Component Value Date   HGBA1C 5.9 (A) 01/06/2020   HGBA1C 5.9 01/06/2020   HGBA1C 5.9 01/06/2020   HGBA1C 5.9 01/06/2020   Lab Results  Component Value Date   TSH 3.570 01/13/2020   Lab Results  Component Value Date   CHOL 175 01/13/2020   HDL 38 (L) 01/13/2020   LDLCALC 114 (H) 01/13/2020   TRIG 129  01/13/2020   Lab Results  Component Value Date   WBC 8.3 08/24/2020   HGB 12.8 08/24/2020   HCT 38.0 08/24/2020   MCV 93 08/24/2020   PLT 327 08/24/2020   Lab Results  Component Value Date   IRON 84 01/13/2020   TIBC 353 01/13/2020   FERRITIN 82 01/13/2020   Attestation Statements:   Reviewed by clinician on day of visit: allergies, medications, problem list, medical history, surgical history, family history, social history, and previous encounter notes.  I, 01/15/2020, CMA, am acting as transcriptionist for Insurance claims handler, DO  I have reviewed the above documentation for accuracy and completeness, and I agree with the above. Helane Rima, DO

## 2020-10-04 DIAGNOSIS — Z800 Family history of malignant neoplasm of digestive organs: Secondary | ICD-10-CM | POA: Insufficient documentation

## 2020-10-05 ENCOUNTER — Encounter (INDEPENDENT_AMBULATORY_CARE_PROVIDER_SITE_OTHER): Payer: Self-pay | Admitting: Family Medicine

## 2020-10-08 ENCOUNTER — Encounter: Payer: Self-pay | Admitting: Family Medicine

## 2020-10-14 ENCOUNTER — Telehealth (INDEPENDENT_AMBULATORY_CARE_PROVIDER_SITE_OTHER): Payer: 59 | Admitting: Internal Medicine

## 2020-10-14 ENCOUNTER — Other Ambulatory Visit: Payer: Self-pay

## 2020-10-14 ENCOUNTER — Encounter: Payer: Self-pay | Admitting: Internal Medicine

## 2020-10-14 DIAGNOSIS — R21 Rash and other nonspecific skin eruption: Secondary | ICD-10-CM

## 2020-10-14 MED ORDER — PREDNISONE 20 MG PO TABS: 20.0000 mg | ORAL_TABLET | Freq: Every day | ORAL | 0 refills | Status: DC

## 2020-10-14 MED ORDER — NYSTATIN-TRIAMCINOLONE 100000-0.1 UNIT/GM-% EX OINT: 1.0000 "application " | TOPICAL_OINTMENT | Freq: Two times a day (BID) | CUTANEOUS | 0 refills | Status: DC

## 2020-10-14 NOTE — Patient Instructions (Signed)
Rash, Adult  A rash is a change in the color of your skin. A rash can also change the way your skin feels. There are many different conditions and factors that can cause a rash. Follow these instructions at home: The goal of treatment is to stop the itching and keep the rash from spreading. Watch for any changes in your symptoms. Let your doctor know about them. Follow these instructions to help with your condition: Medicine Take or apply over-the-counter and prescription medicines only as told by your doctor. These may include medicines:  To treat red or swollen skin (corticosteroid creams).  To treat itching.  To treat an allergy (oral antihistamines).  To treat very bad symptoms (oral corticosteroids).   Skin care  Put cool cloths (compresses) on the affected areas.  Do not scratch or rub your skin.  Avoid covering the rash. Make sure that the rash is exposed to air as much as possible. Managing itching and discomfort  Avoid hot showers or baths. These can make itching worse. A cold shower may help.  Try taking a bath with: ? Epsom salts. You can get these at your local pharmacy or grocery store. Follow the instructions on the package. ? Baking soda. Pour a small amount into the bath as told by your doctor. ? Colloidal oatmeal. You can get this at your local pharmacy or grocery store. Follow the instructions on the package.  Try putting baking soda paste onto your skin. Stir water into baking soda until it gets like a paste.  Try putting on a lotion that relieves itchiness (calamine lotion).  Keep cool and out of the sun. Sweating and being hot can make itching worse. General instructions  Rest as needed.  Drink enough fluid to keep your pee (urine) pale yellow.  Wear loose-fitting clothing.  Avoid scented soaps, detergents, and perfumes. Use gentle soaps, detergents, perfumes, and other cosmetic products.  Avoid anything that causes your rash. Keep a journal to help  track what causes your rash. Write down: ? What you eat. ? What cosmetic products you use. ? What you drink. ? What you wear. This includes jewelry.  Keep all follow-up visits as told by your doctor. This is important.   Contact a doctor if:  You sweat at night.  You lose weight.  You pee (urinate) more than normal.  You pee less than normal, or you notice that your pee is a darker color than normal.  You feel weak.  You throw up (vomit).  Your skin or the whites of your eyes look yellow (jaundice).  Your skin: ? Tingles. ? Is numb.  Your rash: ? Does not go away after a few days. ? Gets worse.  You are: ? More thirsty than normal. ? More tired than normal.  You have: ? New symptoms. ? Pain in your belly (abdomen). ? A fever. ? Watery poop (diarrhea). Get help right away if:  You have a fever and your symptoms suddenly get worse.  You start to feel mixed up (confused).  You have a very bad headache or a stiff neck.  You have very bad joint pains or stiffness.  You have jerky movements that you cannot control (seizure).  Your rash covers all or most of your body. The rash may or may not be painful.  You have blisters that: ? Are on top of the rash. ? Grow larger. ? Grow together. ? Are painful. ? Are inside your nose or mouth.  You have   a rash that: ? Looks like purple pinprick-sized spots all over your body. ? Has a "bull's eye" or looks like a target. ? Is red and painful, causes your skin to peel, and is not from being in the sun too long. Summary  A rash is a change in the color of your skin. A rash can also change the way your skin feels.  The goal of treatment is to stop the itching and keep the rash from spreading.  Take or apply over-the-counter and prescription medicines only as told by your doctor.  Contact a doctor if you have new symptoms or symptoms that get worse.  Keep all follow-up visits as told by your doctor. This is  important. This information is not intended to replace advice given to you by your health care provider. Make sure you discuss any questions you have with your health care provider. Document Revised: 10/11/2018 Document Reviewed: 01/21/2018 Elsevier Patient Education  2021 Elsevier Inc.  

## 2020-10-14 NOTE — Addendum Note (Signed)
Addended byTrena Platt on: 10/14/2020 12:05 PM   Modules accepted: Level of Service

## 2020-10-14 NOTE — Assessment & Plan Note (Signed)
Acute worsening of chronic rash Prednisone 20 mg QD Advised to take Omeprazole 40 mg QD while on Prednisone Continue Nystatin-Triamcinolone cream Referred to Dermatology.
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
## 2020-10-21 ENCOUNTER — Encounter (INDEPENDENT_AMBULATORY_CARE_PROVIDER_SITE_OTHER): Payer: Self-pay | Admitting: Physician Assistant

## 2020-10-21 ENCOUNTER — Telehealth (INDEPENDENT_AMBULATORY_CARE_PROVIDER_SITE_OTHER): Payer: 59 | Admitting: Physician Assistant

## 2020-10-21 ENCOUNTER — Other Ambulatory Visit: Payer: Self-pay

## 2020-10-21 DIAGNOSIS — E559 Vitamin D deficiency, unspecified: Secondary | ICD-10-CM | POA: Diagnosis not present

## 2020-10-21 DIAGNOSIS — E66813 Obesity, class 3: Secondary | ICD-10-CM

## 2020-10-21 DIAGNOSIS — Z6841 Body Mass Index (BMI) 40.0 and over, adult: Secondary | ICD-10-CM

## 2020-10-21 MED ORDER — VITAMIN D (ERGOCALCIFEROL) 1.25 MG (50000 UNIT) PO CAPS: 50000.0000 [IU] | ORAL_CAPSULE | ORAL | 0 refills | Status: DC

## 2020-10-25 NOTE — Progress Notes (Signed)
TeleHealth Visit:  Due to the COVID-19 pandemic, this visit was completed with telemedicine (audio/video) technology to reduce patient and provider exposure as well as to preserve personal protective equipment.   Jacqueline Collins has verbally consented to this TeleHealth visit. The patient is located at home, the provider is located at the [facility] office. The participants in this visit include the listed provider and patient. The visit was conducted today via video.   Chief Complaint: OBESITY Jacqueline Collins is here to discuss her progress with her obesity treatment plan along with follow-up of her obesity related diagnoses. Jacqueline Collins is on practicing portion control and making smarter food choices, such as increasing vegetables and decreasing simple carbohydrates and states she is following her eating plan approximately 0% of the time. Jacqueline Collins states she is walking 30-60 minutes 7 times per week.  Today's visit was #: 5 Starting weight: 391 lbs Starting date: 01/13/2020  Interim History: Jacqueline Collins reports that she has stomach pain and diarrhea today and so cannot eat or drink anything. She reports that she snacks a lot and only eats 1 meal a day.  Subjective:   1. Vitamin D deficiency Jacqueline Collins denies nausea, vomiting, and muscle weakness. Pt is on prescription Vit D.  Assessment/Plan:   1. Vitamin D deficiency Low Vitamin D level contributes to fatigue and are associated with obesity, breast, and colon cancer. She agrees to continue to take prescription Vitamin D @50 ,000 IU every week and will follow-up for routine testing of Vitamin D, at least 2-3 times per year to avoid over-replacement.  - Vitamin D, Ergocalciferol, (DRISDOL) 1.25 MG (50000 UNIT) CAPS capsule; Take 1 capsule (50,000 Units total) by mouth every 7 (seven) days.  Dispense: 4 capsule; Refill: 0  2. Class 3 severe obesity with serious comorbidity and body mass index (BMI) greater than or equal to 70 in adult, unspecified  obesity type (HCC) Jacqueline Collins is currently in the action stage of change. As such, her goal is to continue with weight loss efforts. She has agreed to practicing portion control and making smarter food choices, such as increasing vegetables and decreasing simple carbohydrates.   Exercise goals: As is  Behavioral modification strategies: increasing lean protein intake, decreasing simple carbohydrates and no skipping meals.  Jacqueline Collins has agreed to follow-up with our clinic in 3 weeks. She was informed of the importance of frequent follow-up visits to maximize her success with intensive lifestyle modifications for her multiple health conditions.  Objective:   VITALS: Per patient if applicable, see vitals. GENERAL: Alert and in no acute distress. CARDIOPULMONARY: No increased WOB. Speaking in clear sentences.  PSYCH: Pleasant and cooperative. Speech normal rate and rhythm. Affect is appropriate. Insight and judgement are appropriate. Attention is focused, linear, and appropriate.  NEURO: Oriented as arrived to appointment on time with no prompting.   Lab Results  Component Value Date   CREATININE 0.84 08/24/2020   BUN 7 08/24/2020   NA 138 08/24/2020   K 4.4 08/24/2020   CL 101 08/24/2020   CO2 22 08/24/2020   Lab Results  Component Value Date   ALT 9 08/24/2020   AST 10 08/24/2020   ALKPHOS 117 08/24/2020   BILITOT 0.4 08/24/2020   Lab Results  Component Value Date   HGBA1C 5.9 (A) 01/06/2020   HGBA1C 5.9 01/06/2020   HGBA1C 5.9 01/06/2020   HGBA1C 5.9 01/06/2020   No results found for: INSULIN Lab Results  Component Value Date   TSH 3.570 01/13/2020   Lab Results  Component Value Date   CHOL 175 01/13/2020   HDL 38 (L) 01/13/2020   LDLCALC 114 (H) 01/13/2020   TRIG 129 01/13/2020   Lab Results  Component Value Date   WBC 8.3 08/24/2020   HGB 12.8 08/24/2020   HCT 38.0 08/24/2020   MCV 93 08/24/2020   PLT 327 08/24/2020   Lab Results  Component Value Date   IRON  84 01/13/2020   TIBC 353 01/13/2020   FERRITIN 82 01/13/2020    Attestation Statements:   Reviewed by clinician on day of visit: allergies, medications, problem list, medical history, surgical history, family history, social history, and previous encounter notes.  Edmund Hilda, am acting as Energy manager for [employer], PA-C.  I have reviewed the above documentation for accuracy and completeness, and I agree with the above. Alois Cliche, PA-C

## 2020-11-08 ENCOUNTER — Other Ambulatory Visit: Payer: Self-pay | Admitting: Internal Medicine

## 2020-11-08 ENCOUNTER — Encounter (INDEPENDENT_AMBULATORY_CARE_PROVIDER_SITE_OTHER): Payer: Self-pay | Admitting: Family Medicine

## 2020-11-08 DIAGNOSIS — K5904 Chronic idiopathic constipation: Secondary | ICD-10-CM

## 2020-11-10 ENCOUNTER — Encounter: Payer: Self-pay | Admitting: Nurse Practitioner

## 2020-11-10 ENCOUNTER — Ambulatory Visit (INDEPENDENT_AMBULATORY_CARE_PROVIDER_SITE_OTHER): Payer: 59 | Admitting: Nurse Practitioner

## 2020-11-10 ENCOUNTER — Other Ambulatory Visit: Payer: Self-pay

## 2020-11-10 VITALS — BP 129/87 | HR 92 | Temp 98.4°F | Resp 18 | Ht 62.0 in | Wt 399.0 lb

## 2020-11-10 DIAGNOSIS — Z139 Encounter for screening, unspecified: Secondary | ICD-10-CM

## 2020-11-10 DIAGNOSIS — F418 Other specified anxiety disorders: Secondary | ICD-10-CM

## 2020-11-10 DIAGNOSIS — B370 Candidal stomatitis: Secondary | ICD-10-CM | POA: Diagnosis not present

## 2020-11-10 DIAGNOSIS — Z0001 Encounter for general adult medical examination with abnormal findings: Secondary | ICD-10-CM

## 2020-11-10 DIAGNOSIS — R21 Rash and other nonspecific skin eruption: Secondary | ICD-10-CM

## 2020-11-10 DIAGNOSIS — I10 Essential (primary) hypertension: Secondary | ICD-10-CM

## 2020-11-10 DIAGNOSIS — Z6841 Body Mass Index (BMI) 40.0 and over, adult: Secondary | ICD-10-CM

## 2020-11-10 MED ORDER — BUPROPION HCL ER (XL) 150 MG PO TB24: 150.0000 mg | ORAL_TABLET | Freq: Every day | ORAL | 0 refills | Status: DC

## 2020-11-10 MED ORDER — NYSTATIN 100000 UNIT/ML MT SUSP: 5.0000 mL | Freq: Four times a day (QID) | OROMUCOSAL | 0 refills | Status: DC

## 2020-11-10 MED ORDER — TRIAMCINOLONE ACETONIDE 0.1 % EX CREA: TOPICAL_CREAM | Freq: Every day | CUTANEOUS | 1 refills | Status: DC | PRN

## 2020-11-10 NOTE — Assessment & Plan Note (Signed)
-  well controlled today 

## 2020-11-10 NOTE — Assessment & Plan Note (Signed)
BMI Readings from Last 3 Encounters:  11/10/20 72.98 kg/m  09/30/20 73.34 kg/m  08/24/20 72.98 kg/m  -followed by medical weight management

## 2020-11-10 NOTE — Assessment & Plan Note (Addendum)
-  GAD-7 = 13 -PHQ-9 = 13 -Rx. wellbutrin -med check in 1 month -referred to psych

## 2020-11-10 NOTE — Telephone Encounter (Signed)
Can you please inform this patient of the fee's involved?

## 2020-11-10 NOTE — Assessment & Plan Note (Signed)
-  she has derm referral pending -she finished prednisone -she had nystatin/triamcinolone cream -Rx. Ketoconazole/triamcinolone cream

## 2020-11-10 NOTE — Patient Instructions (Signed)
Please have fasting labs drawn 2-3 days prior to your appointment so we can discuss the results during your office visit.  
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
## 2020-11-10 NOTE — Assessment & Plan Note (Signed)
Rx Magic mouthwash

## 2020-11-11 ENCOUNTER — Ambulatory Visit (INDEPENDENT_AMBULATORY_CARE_PROVIDER_SITE_OTHER): Payer: 59 | Admitting: Family Medicine

## 2020-11-11 ENCOUNTER — Telehealth: Payer: 59

## 2020-11-12 ENCOUNTER — Telehealth (INDEPENDENT_AMBULATORY_CARE_PROVIDER_SITE_OTHER): Payer: 59 | Admitting: Licensed Clinical Social Worker

## 2020-11-12 DIAGNOSIS — F418 Other specified anxiety disorders: Secondary | ICD-10-CM

## 2020-11-12 NOTE — BH Specialist Note (Signed)
Jacqueline Collins Initial Clinical Assessment  MRN: [id number] NAME: Jacqueline Collins Date: 11/12/20  Start time:  2p End time:  230p Total time:  30 min Call number:  video visit  Type of Contact:  video Patient consent obtained:  yes Reason for Visit today:  begin Jacqueline Collins services  Treatment History Patient recently received Inpatient Treatment:  no  Facility/Program:    Date of discharge:   Patient currently being seen by therapist/psychiatrist:   Patient currently receiving the following services:    Past Psychiatric History/Hospitalization(s): Anxiety: No Bipolar Disorder: No Depression: No Mania: No Psychosis: No Schizophrenia: No Personality Disorder: No Hospitalization for psychiatric illness: No History of Electroconvulsive Shock Therapy: No Prior Suicide Attempts: No  Clinical Assessment:  PHQ-9 Assessments: Depression screen [facility] 2/9 11/10/2020 10/14/2020 08/24/2020  Decreased Interest 0 0 2  Down, Depressed, Hopeless 3 0 2  PHQ - 2 Score 3 0 4  Altered sleeping 3 - 2  Tired, decreased energy 3 - 2  Change in appetite 0 - 2  Feeling bad or failure about yourself  2 - 0  Trouble concentrating 2 - 2  Moving slowly or fidgety/restless 0 - 0  Suicidal thoughts 0 - 0  PHQ-9 Score 13 - 12  Difficult doing work/chores Somewhat difficult - -  Some recent data might be hidden    GAD-7 Assessments: GAD 7 : Generalized Anxiety Score 11/10/2020 04/07/2020 12/23/2019 12/09/2019  Nervous, Anxious, on Edge 3 1 1  0  Control/stop worrying 1 2 1 1   Worry too much - different things 3 2 1  0  Trouble relaxing 1 3 3 3   Restless 1 3 3 3   Easily annoyed or irritable 3 3 3 3   Afraid - awful might happen 1 1 0 1  Total GAD 7 Score 13 15 12 11   Anxiety Difficulty Somewhat difficult Extremely difficult Very difficult Somewhat difficult     Social Functioning Social maturity:  WNL Social judgement:  WNL  Stress Current stressors:  relationship, poor mood, loss of  job Familial stressors:  none at this time Sleep:  poor Appetite:  poor Coping ability:  overwhelmed Patient taking medications as prescribed:  yes  Current medications:  Outpatient Encounter Medications as of 11/12/2020  Medication Sig  . albuterol (VENTOLIN HFA) 108 (90 Base) MCG/ACT inhaler Inhale 1-2 puffs into the lungs every 6 (six) hours as needed for wheezing or shortness of breath.  buPROPion (WELLBUTRIN XL) 150 MG 24 hr tablet Take 1 tablet (150 mg total) by mouth daily.  . CONSTULOSE 10 GM/15ML solution TAKE  15 ML BY MOUTH DAILY AS NEEDED FOR  MILD  OR  MODERATE  CONSTIPATION  . cyclobenzaprine (FLEXERIL) 10 MG tablet Take 1 tablet (10 mg total) by mouth 2 (two) times daily as needed for muscle spasms.  . Fremanezumab-vfrm (AJOVY) 225 MG/1.5ML SOAJ Inject 225 mg into the skin every 30 (thirty) days.  . furosemide (LASIX) 20 MG tablet TAKE 1 TABLET BY MOUTH ONCE DAILY AS NEEDED FOR EDEMA  . ketoconazole 2%-triamcinolone 0.1% 1:2 cream mixture Apply topically daily as needed for rash or itching.  . magic mouthwash (nystatin, hydrocortisone, diphenhydrAMINE) suspension Swish and swallow 5 mLs 4 (four) times daily.  omeprazole (PRILOSEC) 20 MG capsule Take 1 capsule (20 mg total) by mouth daily.  . propranolol (INDERAL) 20 MG tablet Take 1 tablet (20 mg total) by mouth 3 (three) times daily as needed.  . Vitamin D, Ergocalciferol, (DRISDOL) 1.25 MG (50000 UNIT) CAPS  capsule Take 1 capsule (50,000 Units total) by mouth every 7 (seven) days.   No facility-administered encounter medications on file as of 11/12/2020.    Self-harm Behaviors Risk Assessment Self-harm risk factors:   Patient endorses recent thoughts of harming self:    [place] Suicide Severity Rating Scale: No flowsheet data found.  Danger to Others Risk Assessment Danger to others risk factors:   Patient endorses recent thoughts of harming others:    Dynamic Appraisal of Situational Aggression (DASA): No  flowsheet data found.  Substance Use Assessment Patient recently consumed alcohol:    Alcohol Use Disorder Identification Test (AUDIT):  Alcohol Use Disorder Test (AUDIT) 10/23/2019  1. How often do you have a drink containing alcohol? 0  2. How many drinks containing alcohol do you have on a typical day when you are drinking? 0  3. How often do you have six or more drinks on one occasion? 0  AUDIT-C Score 0   Patient recently used drugs:    Opioid Risk Assessment:  Patient is concerned about dependence or abuse of substances:    ASAM Multidimensional Assessment Summary:  Dimension 1:    Dimension 1 Rating:    Dimension 2:    Dimension 2 Rating:    Dimension 3:    Dimension 3 Rating:    Dimension 4:    Dimension 4 Rating:    Dimension 5:    Dimension 5 Rating:    Dimension 6:    Dimension 6 Rating:   ASAM's Severity Rating Score:   ASAM Recommended Level of Treatment:     Goals, Interventions and Follow-up Plan Goals: Increase healthy adjustment to current life circumstances Interventions: Motivational Interviewing and CBT Cognitive Behavioral Therapy Follow-up Plan: weekly VBH sessions  Summary of Clinical Assessment Summary: [name] is a 35 yr old woman that was referred by her PCP.  She has not worked in the past year and is having difficulty with the transition.  She reports that she has had a few deaths in his family that has increased her sadness. [name] requesting therapy to assist with her depression and weight loss.    Jacqueline Elk, LCSW

## 2020-11-15 ENCOUNTER — Other Ambulatory Visit: Payer: Self-pay

## 2020-11-15 ENCOUNTER — Telehealth (INDEPENDENT_AMBULATORY_CARE_PROVIDER_SITE_OTHER): Payer: 59 | Admitting: Family Medicine

## 2020-11-15 ENCOUNTER — Encounter (INDEPENDENT_AMBULATORY_CARE_PROVIDER_SITE_OTHER): Payer: Self-pay | Admitting: Family Medicine

## 2020-11-15 DIAGNOSIS — E559 Vitamin D deficiency, unspecified: Secondary | ICD-10-CM

## 2020-11-15 DIAGNOSIS — F4329 Adjustment disorder with other symptoms: Secondary | ICD-10-CM

## 2020-11-15 DIAGNOSIS — Z6841 Body Mass Index (BMI) 40.0 and over, adult: Secondary | ICD-10-CM

## 2020-11-15 DIAGNOSIS — Z596 Low income: Secondary | ICD-10-CM

## 2020-11-15 MED ORDER — VITAMIN D (ERGOCALCIFEROL) 1.25 MG (50000 UNIT) PO CAPS: 50000.0000 [IU] | ORAL_CAPSULE | ORAL | 0 refills | Status: DC

## 2020-11-15 MED ORDER — PROPRANOLOL HCL 20 MG PO TABS: 20.0000 mg | ORAL_TABLET | Freq: Three times a day (TID) | ORAL | 0 refills | Status: DC | PRN

## 2020-11-17 ENCOUNTER — Telehealth (INDEPENDENT_AMBULATORY_CARE_PROVIDER_SITE_OTHER): Payer: 59 | Admitting: Licensed Clinical Social Worker

## 2020-11-17 DIAGNOSIS — F418 Other specified anxiety disorders: Secondary | ICD-10-CM

## 2020-11-17 NOTE — BH Specialist Note (Deleted)
[place] [facility] Initial Clinical Assessment  MRN: [id number] NAME: ATIA HAUPT Date: 11/17/20  Start time:   End time:   Total time:   Call number:    Type of Contact:   Patient consent obtained:   Reason for Visit today:    Treatment History Patient recently received Inpatient Treatment:    Facility/Program:    Date of discharge:   Patient currently being seen by therapist/psychiatrist:   Patient currently receiving the following services:    Past Psychiatric History/Hospitalization(s): Anxiety: {BHH YES OR NO:22294} Bipolar Disorder: {BHH YES OR NO:22294} Depression: {BHH YES OR NO:22294} Mania: {BHH YES OR NO:22294} Psychosis: {BHH YES OR NO:22294} Schizophrenia: {BHH YES OR NO:22294} Personality Disorder: {BHH YES OR NO:22294} Hospitalization for psychiatric illness: {BHH YES OR NO:22294} History of Electroconvulsive Shock Therapy: {BHH YES OR NO:22294} Prior Suicide Attempts: {BHH YES OR NO:22294}  Clinical Assessment:  PHQ-9 Assessments: Depression screen [facility] 2/9 11/10/2020 10/14/2020 08/24/2020  Decreased Interest 0 0 2  Down, Depressed, Hopeless 3 0 2  PHQ - 2 Score 3 0 4  Altered sleeping 3 - 2  Tired, decreased energy 3 - 2  Change in appetite 0 - 2  Feeling bad or failure about yourself  2 - 0  Trouble concentrating 2 - 2  Moving slowly or fidgety/restless 0 - 0  Suicidal thoughts 0 - 0  PHQ-9 Score 13 - 12  Difficult doing work/chores Somewhat difficult - -  Some recent data might be hidden    GAD-7 Assessments: GAD 7 : Generalized Anxiety Score 11/10/2020 04/07/2020 12/23/2019 12/09/2019  Nervous, Anxious, on Edge 3 1 1  0  Control/stop worrying 1 2 1 1   Worry too much - different things 3 2 1  0  Trouble relaxing 1 3 3 3   Restless 1 3 3 3   Easily annoyed or irritable 3 3 3 3   Afraid - awful might happen 1 1 0 1  Total GAD 7 Score 13 15 12 11   Anxiety Difficulty Somewhat difficult Extremely difficult Very difficult Somewhat difficult      Social Functioning Social maturity:   Social judgement:    Stress Current stressors:   Familial stressors:   Sleep:   Appetite:   Coping ability:   Patient taking medications as prescribed:    Current medications:  Outpatient Encounter Medications as of 11/17/2020  Medication Sig  . albuterol (VENTOLIN HFA) 108 (90 Base) MCG/ACT inhaler Inhale 1-2 puffs into the lungs every 6 (six) hours as needed for wheezing or shortness of breath.  buPROPion (WELLBUTRIN XL) 150 MG 24 hr tablet Take 1 tablet (150 mg total) by mouth daily.  . CONSTULOSE 10 GM/15ML solution TAKE  15 ML BY MOUTH DAILY AS NEEDED FOR  MILD  OR  MODERATE  CONSTIPATION  . cyclobenzaprine (FLEXERIL) 10 MG tablet Take 1 tablet (10 mg total) by mouth 2 (two) times daily as needed for muscle spasms.  . Fremanezumab-vfrm (AJOVY) 225 MG/1.5ML SOAJ Inject 225 mg into the skin every 30 (thirty) days.  . furosemide (LASIX) 20 MG tablet TAKE 1 TABLET BY MOUTH ONCE DAILY AS NEEDED FOR EDEMA  . ketoconazole 2%-triamcinolone 0.1% 1:2 cream mixture Apply topically daily as needed for rash or itching.  . magic mouthwash (nystatin, hydrocortisone, diphenhydrAMINE) suspension Swish and swallow 5 mLs 4 (four) times daily.  omeprazole (PRILOSEC) 20 MG capsule Take 1 capsule (20 mg total) by mouth daily.  . propranolol (INDERAL) 20 MG tablet Take 1 tablet (20 mg total) by  mouth 3 (three) times daily as needed.  . Vitamin D, Ergocalciferol, (DRISDOL) 1.25 MG (50000 UNIT) CAPS capsule Take 1 capsule (50,000 Units total) by mouth every 7 (seven) days.   No facility-administered encounter medications on file as of 11/17/2020.    Self-harm Behaviors Risk Assessment Self-harm risk factors:   Patient endorses recent thoughts of harming self:    [place] Suicide Severity Rating Scale: No flowsheet data found.  Danger to Others Risk Assessment Danger to others risk factors:   Patient endorses recent thoughts of harming others:     Dynamic Appraisal of Situational Aggression (DASA): No flowsheet data found.  Substance Use Assessment Patient recently consumed alcohol:    Alcohol Use Disorder Identification Test (AUDIT):  Alcohol Use Disorder Test (AUDIT) 10/23/2019  1. How often do you have a drink containing alcohol? 0  2. How many drinks containing alcohol do you have on a typical day when you are drinking? 0  3. How often do you have six or more drinks on one occasion? 0  AUDIT-C Score 0   Patient recently used drugs:    Opioid Risk Assessment:  Patient is concerned about dependence or abuse of substances:    ASAM Multidimensional Assessment Summary:  Dimension 1:    Dimension 1 Rating:    Dimension 2:    Dimension 2 Rating:    Dimension 3:    Dimension 3 Rating:    Dimension 4:    Dimension 4 Rating:    Dimension 5:    Dimension 5 Rating:    Dimension 6:    Dimension 6 Rating:   ASAM's Severity Rating Score:   ASAM Recommended Level of Treatment:     Goals, Interventions and Follow-up Plan Goals: {IBH Goals:21014053} Interventions: {IBH Interventions:21014054} Follow-up Plan: {Virtual BH Follow up Recommendations:21014064}  Summary of Clinical Assessment Summary: ***   Marinda Elk, LCSW

## 2020-11-17 NOTE — Progress Notes (Signed)
[facility] Psychiatric Consultant Case Review   Jacqueline Collins is a 35 y.o. year old female with a history of depression, migraine, obesity, OSA.  Worsening in depression in the setting of grief of loss of her family members, financial strain/unemployment for the past several months.  She also has some strain in relationship.     Assessment/Provisional Diagnosis # MDD Will verify her medication given it is unclear whether she takes both Lexapro and bupropion.  We plan to adjust medication if needed.    Recommendation - BH specialist to review her medication. Per chart, she is on lexapro 20 mg daily, bupropion 150 mg daily - BH specialist to offer weekly CBT   Thank you for your consult. We will continue to follow the patient. Please contact [facility]  for any questions or concerns.   The above treatment considerations and suggestions are based on consultation with the [facility] specialist and/or PCP and a review of information available in the shared registry and the patient's Electronic Health Record (EHR). I have not personally examined the patient. All recommendations should be implemented with consideration of the patient's relevant prior history and current clinical status. Please feel free to call me with any questions about the care of this patient.
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
## 2020-11-18 ENCOUNTER — Encounter: Payer: Self-pay | Admitting: Family Medicine

## 2020-11-18 ENCOUNTER — Telehealth: Payer: 59

## 2020-11-18 ENCOUNTER — Other Ambulatory Visit: Payer: Self-pay

## 2020-11-18 NOTE — Telephone Encounter (Signed)
She has been referred to dermatology in the past can we see what happened to this referral?

## 2020-11-22 NOTE — Progress Notes (Signed)
TeleHealth Visit:  Due to the COVID-19 pandemic, this visit was completed with telemedicine (audio/video) technology to reduce patient and provider exposure as well as to preserve personal protective equipment.   Jacqueline Collins has verbally consented to this TeleHealth visit. The patient is located at home, the provider is located at the [facility] office. The participants in this visit include the listed provider and patient. The visit was conducted today via MyChart video.  OBESITY Jacqueline Collins is here to discuss her progress with her obesity treatment plan along with follow-up of her obesity related diagnoses.   Today's visit was #: 6 Starting weight: 391 lbs Starting date: 01/13/2020 Today's date: 11/15/2020  Interim History: Jacqueline Collins's PCP visit was reviewed.  She met with an LCSW from [facility].  She says she has information for [name] financial aid, but has not filled it out yet.  She has started the SSI process.  Her wife is currently sick with possible COVID.  Nutrition Plan: practicing portion control and making smarter food choices, such as increasing vegetables and decreasing simple carbohydrates for 80-90% of the time.  Activity: Walking/stretching for 30 minutes 3-4 times per week.  Assessment/Plan:   Orders Placed This Encounter  Procedures  . AMB Referral to [facility]   Meds ordered this encounter  Medications  . Vitamin D, Ergocalciferol, (DRISDOL) 1.25 MG (50000 UNIT) CAPS capsule    Sig: Take 1 capsule (50,000 Units total) by mouth every 7 (seven) days.    Dispense:  4 capsule    Refill:  0  . propranolol (INDERAL) 20 MG tablet    Sig: Take 1 tablet (20 mg total) by mouth 3 (three) times daily as needed.    Dispense:  20 tablet    Refill:  0     1. Patient cannot afford medications Will place referral to [facility] today for help with medications.  - AMB Referral to [facility]  2. Vitamin D deficiency Not at goal. Current vitamin D  is 14.2, tested on 01/13/2020. Optimal goal > 50 ng/dL.  She is taking vitamin D 50,000 IU weekly.  Plan: Continue to take prescription Vitamin D @50 ,000 IU every week as prescribed.  Follow-up for routine testing of Vitamin D, at least 2-3 times per year to avoid over-replacement.  - Refill Vitamin D, Ergocalciferol, (DRISDOL) 1.25 MG (50000 UNIT) CAPS capsule; Take 1 capsule (50,000 Units total) by mouth every 7 (seven) days.  Dispense: 4 capsule; Refill: 0  3. Adjustment disorder with other symptom Jacqueline Collins is taking propranolol 20 mg three times daily as needed.  Plan:  Continue therapy and propranolol as needed.  We will continue to monitor as it relates to her weight loss journey.  - Refill propranolol (INDERAL) 20 MG tablet; Take 1 tablet (20 mg total) by mouth 3 (three) times daily as needed.  Dispense: 20 tablet; Refill: 0  4. Obesity, current BMI 73.34  Jacqueline Collins is currently in the action stage of change. As such, her goal is to continue with weight loss efforts. She has agreed to practicing portion control and making smarter food choices, such as increasing vegetables and decreasing simple carbohydrates.   Exercise goals: As is.  Behavioral modification strategies: increasing lean protein intake, decreasing simple carbohydrates, increasing vegetables, increasing water intake and decreasing liquid calories.  Jacqueline Collins has agreed to follow-up with our clinic in 3-4 weeks. She was informed of the importance of frequent follow-up visits to maximize her success with intensive lifestyle modifications for  her multiple health conditions.  Objective:   VITALS: Per patient if applicable, see vitals. GENERAL: Alert and in no acute distress. CARDIOPULMONARY: No increased WOB. Speaking in clear sentences.  PSYCH: Pleasant and cooperative. Speech normal rate and rhythm. Affect is appropriate. Insight and judgement are appropriate. Attention is focused, linear, and appropriate.  NEURO: Oriented as  arrived to appointment on time with no prompting.   Lab Results  Component Value Date   CREATININE 0.84 08/24/2020   BUN 7 08/24/2020   NA 138 08/24/2020   K 4.4 08/24/2020   CL 101 08/24/2020   CO2 22 08/24/2020   Lab Results  Component Value Date   ALT 9 08/24/2020   AST 10 08/24/2020   ALKPHOS 117 08/24/2020   BILITOT 0.4 08/24/2020   Lab Results  Component Value Date   HGBA1C 5.9 (A) 01/06/2020   HGBA1C 5.9 01/06/2020   HGBA1C 5.9 01/06/2020   HGBA1C 5.9 01/06/2020   Lab Results  Component Value Date   TSH 3.570 01/13/2020   Lab Results  Component Value Date   CHOL 175 01/13/2020   HDL 38 (L) 01/13/2020   LDLCALC 114 (H) 01/13/2020   TRIG 129 01/13/2020   Lab Results  Component Value Date   WBC 8.3 08/24/2020   HGB 12.8 08/24/2020   HCT 38.0 08/24/2020   MCV 93 08/24/2020   PLT 327 08/24/2020   Lab Results  Component Value Date   IRON 84 01/13/2020   TIBC 353 01/13/2020   FERRITIN 82 01/13/2020   Attestation Statements:   Reviewed by clinician on day of visit: allergies, medications, problem list, medical history, surgical history, family history, social history, and previous encounter notes.  I, Water quality scientist, CMA, am acting as transcriptionist for Briscoe Deutscher, DO  I have reviewed the above documentation for accuracy and completeness, and I agree with the above. Briscoe Deutscher, DO

## 2020-11-24 ENCOUNTER — Telehealth: Payer: Self-pay | Admitting: Licensed Clinical Social Worker

## 2020-11-24 DIAGNOSIS — F418 Other specified anxiety disorders: Secondary | ICD-10-CM

## 2020-11-24 NOTE — BH Specialist Note (Signed)
[facility] Virtual BH Telephone Follow-up  MRN: [id number] NAME: Jacqueline Collins Date: 11/24/20  Start time:  2p End time:  230 Total time:  30 min Call number:  video visit  Reason for call today:  follow up  PHQ-9 Scores:  Depression screen [facility] 2/9 11/24/2020 11/10/2020 10/14/2020 08/24/2020 08/12/2020  Decreased Interest 2 0 0 2 3  Down, Depressed, Hopeless 2 3 0 2 3  PHQ - 2 Score 4 3 0 4 6  Altered sleeping 2 3 - 2 0  Tired, decreased energy 2 3 - 2 0  Change in appetite 2 0 - 2 0  Feeling bad or failure about yourself  2 2 - 0 3  Trouble concentrating 1 2 - 2 0  Moving slowly or fidgety/restless 0 0 - 0 1  Suicidal thoughts 0 0 - 0 0  PHQ-9 Score 13 13 - 12 10  Difficult doing work/chores Somewhat difficult Somewhat difficult - - Somewhat difficult  Some recent data might be hidden   GAD-7 Scores:  GAD 7 : Generalized Anxiety Score 11/24/2020 11/10/2020 04/07/2020 12/23/2019  Nervous, Anxious, on Edge 2 3 1 1   Control/stop worrying 2 1 2 1   Worry too much - different things 2 3 2 1   Trouble relaxing 2 1 3 3   Restless 1 1 3 3   Easily annoyed or irritable 2 3 3 3   Afraid - awful might happen 1 1 1  0  Total GAD 7 Score 12 13 15 12   Anxiety Difficulty Somewhat difficult Somewhat difficult Extremely difficult Very difficult    Stress Current stressors:  finances, bereavement, relationship Sleep:  fair Appetite:  fair Coping ability:  overwhelmed Patient taking medications as prescribed:  yes  Current medications:  Outpatient Encounter Medications as of 11/24/2020  Medication Sig  . albuterol (VENTOLIN HFA) 108 (90 Base) MCG/ACT inhaler Inhale 1-2 puffs into the lungs every 6 (six) hours as needed for wheezing or shortness of breath.  buPROPion (WELLBUTRIN XL) 150 MG 24 hr tablet Take 1 tablet (150 mg total) by mouth daily.  . CONSTULOSE 10 GM/15ML solution TAKE  15 ML BY MOUTH DAILY AS NEEDED FOR  MILD  OR  MODERATE  CONSTIPATION  . cyclobenzaprine (FLEXERIL) 10 MG tablet  Take 1 tablet (10 mg total) by mouth 2 (two) times daily as needed for muscle spasms.  . Fremanezumab-vfrm (AJOVY) 225 MG/1.5ML SOAJ Inject 225 mg into the skin every 30 (thirty) days.  . furosemide (LASIX) 20 MG tablet TAKE 1 TABLET BY MOUTH ONCE DAILY AS NEEDED FOR EDEMA  . ketoconazole 2%-triamcinolone 0.1% 1:2 cream mixture Apply topically daily as needed for rash or itching.  . magic mouthwash (nystatin, hydrocortisone, diphenhydrAMINE) suspension Swish and swallow 5 mLs 4 (four) times daily.  omeprazole (PRILOSEC) 20 MG capsule Take 1 capsule (20 mg total) by mouth daily.  . propranolol (INDERAL) 20 MG tablet Take 1 tablet (20 mg total) by mouth 3 (three) times daily as needed.  . Vitamin D, Ergocalciferol, (DRISDOL) 1.25 MG (50000 UNIT) CAPS capsule Take 1 capsule (50,000 Units total) by mouth every 7 (seven) days.   No facility-administered encounter medications on file as of 11/24/2020.     Self-harm Behaviors Risk Assessment Self-harm risk factors:   Patient endorses recent thoughts of harming self:    Suicide Severity Rating Scale: No flowsheet data found. No flowsheet data found.   Danger to Others Risk Assessment Danger to others risk factors:   Patient endorses recent thoughts of  harming others:    Dynamic Appraisal of Situational Aggression (DASA): No flowsheet data found.   Substance Use Assessment Patient recently consumed alcohol:    Alcohol Use Disorder Identification Test (AUDIT):  Alcohol Use Disorder Test (AUDIT) 10/23/2019  1. How often do you have a drink containing alcohol? 0  2. How many drinks containing alcohol do you have on a typical day when you are drinking? 0  3. How often do you have six or more drinks on one occasion? 0  AUDIT-C Score 0   Patient recently used drugs:    Opioid Risk Assessment:    Goals, Interventions and Follow-up Plan Goals: Increase healthy adjustment to current life circumstances Interventions: Solution-Focused  Strategies, Mindfulness or Relaxation Training and Behavioral Activation Follow-up Plan: weekly VBH follow up  Summary: Jacqueline Collins is a 35 yr old woman referred by her PCP.  Terasa continues to have difficulty in her relationship, lack of employment, family concerns and bereavement.  She has a desire to work on herself but lacks motivation and is easily distracted by small things.  She was provided with homework of taking 10-15 minutes per day to search for employment and 5-10 minutes to walk or do some type of exercise.  She wants to improve her relationship so homework was given for her to writ down how she wants the relationship to change. Marinda Elk, LCSW

## 2020-11-25 ENCOUNTER — Telehealth: Payer: 59

## 2020-11-25 ENCOUNTER — Other Ambulatory Visit: Payer: Self-pay

## 2020-12-01 ENCOUNTER — Telehealth: Payer: Self-pay | Admitting: Licensed Clinical Social Worker

## 2020-12-01 NOTE — Telephone Encounter (Signed)
On Thursday Nov 25 2020; Patient requested a reschedule of appt.

## 2020-12-03 NOTE — Telephone Encounter (Signed)
This is booked for 6/21 1pm With [facility] [place] [facility]

## 2020-12-10 ENCOUNTER — Ambulatory Visit: Payer: 59 | Admitting: Internal Medicine

## 2020-12-15 ENCOUNTER — Ambulatory Visit (INDEPENDENT_AMBULATORY_CARE_PROVIDER_SITE_OTHER): Payer: 59 | Admitting: Family Medicine

## 2020-12-15 ENCOUNTER — Encounter (INDEPENDENT_AMBULATORY_CARE_PROVIDER_SITE_OTHER): Payer: Self-pay

## 2020-12-28 ENCOUNTER — Other Ambulatory Visit: Payer: Self-pay | Admitting: Nurse Practitioner

## 2020-12-30 ENCOUNTER — Telehealth: Payer: 59 | Admitting: Physician Assistant

## 2020-12-30 DIAGNOSIS — R059 Cough, unspecified: Secondary | ICD-10-CM | POA: Diagnosis not present

## 2020-12-30 DIAGNOSIS — B9689 Other specified bacterial agents as the cause of diseases classified elsewhere: Secondary | ICD-10-CM

## 2020-12-30 DIAGNOSIS — J019 Acute sinusitis, unspecified: Secondary | ICD-10-CM

## 2020-12-30 MED ORDER — BENZONATATE 100 MG PO CAPS: 100.0000 mg | ORAL_CAPSULE | Freq: Three times a day (TID) | ORAL | 0 refills | Status: DC | PRN

## 2020-12-30 MED ORDER — AMOXICILLIN-POT CLAVULANATE 875-125 MG PO TABS: 1.0000 | ORAL_TABLET | Freq: Two times a day (BID) | ORAL | 0 refills | Status: DC

## 2020-12-30 NOTE — Progress Notes (Signed)
E-Visit for Sinus Problems  We are sorry that you are not feeling well.  Here is how we plan to help!  Based on what you have shared with me it looks like you have sinusitis.  Sinusitis is inflammation and infection in the sinus cavities of the head.  Based on your presentation I believe you most likely have Acute Bacterial Sinusitis.  This is an infection caused by bacteria and is treated with antibiotics. I have prescribed Augmentin 875mg /125mg  one tablet twice daily with food, for 7 days. I will also prescribe Tessalon perles 100mg  one tablet every 8 hours as needed for cough. You may use an oral decongestant such as Mucinex D or if you have glaucoma or high blood pressure use plain Mucinex. Saline nasal spray help and can safely be used as often as needed for congestion.  If you develop worsening sinus pain, fever or notice severe headache and vision changes, or if symptoms are not better after completion of antibiotic, please schedule an appointment with a health care provider.    Sinus infections are not as easily transmitted as other respiratory infection, however we still recommend that you avoid close contact with loved ones, especially the very young and elderly.  Remember to wash your hands thoroughly throughout the day as this is the number one way to prevent the spread of infection!  Home Care: Only take medications as instructed by your medical team. Complete the entire course of an antibiotic. Do not take these medications with alcohol. A steam or ultrasonic humidifier can help congestion.  You can place a towel over your head and breathe in the steam from hot water coming from a faucet. Avoid close contacts especially the very young and the elderly. Cover your mouth when you cough or sneeze. Always remember to wash your hands.  Get Help Right Away If: You develop worsening fever or sinus pain. You develop a severe head ache or visual changes. Your symptoms persist after you have  completed your treatment plan.  Make sure you Understand these instructions. Will watch your condition. Will get help right away if you are not doing well or get worse.  Thank you for choosing an e-visit.  Your e-visit answers were reviewed by a board certified advanced clinical practitioner to complete your personal care plan. Depending upon the condition, your plan could have included both over the counter or prescription medications.  Please review your pharmacy choice. Make sure the pharmacy is open so you can pick up prescription now. If there is a problem, you may contact your provider through and have the prescription routed to another pharmacy.  Your safety is important to . If you have drug allergies check your prescription carefully.   For the next 24 hours you can use MyChart to ask questions about today's visit, request a non-urgent call back, or ask for a work or school excuse. You will get an email in the next two days asking about your experience. I hope that your e-visit has been valuable and will speed your recovery.  I provided 5 minutes of non face-to-face time during this encounter for chart review and documentation.

## 2021-01-12 ENCOUNTER — Other Ambulatory Visit: Payer: Self-pay | Admitting: Nurse Practitioner

## 2021-01-12 ENCOUNTER — Other Ambulatory Visit (INDEPENDENT_AMBULATORY_CARE_PROVIDER_SITE_OTHER): Payer: Self-pay | Admitting: Family Medicine

## 2021-01-12 DIAGNOSIS — E559 Vitamin D deficiency, unspecified: Secondary | ICD-10-CM

## 2021-01-12 DIAGNOSIS — F4329 Adjustment disorder with other symptoms: Secondary | ICD-10-CM

## 2021-01-12 DIAGNOSIS — F418 Other specified anxiety disorders: Secondary | ICD-10-CM

## 2021-01-13 ENCOUNTER — Other Ambulatory Visit (INDEPENDENT_AMBULATORY_CARE_PROVIDER_SITE_OTHER): Payer: Self-pay | Admitting: Family Medicine

## 2021-01-13 ENCOUNTER — Other Ambulatory Visit: Payer: Self-pay | Admitting: Neurology

## 2021-01-13 ENCOUNTER — Other Ambulatory Visit: Payer: Self-pay | Admitting: Internal Medicine

## 2021-01-13 ENCOUNTER — Other Ambulatory Visit: Payer: Self-pay | Admitting: Nurse Practitioner

## 2021-01-13 ENCOUNTER — Other Ambulatory Visit: Payer: Self-pay

## 2021-01-13 ENCOUNTER — Other Ambulatory Visit: Payer: Self-pay | Admitting: Physician Assistant

## 2021-01-13 DIAGNOSIS — K5904 Chronic idiopathic constipation: Secondary | ICD-10-CM

## 2021-01-13 DIAGNOSIS — F418 Other specified anxiety disorders: Secondary | ICD-10-CM

## 2021-01-13 DIAGNOSIS — R059 Cough, unspecified: Secondary | ICD-10-CM

## 2021-01-13 DIAGNOSIS — G43709 Chronic migraine without aura, not intractable, without status migrainosus: Secondary | ICD-10-CM

## 2021-01-13 DIAGNOSIS — F4329 Adjustment disorder with other symptoms: Secondary | ICD-10-CM

## 2021-01-13 DIAGNOSIS — R600 Localized edema: Secondary | ICD-10-CM

## 2021-01-13 DIAGNOSIS — E559 Vitamin D deficiency, unspecified: Secondary | ICD-10-CM

## 2021-01-13 DIAGNOSIS — B370 Candidal stomatitis: Secondary | ICD-10-CM

## 2021-01-13 MED ORDER — LACTULOSE 10 GM/15ML PO SOLN: ORAL | 0 refills | Status: DC

## 2021-01-13 MED ORDER — PROPRANOLOL HCL 20 MG PO TABS: 20.0000 mg | ORAL_TABLET | Freq: Three times a day (TID) | ORAL | 0 refills | Status: DC | PRN

## 2021-01-13 MED ORDER — VITAMIN D (ERGOCALCIFEROL) 1.25 MG (50000 UNIT) PO CAPS: 50000.0000 [IU] | ORAL_CAPSULE | ORAL | 0 refills | Status: DC

## 2021-01-13 MED ORDER — BUPROPION HCL ER (XL) 150 MG PO TB24: 150.0000 mg | ORAL_TABLET | Freq: Every day | ORAL | 0 refills | Status: DC

## 2021-01-13 MED ORDER — NYSTATIN 100000 UNIT/ML MT SUSP: 5.0000 mL | Freq: Four times a day (QID) | OROMUCOSAL | 0 refills | Status: DC

## 2021-01-13 MED ORDER — FUROSEMIDE 20 MG PO TABS: 20.0000 mg | ORAL_TABLET | Freq: Every day | ORAL | 0 refills | Status: DC

## 2021-01-13 NOTE — Telephone Encounter (Signed)
I have not seen this patient, or prescribed this medication in 7 months.  If she is still having issues, she should probably schedule a follow up appointment.

## 2021-01-13 NOTE — Telephone Encounter (Signed)
Last OV with Dr Wallace 

## 2021-01-13 NOTE — Telephone Encounter (Signed)
Received a notification from [employer] they have attempted multiple times to contact pt with no repsonse.  "Voiding order because no response from patient since 08/11/2020."

## 2021-01-19 ENCOUNTER — Ambulatory Visit (INDEPENDENT_AMBULATORY_CARE_PROVIDER_SITE_OTHER): Payer: 59 | Admitting: Family Medicine

## 2021-01-19 ENCOUNTER — Other Ambulatory Visit: Payer: Self-pay

## 2021-01-19 ENCOUNTER — Encounter (INDEPENDENT_AMBULATORY_CARE_PROVIDER_SITE_OTHER): Payer: Self-pay | Admitting: Family Medicine

## 2021-01-19 VITALS — BP 110/76 | HR 87 | Temp 98.0°F | Ht 62.0 in | Wt 395.0 lb

## 2021-01-19 DIAGNOSIS — R600 Localized edema: Secondary | ICD-10-CM | POA: Diagnosis not present

## 2021-01-19 DIAGNOSIS — I10 Essential (primary) hypertension: Secondary | ICD-10-CM

## 2021-01-19 DIAGNOSIS — Z9189 Other specified personal risk factors, not elsewhere classified: Secondary | ICD-10-CM

## 2021-01-19 DIAGNOSIS — Z6841 Body Mass Index (BMI) 40.0 and over, adult: Secondary | ICD-10-CM

## 2021-01-19 DIAGNOSIS — R7303 Prediabetes: Secondary | ICD-10-CM

## 2021-01-19 DIAGNOSIS — K5904 Chronic idiopathic constipation: Secondary | ICD-10-CM

## 2021-01-19 DIAGNOSIS — F418 Other specified anxiety disorders: Secondary | ICD-10-CM

## 2021-01-21 ENCOUNTER — Encounter (INDEPENDENT_AMBULATORY_CARE_PROVIDER_SITE_OTHER): Payer: Self-pay | Admitting: Family Medicine

## 2021-01-24 ENCOUNTER — Other Ambulatory Visit: Payer: Self-pay

## 2021-01-24 ENCOUNTER — Telehealth (INDEPENDENT_AMBULATORY_CARE_PROVIDER_SITE_OTHER): Payer: 59 | Admitting: Family Medicine

## 2021-01-24 DIAGNOSIS — R059 Cough, unspecified: Secondary | ICD-10-CM

## 2021-01-24 DIAGNOSIS — J329 Chronic sinusitis, unspecified: Secondary | ICD-10-CM | POA: Diagnosis not present

## 2021-01-24 MED ORDER — CHLORPHENIRAMINE MALEATE 4 MG PO TABS: ORAL_TABLET | ORAL | 0 refills | Status: DC

## 2021-01-24 MED ORDER — MONTELUKAST SODIUM 10 MG PO TABS: 10.0000 mg | ORAL_TABLET | Freq: Every day | ORAL | 3 refills | Status: DC

## 2021-01-24 NOTE — Telephone Encounter (Signed)
Last Ov with Dr Wallace 

## 2021-01-24 NOTE — Patient Instructions (Signed)
F/U with Dr Allena Katz in 2 to 4 weeks, call if you need to be seen sooner  New medication for allergy is singulair every day  and continue claritin as you are taking it  A decongestant is prescribed for once daily use as needed, after the initial 3 to 5 days , you likely will not need this  Please submit sputum for testing for bacterial infection ( C/S to be ordered)  Thanks for choosing [facility], we consider it a privelige to serve you.

## 2021-01-24 NOTE — Progress Notes (Signed)
Virtual Visit via Telephone Note  I connected with [name] on 01/24/21 at 11:00 AM EDT by telephone and verified that I am speaking with the correct person using two identifiers.  Location: Patient: home Provider: office   I discussed the limitations, risks, security and privacy concerns of performing an evaluation and management service by telephone and the availability of in person appointments. I also discussed with the patient that there may be a patient responsible charge related to this service. The patient expressed understanding and agreed to proceed.   History of Present Illness: 2 day h/o sinus congestion, sore throat and cough  Still coughing green sputum, ears still pop,just completed amoxicillin for 2 weeks, still has drainage , sOB, sore throat, she has asthma, denies fever or chills    Observations/Objective: There were no vitals taken for this visit. Good communication with no confusion and intact memory. Alert and oriented x 3 No signs of respiratory distress during speech   Assessment and Plan: Sinusitis Persistent pressure and drainage despite antibiotic course, no fevr or chills, add singulair daily and chlorpheniramine for short course Submit specimen for c/s  Cough Chronic cough in asthmatic following antibiotic course, sputum to be submitted for testing singulair daily to be started   Follow Up Instructions:    I discussed the assessment and treatment plan with the patient. The patient was provided an opportunity to ask questions and all were answered. The patient agreed with the plan and demonstrated an understanding of the instructions.   The patient was advised to call back or seek an in-person evaluation if the symptoms worsen or if the condition fails to improve as anticipated.  I provided 8 minutes of non-face-to-face time during this encounter.   Syliva Overman, MD

## 2021-01-26 ENCOUNTER — Encounter: Payer: Self-pay | Admitting: Family Medicine

## 2021-01-26 DIAGNOSIS — J329 Chronic sinusitis, unspecified: Secondary | ICD-10-CM | POA: Insufficient documentation

## 2021-01-26 DIAGNOSIS — R059 Cough, unspecified: Secondary | ICD-10-CM | POA: Insufficient documentation

## 2021-01-26 NOTE — Assessment & Plan Note (Signed)
Persistent pressure and drainage despite antibiotic course, no fevr or chills, add singulair daily and chlorpheniramine for short course Submit specimen for c/s

## 2021-01-26 NOTE — Assessment & Plan Note (Signed)
Chronic cough in asthmatic following antibiotic course, sputum to be submitted for testing singulair daily to be started

## 2021-01-29 NOTE — Progress Notes (Signed)
Chief Complaint:   OBESITY Jacqueline Collins is here to discuss her progress with her obesity treatment plan along with follow-up of her obesity related diagnoses.   Today's visit was #: 7 Starting weight: 391 lbs Starting date: 01/13/2020 Today's weight: 395 lbs Today's date: 01/19/2021 Weight change since last visit: 6 lbs Total lbs lost to date: +4 lbs Body mass index is 72.25 kg/m.   Interim History:  Jacqueline Collins says she has decided to pursue bariatric surgery. We discussed benefits vs risks as well as the process to get started.  Current Meal Plan: practicing portion control and making smarter food choices, such as increasing vegetables and decreasing simple carbohydrates for 0% of the time.  Current Exercise Plan: Increased walking.  Assessment/Plan:   Orders Placed This Encounter  Procedures   Amb Referral to Bariatric Surgery   1. Prediabetes Not at goal. Goal is HgbA1c < 5.7.  Medication: None.    Plan:  She will continue to focus on protein-rich, low simple carbohydrate foods. We reviewed the importance of hydration, regular exercise for stress reduction, and restorative sleep.   Lab Results  Component Value Date   HGBA1C 5.9 (A) 01/06/2020   HGBA1C 5.9 01/06/2020   HGBA1C 5.9 01/06/2020   HGBA1C 5.9 01/06/2020   2. Lower extremity edema Jacqueline Collins takes Lasix 20 mg daily as needed for edema. Plan:  The current medical regimen is effective;  continue present plan and medications.  3. Essential hypertension At goal. Medications: None.   Plan: Avoid buying foods that are: processed, frozen, or prepackaged to avoid excess salt. We will watch for signs of hypotension as she continues lifestyle modifications.  BP Readings from Last 3 Encounters:  01/19/21 110/76  11/10/20 129/87  09/30/20 130/82   Lab Results  Component Value Date   CREATININE 0.84 08/24/2020   4. Chronic idiopathic constipation This problem is moderately controlled. Jacqueline Collins was informed that a decrease  in bowel movement frequency is normal while losing weight, but stools should not be hard or painful.  Counseling: Getting to Good Bowel Health: Your goal is to have one soft bowel movement each day. Drink at least 8 glasses of water each day. Eat plenty of fiber (goal is over 30 grams each day). It is best to get most of your fiber from dietary sources which includes leafy green vegetables, fresh fruit, and whole grains. You may need to add fiber with the help of OTC fiber supplements. These include Metamucil, Citrucel, and Benefiber. If you are still having trouble, try adding an osmotic laxative such as Miralax. If all of these changes do not work, Dietitian.   5. Anxiety with depression Jacqueline Collins is taking propranolol 20 mg three times daily as needed for anxiety and Wellbutrin XL 150 mg daily.  She needs a Therapist, nutritional.    6. At risk for heart disease Due to Jacqueline Collins's current state of health and medical condition(s), she is at a higher risk for heart disease.  This puts the patient at much greater risk to subsequently develop cardiopulmonary conditions that can significantly affect patient's quality of life in a negative manner.    At least 8 minutes were spent on counseling Jacqueline Collins about these concerns today. Evidence-based interventions for health behavior change were utilized today including the discussion of self monitoring techniques, problem-solving barriers, and SMART goal setting techniques.  Specifically, regarding patient's less desirable eating habits and patterns, we employed the technique of small changes when Janyth has not been able to  fully commit to her prudent nutritional plan.  7. Obesity, current BMI 72.2  - Amb Referral to Bariatric Surgery  Course: Jacqueline Collins is currently in the action stage of change. As such, her goal is to continue with weight loss efforts.   Nutrition goals: She has agreed to practicing portion control and making smarter food choices, such as  increasing vegetables and decreasing simple carbohydrates.   Exercise goals:  As is.  Behavioral modification strategies: increasing lean protein intake, decreasing simple carbohydrates, increasing vegetables, and increasing water intake.  Jacqueline Collins has agreed to follow-up with our clinic in 4 weeks. She was informed of the importance of frequent follow-up visits to maximize her success with intensive lifestyle modifications for her multiple health conditions.   Objective:   Blood pressure 110/76, pulse 87, temperature 98 F (36.7 C), temperature source Oral, height 5\' 2"  (1.575 m), weight (!) 395 lb (179.2 kg), SpO2 96 %. Body mass index is 72.25 kg/m.  General: Cooperative, alert, well developed, in no acute distress. HEENT: Conjunctivae and lids unremarkable. Cardiovascular: Regular rhythm.  Lungs: Normal work of breathing. Neurologic: No focal deficits.   Lab Results  Component Value Date   CREATININE 0.84 08/24/2020   BUN 7 08/24/2020   NA 138 08/24/2020   K 4.4 08/24/2020   CL 101 08/24/2020   CO2 22 08/24/2020   Lab Results  Component Value Date   ALT 9 08/24/2020   AST 10 08/24/2020   ALKPHOS 117 08/24/2020   BILITOT 0.4 08/24/2020   Lab Results  Component Value Date   HGBA1C 5.9 (A) 01/06/2020   HGBA1C 5.9 01/06/2020   HGBA1C 5.9 01/06/2020   HGBA1C 5.9 01/06/2020   Lab Results  Component Value Date   TSH 3.570 01/13/2020   Lab Results  Component Value Date   CHOL 175 01/13/2020   HDL 38 (L) 01/13/2020   LDLCALC 114 (H) 01/13/2020   TRIG 129 01/13/2020   Lab Results  Component Value Date   VD25OH 14.2 (L) 01/13/2020   Lab Results  Component Value Date   WBC 8.3 08/24/2020   HGB 12.8 08/24/2020   HCT 38.0 08/24/2020   MCV 93 08/24/2020   PLT 327 08/24/2020   Lab Results  Component Value Date   IRON 84 01/13/2020   TIBC 353 01/13/2020   FERRITIN 82 01/13/2020   Attestation Statements:   Reviewed by clinician on day of visit: allergies,  medications, problem list, medical history, surgical history, family history, social history, and previous encounter notes.  I, 01/15/2020, CMA, am acting as transcriptionist for Insurance claims handler, DO  I have reviewed the above documentation for accuracy and completeness, and I agree with the above. Helane Rima, DO

## 2021-02-07 ENCOUNTER — Ambulatory Visit: Payer: 59 | Admitting: Internal Medicine

## 2021-02-07 ENCOUNTER — Encounter: Payer: Self-pay | Admitting: Nurse Practitioner

## 2021-02-07 ENCOUNTER — Other Ambulatory Visit: Payer: Self-pay

## 2021-02-07 ENCOUNTER — Telehealth: Payer: 59 | Admitting: Nurse Practitioner

## 2021-02-07 DIAGNOSIS — R059 Cough, unspecified: Secondary | ICD-10-CM

## 2021-02-07 DIAGNOSIS — N926 Irregular menstruation, unspecified: Secondary | ICD-10-CM | POA: Insufficient documentation

## 2021-02-07 MED ORDER — PREDNISONE 20 MG PO TABS: 40.0000 mg | ORAL_TABLET | Freq: Every day | ORAL | 0 refills | Status: DC

## 2021-02-07 MED ORDER — AMOXICILLIN-POT CLAVULANATE 875-125 MG PO TABS: 1.0000 | ORAL_TABLET | Freq: Two times a day (BID) | ORAL | 0 refills | Status: DC

## 2021-02-07 NOTE — Assessment & Plan Note (Signed)
-  ongoing for 3 weeks -CXR orderd -she never dropped of sputum culture -Rx. augmentin and prednisone

## 2021-02-07 NOTE — Assessment & Plan Note (Signed)
-  she would like GYN referral -will honor that request
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
## 2021-02-08 ENCOUNTER — Other Ambulatory Visit: Payer: Self-pay

## 2021-02-08 ENCOUNTER — Ambulatory Visit (HOSPITAL_COMMUNITY)
Admission: RE | Admit: 2021-02-08 | Discharge: 2021-02-08 | Disposition: A | Discharge status: Home / Self Care | Payer: 59 | Source: Ambulatory Visit | Attending: Nurse Practitioner | Admitting: Nurse Practitioner

## 2021-02-08 ENCOUNTER — Telehealth: Payer: Self-pay

## 2021-02-08 DIAGNOSIS — R059 Cough, unspecified: Secondary | ICD-10-CM | POA: Diagnosis present

## 2021-02-08 NOTE — Telephone Encounter (Signed)
Pt called and states the current behavioral health services she is using with [facility] is not working out for her and she would like to see someone else.

## 2021-02-08 NOTE — Progress Notes (Signed)
CXR was negative.

## 2021-02-09 ENCOUNTER — Other Ambulatory Visit: Payer: Self-pay | Admitting: *Deleted

## 2021-02-09 DIAGNOSIS — F418 Other specified anxiety disorders: Secondary | ICD-10-CM

## 2021-02-09 NOTE — Telephone Encounter (Signed)
Referral placed for pt

## 2021-02-17 ENCOUNTER — Ambulatory Visit (INDEPENDENT_AMBULATORY_CARE_PROVIDER_SITE_OTHER): Payer: 59 | Admitting: Family Medicine

## 2021-02-17 ENCOUNTER — Encounter (INDEPENDENT_AMBULATORY_CARE_PROVIDER_SITE_OTHER): Payer: Self-pay | Admitting: Family Medicine

## 2021-02-17 ENCOUNTER — Other Ambulatory Visit: Payer: Self-pay

## 2021-02-17 VITALS — BP 116/72 | HR 87 | Temp 98.0°F | Ht 62.0 in | Wt 384.0 lb

## 2021-02-17 DIAGNOSIS — B379 Candidiasis, unspecified: Secondary | ICD-10-CM

## 2021-02-17 DIAGNOSIS — Z9189 Other specified personal risk factors, not elsewhere classified: Secondary | ICD-10-CM | POA: Diagnosis not present

## 2021-02-17 DIAGNOSIS — R7303 Prediabetes: Secondary | ICD-10-CM

## 2021-02-17 DIAGNOSIS — Z6841 Body Mass Index (BMI) 40.0 and over, adult: Secondary | ICD-10-CM

## 2021-02-17 DIAGNOSIS — F419 Anxiety disorder, unspecified: Secondary | ICD-10-CM

## 2021-02-17 MED ORDER — FLUCONAZOLE 150 MG PO TABS: 150.0000 mg | ORAL_TABLET | Freq: Once | ORAL | 0 refills | Status: AC

## 2021-02-17 MED ORDER — FLUOXETINE HCL 20 MG PO TABS: 20.0000 mg | ORAL_TABLET | Freq: Every day | ORAL | 0 refills | Status: DC

## 2021-02-18 ENCOUNTER — Ambulatory Visit: Payer: 59 | Admitting: Internal Medicine

## 2021-02-18 ENCOUNTER — Encounter: Payer: Self-pay | Admitting: Internal Medicine

## 2021-02-18 VITALS — BP 120/83 | HR 87 | Temp 98.2°F | Resp 20 | Ht 65.0 in | Wt 397.0 lb

## 2021-02-18 DIAGNOSIS — R600 Localized edema: Secondary | ICD-10-CM

## 2021-02-18 DIAGNOSIS — F418 Other specified anxiety disorders: Secondary | ICD-10-CM | POA: Diagnosis not present

## 2021-02-18 DIAGNOSIS — K5904 Chronic idiopathic constipation: Secondary | ICD-10-CM

## 2021-02-18 DIAGNOSIS — Z0000 Encounter for general adult medical examination without abnormal findings: Secondary | ICD-10-CM

## 2021-02-18 DIAGNOSIS — Z0001 Encounter for general adult medical examination with abnormal findings: Secondary | ICD-10-CM | POA: Diagnosis not present

## 2021-02-18 DIAGNOSIS — Z1159 Encounter for screening for other viral diseases: Secondary | ICD-10-CM

## 2021-02-18 DIAGNOSIS — G4733 Obstructive sleep apnea (adult) (pediatric): Secondary | ICD-10-CM

## 2021-02-18 DIAGNOSIS — E876 Hypokalemia: Secondary | ICD-10-CM

## 2021-02-18 MED ORDER — FUROSEMIDE 20 MG PO TABS: 20.0000 mg | ORAL_TABLET | Freq: Every day | ORAL | 0 refills | Status: DC

## 2021-02-18 MED ORDER — POTASSIUM CHLORIDE CRYS ER 20 MEQ PO TBCR
20.0000 meq | EXTENDED_RELEASE_TABLET | Freq: Every day | ORAL | 3 refills | Status: DC
Start: 2021-02-18 — End: 2021-05-23

## 2021-02-18 NOTE — Assessment & Plan Note (Addendum)
Takes Lactulose PRN Has persistent constipation Advised to use Colace PRN and prune juice Referred to GI per patient request

## 2021-02-18 NOTE — Assessment & Plan Note (Signed)
Needs CPAP device Advised to contact Neurology office 

## 2021-02-18 NOTE — Assessment & Plan Note (Signed)
Did not have much benefit with Wellbutrin [facility] started Prozac 20 mg QD, patient has not started taking it. DC Wellbutrin, advised to take Prozac

## 2021-02-18 NOTE — Patient Instructions (Signed)
Please stop taking Wellbutrin and start taking Prozac as prescribed.  Please contact your Neurologist for migraine medication.  Please let us know about your decision about Bariatric surgery.

## 2021-02-18 NOTE — Assessment & Plan Note (Signed)
Followed by [facility] clinic Also being evaluated by Bariatric surgery Advised to contact when ready for surgery for preop evaluation Continue low carb diet

## 2021-02-18 NOTE — Assessment & Plan Note (Signed)
Refilled Lasix Started potassium supplement to avoid hypokalemia
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
## 2021-02-21 ENCOUNTER — Encounter: Payer: Self-pay | Admitting: Neurology

## 2021-02-21 NOTE — Progress Notes (Signed)
Chief Complaint:   OBESITY Jacqueline Collins is here to discuss her progress with her obesity treatment plan along with follow-up of her obesity related diagnoses.   Today's visit was #: 8 Starting weight: 391 lbs Starting date: 01/13/2020 Today's weight: 284 lbs Today's date: 02/17/2021 Weight change since last visit: 11 lbs Total lbs lost to date: 107 lbs Body mass index is 70.23 kg/m.  Total weight loss percentage to date: -27.37%  Current Meal Plan: practicing portion control and making smarter food choices, such as increasing vegetables and decreasing simple carbohydrates for 80-90% of the time.  Current Exercise Plan: Walking/strength training/stretching for 20-30 minutes 7 times per week.  Interim History:  Angelmarie says that bariatric surgery is on hold.  She reports that her anxiety level is very high and she is cycling more often.  She has a history of taking Wellbutrin.  Wins:  PC meals, 3 times per day.  Increased water intake and decreased snacking.  She has been meditating and walking an extra 20 minutes per day.  She has had no fast food.  Assessment/Plan:   1. Yeast infection Status post recent antibiotics.  Courtesy fill.  - Start fluconazole (DIFLUCAN) 150 MG tablet; Take 1 tablet (150 mg total) by mouth once for 1 dose. Then again in 3 days if needed  Dispense: 2 tablet; Refill: 0  2. Prediabetes Not at goal. Goal is HgbA1c < 5.7.  Medication: None.    Plan:  She will continue to focus on protein-rich, low simple carbohydrate foods. We reviewed the importance of hydration, regular exercise for stress reduction, and restorative sleep.   Lab Results  Component Value Date   HGBA1C 5.9 (A) 01/06/2020   HGBA1C 5.9 01/06/2020   HGBA1C 5.9 01/06/2020   HGBA1C 5.9 01/06/2020   3. Anxiety Gerturde will start Prozac 20 mg daily.  She needs more therapy.    - Start FLUoxetine (PROZAC) 20 MG tablet; Take 1 tablet (20 mg total) by mouth daily.  Dispense: 30 tablet; Refill:  0  4. At risk for heart disease Due to Fleta's current state of health and medical condition(s), she is at a higher risk for heart disease.  This puts the patient at much greater risk to subsequently develop cardiopulmonary conditions that can significantly affect patient's quality of life in a negative manner.    At least 8 minutes were spent on counseling Melitza about these concerns today. Evidence-based interventions for health behavior change were utilized today including the discussion of self monitoring techniques, problem-solving barriers, and SMART goal setting techniques.  Specifically, regarding patient's less desirable eating habits and patterns, we employed the technique of small changes when Caniyah has not been able to fully commit to her prudent nutritional plan.  5. Obesity, current BMI 70.3  Course: Karsynn is currently in the action stage of change. As such, her goal is to continue with weight loss efforts.   Nutrition goals: She has agreed to practicing portion control and making smarter food choices, such as increasing vegetables and decreasing simple carbohydrates.   Exercise goals:  As is.  Behavioral modification strategies: increasing lean protein intake, decreasing simple carbohydrates, increasing vegetables, increasing water intake, and emotional eating strategies.  Kailana has agreed to follow-up with our clinic in 4 weeks. She was informed of the importance of frequent follow-up visits to maximize her success with intensive lifestyle modifications for her multiple health conditions.   Objective:   Blood pressure 116/72, pulse 87, temperature 98 F (36.7 C),  temperature source Oral, height 5\' 2"  (1.575 m), weight (!) 384 lb (174.2 kg), SpO2 96 %. Body mass index is 70.23 kg/m.  General: Cooperative, alert, well developed, in no acute distress. HEENT: Conjunctivae and lids unremarkable. Cardiovascular: Regular rhythm.  Lungs: Normal work of breathing. Neurologic:  No focal deficits.   Lab Results  Component Value Date   CREATININE 0.84 08/24/2020   BUN 7 08/24/2020   NA 138 08/24/2020   K 4.4 08/24/2020   CL 101 08/24/2020   CO2 22 08/24/2020   Lab Results  Component Value Date   ALT 9 08/24/2020   AST 10 08/24/2020   ALKPHOS 117 08/24/2020   BILITOT 0.4 08/24/2020   Lab Results  Component Value Date   HGBA1C 5.9 (A) 01/06/2020   HGBA1C 5.9 01/06/2020   HGBA1C 5.9 01/06/2020   HGBA1C 5.9 01/06/2020   Lab Results  Component Value Date   TSH 3.570 01/13/2020   Lab Results  Component Value Date   CHOL 175 01/13/2020   HDL 38 (L) 01/13/2020   LDLCALC 114 (H) 01/13/2020   TRIG 129 01/13/2020   Lab Results  Component Value Date   VD25OH 14.2 (L) 01/13/2020   Lab Results  Component Value Date   WBC 8.3 08/24/2020   HGB 12.8 08/24/2020   HCT 38.0 08/24/2020   MCV 93 08/24/2020   PLT 327 08/24/2020   Lab Results  Component Value Date   IRON 84 01/13/2020   TIBC 353 01/13/2020   FERRITIN 82 01/13/2020   Attestation Statements:   Reviewed by clinician on day of visit: allergies, medications, problem list, medical history, surgical history, family history, social history, and previous encounter notes.  I, 01/15/2020, CMA, am acting as transcriptionist for Insurance claims handler, DO  I have reviewed the above documentation for accuracy and completeness, and I agree with the above. Helane Rima, DO

## 2021-02-22 ENCOUNTER — Encounter: Payer: Self-pay | Admitting: Internal Medicine

## 2021-02-22 ENCOUNTER — Telehealth (INDEPENDENT_AMBULATORY_CARE_PROVIDER_SITE_OTHER): Payer: 59 | Admitting: Family Medicine

## 2021-02-22 ENCOUNTER — Encounter: Payer: Self-pay | Admitting: Family Medicine

## 2021-02-22 ENCOUNTER — Encounter: Payer: Self-pay | Admitting: Gastroenterology

## 2021-02-22 DIAGNOSIS — G4733 Obstructive sleep apnea (adult) (pediatric): Secondary | ICD-10-CM

## 2021-02-22 DIAGNOSIS — G43709 Chronic migraine without aura, not intractable, without status migrainosus: Secondary | ICD-10-CM

## 2021-02-22 MED ORDER — UBRELVY 100 MG PO TABS: 100.0000 mg | ORAL_TABLET | Freq: Every day | ORAL | 11 refills | Status: DC | PRN

## 2021-02-22 MED ORDER — EMGALITY 120 MG/ML ~~LOC~~ SOAJ: 120.0000 mg | SUBCUTANEOUS | 1 refills | Status: DC

## 2021-02-22 NOTE — Patient Instructions (Signed)
Below is our plan:  We will stop Ajovy. Start Emgality injection every 30 days. I will send new order for Ubrelvy to use to get rid of migraine. Please take 1 tablet at onset of headache. May take 1 additional tablet in 2 hours if needed. Do not take more than 2 tablets in 24 hours or more than 8 in a month. Please limit use of Excedrin. Please call [facility] to discuss how to get CPAP machine. May need new order if voided.    Please make sure you are staying well hydrated. I recommend 50-60 ounces daily. Well balanced diet and regular exercise encouraged. Consistent sleep schedule with 6-8 hours recommended.   Please continue follow up with care team as directed.   Follow up with me in 3 months   You may receive a survey regarding today's visit. I encourage you to leave honest feed back as I do use this information to improve patient care. Thank you for seeing me today!
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
## 2021-02-23 ENCOUNTER — Telehealth: Payer: Self-pay

## 2021-02-23 ENCOUNTER — Telehealth: Payer: Self-pay | Admitting: Neurology

## 2021-02-23 NOTE — Telephone Encounter (Signed)
Release form surgery  Copied Noted Sleeved

## 2021-02-23 NOTE — Telephone Encounter (Signed)
PA completed on [facility] [id number] Will wait for response from insurance

## 2021-02-24 ENCOUNTER — Other Ambulatory Visit: Payer: Self-pay | Admitting: Internal Medicine

## 2021-02-24 ENCOUNTER — Other Ambulatory Visit: Payer: Self-pay | Admitting: *Deleted

## 2021-02-24 DIAGNOSIS — E559 Vitamin D deficiency, unspecified: Secondary | ICD-10-CM

## 2021-02-24 DIAGNOSIS — E782 Mixed hyperlipidemia: Secondary | ICD-10-CM

## 2021-02-24 LAB — CBC WITH DIFFERENTIAL/PLATELET
Basophils Absolute: 0 10*3/uL (ref 0.0–0.2)
Basos: 0 %
EOS (ABSOLUTE): 0.1 10*3/uL (ref 0.0–0.4)
Eos: 1 %
Hematocrit: 38.4 % (ref 34.0–46.6)
Hemoglobin: 13.1 g/dL (ref 11.1–15.9)
Immature Grans (Abs): 0 10*3/uL (ref 0.0–0.1)
Immature Granulocytes: 0 %
Lymphocytes Absolute: 3.3 10*3/uL — ABNORMAL HIGH (ref 0.7–3.1)
Lymphs: 29 %
MCH: 31 pg (ref 26.6–33.0)
MCHC: 34.1 g/dL (ref 31.5–35.7)
MCV: 91 fL (ref 79–97)
Monocytes Absolute: 0.5 10*3/uL (ref 0.1–0.9)
Monocytes: 5 %
Neutrophils Absolute: 7.2 10*3/uL — ABNORMAL HIGH (ref 1.4–7.0)
Neutrophils: 65 %
Platelets: 350 10*3/uL (ref 150–450)
RBC: 4.23 x10E6/uL (ref 3.77–5.28)
RDW: 12.5 % (ref 11.7–15.4)
WBC: 11.2 10*3/uL — ABNORMAL HIGH (ref 3.4–10.8)

## 2021-02-24 LAB — CMP14+EGFR
ALT: 17 IU/L (ref 0–32)
AST: 11 IU/L (ref 0–40)
Albumin/Globulin Ratio: 1.6 (ref 1.2–2.2)
Albumin: 4.1 g/dL (ref 3.8–4.8)
Alkaline Phosphatase: 109 IU/L (ref 44–121)
BUN/Creatinine Ratio: 11 (ref 9–23)
BUN: 10 mg/dL (ref 6–20)
Bilirubin Total: 0.3 mg/dL (ref 0.0–1.2)
CO2: 20 mmol/L (ref 20–29)
Calcium: 9.3 mg/dL (ref 8.7–10.2)
Chloride: 103 mmol/L (ref 96–106)
Creatinine, Ser: 0.91 mg/dL (ref 0.57–1.00)
Globulin, Total: 2.6 g/dL (ref 1.5–4.5)
Glucose: 109 mg/dL — ABNORMAL HIGH (ref 65–99)
Potassium: 4.6 mmol/L (ref 3.5–5.2)
Sodium: 139 mmol/L (ref 134–144)
Total Protein: 6.7 g/dL (ref 6.0–8.5)
eGFR: 85 mL/min/{1.73_m2} (ref >59)

## 2021-02-24 LAB — HEMOGLOBIN A1C
Est. average glucose Bld gHb Est-mCnc: 128 mg/dL
Hgb A1c MFr Bld: 6.1 % — ABNORMAL HIGH (ref 4.8–5.6)

## 2021-02-24 LAB — LIPID PANEL
Chol/HDL Ratio: 5 ratio — ABNORMAL HIGH (ref 0.0–4.4)
Cholesterol, Total: 212 mg/dL — ABNORMAL HIGH (ref 100–199)
HDL: 42 mg/dL (ref >39)
LDL Chol Calc (NIH): 149 mg/dL — ABNORMAL HIGH (ref 0–99)
Triglycerides: 117 mg/dL (ref 0–149)
VLDL Cholesterol Cal: 21 mg/dL (ref 5–40)

## 2021-02-24 LAB — HEPATITIS C ANTIBODY: Hep C Virus Ab: <0.1 s/co ratio (ref 0.0–0.9)

## 2021-02-24 LAB — TSH+FREE T4
Free T4: 1.1 ng/dL (ref 0.82–1.77)
TSH: 3.14 u[IU]/mL (ref 0.450–4.500)

## 2021-02-24 LAB — VITAMIN D 25 HYDROXY (VIT D DEFICIENCY, FRACTURES): Vit D, 25-Hydroxy: 15.8 ng/mL — ABNORMAL LOW (ref 30.0–100.0)

## 2021-02-24 MED ORDER — ROSUVASTATIN CALCIUM 10 MG PO TABS: 10.0000 mg | ORAL_TABLET | Freq: Every day | ORAL | 1 refills | Status: DC

## 2021-02-24 MED ORDER — VITAMIN D (ERGOCALCIFEROL) 1.25 MG (50000 UNIT) PO CAPS: 50000.0000 [IU] | ORAL_CAPSULE | ORAL | 5 refills | Status: DC

## 2021-02-24 MED ORDER — UBRELVY 100 MG PO TABS: 100.0000 mg | ORAL_TABLET | Freq: Every day | ORAL | 11 refills | Status: DC | PRN

## 2021-02-28 NOTE — Telephone Encounter (Signed)
"  This medication or product is on your plan's list of covered drugs. Prior authorization is not required at this time. If your pharmacy has questions regarding the processing of your prescription, please have them call the [facility] pharmacy help desk at [phone]. **Please note: This request was submitted electronically. Formulary lowering, tiering exception, cost reduction and/or pre-benefit determination review (including prospective Medicare hospice reviews) requests cannot be requested using this method of submission. Providers contact us at [phone] for further assistance."  Pt should be able to use copay card

## 2021-03-02 ENCOUNTER — Telehealth: Payer: Self-pay

## 2021-03-02 ENCOUNTER — Encounter: Payer: 59 | Admitting: Adult Health

## 2021-03-02 DIAGNOSIS — Z0279 Encounter for issue of other medical certificate: Secondary | ICD-10-CM

## 2021-03-02 NOTE — Telephone Encounter (Signed)
Called pt per request to reschedule her appt.

## 2021-03-03 NOTE — Telephone Encounter (Signed)
Complete

## 2021-03-10 ENCOUNTER — Telehealth: Payer: Self-pay | Admitting: Licensed Clinical Social Worker

## 2021-03-10 NOTE — Telephone Encounter (Signed)
On January 25 2021 through text message at 831 am; Patient requested to discontinue service at this time.

## 2021-03-16 ENCOUNTER — Encounter: Payer: 59 | Admitting: Adult Health

## 2021-03-22 ENCOUNTER — Ambulatory Visit (INDEPENDENT_AMBULATORY_CARE_PROVIDER_SITE_OTHER): Payer: 59 | Admitting: Licensed Clinical Social Worker

## 2021-03-22 DIAGNOSIS — F411 Generalized anxiety disorder: Secondary | ICD-10-CM

## 2021-03-22 DIAGNOSIS — F321 Major depressive disorder, single episode, moderate: Secondary | ICD-10-CM | POA: Diagnosis not present
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
## 2021-03-25 ENCOUNTER — Encounter (INDEPENDENT_AMBULATORY_CARE_PROVIDER_SITE_OTHER): Payer: Self-pay | Admitting: Family Medicine

## 2021-03-28 ENCOUNTER — Ambulatory Visit (INDEPENDENT_AMBULATORY_CARE_PROVIDER_SITE_OTHER): Payer: 59 | Admitting: Family Medicine

## 2021-03-31 ENCOUNTER — Ambulatory Visit (INDEPENDENT_AMBULATORY_CARE_PROVIDER_SITE_OTHER): Payer: 59 | Admitting: Family Medicine

## 2021-03-31 ENCOUNTER — Encounter (INDEPENDENT_AMBULATORY_CARE_PROVIDER_SITE_OTHER): Payer: Self-pay

## 2021-03-31 ENCOUNTER — Other Ambulatory Visit: Payer: Self-pay

## 2021-03-31 ENCOUNTER — Encounter (INDEPENDENT_AMBULATORY_CARE_PROVIDER_SITE_OTHER): Payer: Self-pay | Admitting: Family Medicine

## 2021-03-31 VITALS — BP 109/72 | HR 66 | Temp 97.9°F | Ht 62.0 in | Wt 380.0 lb

## 2021-03-31 DIAGNOSIS — K5909 Other constipation: Secondary | ICD-10-CM

## 2021-03-31 DIAGNOSIS — Z6841 Body Mass Index (BMI) 40.0 and over, adult: Secondary | ICD-10-CM

## 2021-03-31 DIAGNOSIS — F321 Major depressive disorder, single episode, moderate: Secondary | ICD-10-CM | POA: Diagnosis not present

## 2021-03-31 DIAGNOSIS — F329 Major depressive disorder, single episode, unspecified: Secondary | ICD-10-CM

## 2021-03-31 DIAGNOSIS — G4733 Obstructive sleep apnea (adult) (pediatric): Secondary | ICD-10-CM

## 2021-03-31 DIAGNOSIS — R7303 Prediabetes: Secondary | ICD-10-CM

## 2021-03-31 MED ORDER — TIRZEPATIDE 2.5 MG/0.5ML ~~LOC~~ SOAJ: 2.5000 mg | SUBCUTANEOUS | 0 refills | Status: DC

## 2021-03-31 MED ORDER — FLUOXETINE HCL 20 MG PO TABS: 20.0000 mg | ORAL_TABLET | Freq: Every day | ORAL | 0 refills | Status: DC

## 2021-03-31 NOTE — Progress Notes (Signed)
Chief Complaint:   OBESITY Jacqueline Collins is here to discuss her progress with her obesity treatment plan along with follow-up of her obesity related diagnoses.   Today's visit was #: 9 Starting weight: 391 lbs Starting date: 01/13/2020 Today's weight: 380 lbs Today's date: 03/31/2021 Weight change since last visit: 4 lbs Total lbs lost to date: 11 lbs Body mass index is 69.5 kg/m.  Total weight loss percentage to date: -2.81%  Current Meal Plan: practicing portion control and making smarter food choices, such as increasing vegetables and decreasing simple carbohydrates for 0% of the time.  Current Exercise Plan: Increased activity.  Interim History:  Jacqueline Collins says she still has not heard from the DME company re: her CPAP.  She is still dealing with the disability process.  Her partner is on our HWW plan as well now. Her constipation is controlled with increased fruits, water, and MiraLAX.  Assessment/Plan:   1. OSA (obstructive sleep apnea) OSA is a cause of systemic hypertension and is associated with an increased incidence of stroke, heart failure, atrial fibrillation, and coronary heart disease. Severe OSA increases all-cause mortality and cardiovascular mortality.   Goal: Treatment of OSA via CPAP compliance and weight loss. Plasma ghrelin levels (appetite or "hunger hormone") are significantly higher in OSA patients than in BMI-matched controls, but decrease to levels similar to those of obese patients without OSA after CPAP treatment.  Weight loss improves OSA by several mechanisms, including reduction in fatty tissue in the throat (i.e. parapharyngeal fat) and the tongue. Loss of abdominal fat increases mediastinal traction on the upper airway making it less likely to collapse during sleep. Studies have also shown that compliance with CPAP treatment improves leptin (hunger inhibitory hormone) imbalance.  2. Prediabetes, with polyphagia Not at goal. Goal is HgbA1c < 5.7.   Medication: None.    Plan:  Start Mounjaro 2.5 mg subcutaneously weekly, as per below.  She will continue to focus on protein-rich, low simple carbohydrate foods. We reviewed the importance of hydration, regular exercise for stress reduction, and restorative sleep.   Lab Results  Component Value Date   HGBA1C 6.1 (H) 02/23/2021   - Start tirzepatide [facility]) 2.5 MG/0.5ML Pen; Inject 2.5 mg into the skin once a week.  Dispense: 2 mL; Refill: 0  3. Chronic constipation This problem is controlled with increased water intake, eating more fruits, and taking MiraLAX.  Jacqueline Collins was informed that a decrease in bowel movement frequency is normal while losing weight, but stools should not be hard or painful.  Counseling: Getting to Good Bowel Health: Your goal is to have one soft bowel movement each day. Drink at least 8 glasses of water each day. Eat plenty of fiber (goal is over 30 grams each day). It is best to get most of your fiber from dietary sources which includes leafy green vegetables, fresh fruit, and whole grains. You may need to add fiber with the help of OTC fiber supplements. These include Metamucil, Citrucel, and Benefiber. If you are still having trouble, try adding an osmotic laxative such as Miralax. If all of these changes do not work, Dietitian.   4. Current episode of major depressive disorder  Improving. Will refill Prozac 20 mg daily today, as per below.  - Refill FLUoxetine (PROZAC) 20 MG tablet; Take 1 tablet (20 mg total) by mouth daily.  Dispense: 30 tablet; Refill: 0  5. Obesity, current BMI 69.5  Course: Jacqueline Collins is currently in the action stage of change. As  such, her goal is to continue with weight loss efforts.   Nutrition goals: She has agreed to practicing portion control and making smarter food choices, such as increasing vegetables and decreasing simple carbohydrates.   Exercise goals:  As is.  Behavioral modification strategies: increasing lean  protein intake, decreasing simple carbohydrates, increasing vegetables, and increasing water intake.  Jacqueline Collins has agreed to follow-up with our clinic in 4 weeks. She was informed of the importance of frequent follow-up visits to maximize her success with intensive lifestyle modifications for her multiple health conditions.   Objective:   Blood pressure 109/72, pulse 66, temperature 97.9 F (36.6 C), temperature source Oral, height 5\' 2"  (1.575 m), weight (!) 380 lb (172.4 kg), SpO2 96 %. Body mass index is 69.5 kg/m.  General: Cooperative, alert, well developed, in no acute distress. HEENT: Conjunctivae and lids unremarkable. Cardiovascular: Regular rhythm.  Lungs: Normal work of breathing. Neurologic: No focal deficits.   Lab Results  Component Value Date   CREATININE 0.91 02/23/2021   BUN 10 02/23/2021   NA 139 02/23/2021   K 4.6 02/23/2021   CL 103 02/23/2021   CO2 20 02/23/2021   Lab Results  Component Value Date   ALT 17 02/23/2021   AST 11 02/23/2021   ALKPHOS 109 02/23/2021   BILITOT 0.3 02/23/2021   Lab Results  Component Value Date   HGBA1C 6.1 (H) 02/23/2021   HGBA1C 5.9 (A) 01/06/2020   HGBA1C 5.9 01/06/2020   HGBA1C 5.9 01/06/2020   HGBA1C 5.9 01/06/2020   Lab Results  Component Value Date   TSH 3.140 02/23/2021   Lab Results  Component Value Date   CHOL 212 (H) 02/23/2021   HDL 42 02/23/2021   LDLCALC 149 (H) 02/23/2021   TRIG 117 02/23/2021   CHOLHDL 5.0 (H) 02/23/2021   Lab Results  Component Value Date   VD25OH 15.8 (L) 02/23/2021   VD25OH 14.2 (L) 01/13/2020   Lab Results  Component Value Date   WBC 11.2 (H) 02/23/2021   HGB 13.1 02/23/2021   HCT 38.4 02/23/2021   MCV 91 02/23/2021   PLT 350 02/23/2021   Lab Results  Component Value Date   IRON 84 01/13/2020   TIBC 353 01/13/2020   FERRITIN 82 01/13/2020   Attestation Statements:   Reviewed by clinician on day of visit: allergies, medications, problem list, medical history,  surgical history, family history, social history, and previous encounter notes.  Time spent on visit including pre-visit chart review and post-visit care and charting was 50 minutes.   I, 01/15/2020, CMA, am acting as transcriptionist for Insurance claims handler, DO  I have reviewed the above documentation for accuracy and completeness, and I agree with the above. Helane Rima, DO

## 2021-04-11 ENCOUNTER — Ambulatory Visit: Payer: 59 | Admitting: Adult Health

## 2021-04-11 ENCOUNTER — Encounter: Payer: Self-pay | Admitting: Adult Health

## 2021-04-11 ENCOUNTER — Other Ambulatory Visit: Payer: Self-pay

## 2021-04-11 VITALS — BP 111/85 | HR 81 | Ht 62.0 in | Wt 387.4 lb

## 2021-04-11 DIAGNOSIS — N926 Irregular menstruation, unspecified: Secondary | ICD-10-CM

## 2021-04-11 MED ORDER — FLUCONAZOLE 150 MG PO TABS: ORAL_TABLET | ORAL | 1 refills | Status: DC

## 2021-04-11 NOTE — Progress Notes (Signed)
Subjective:     Patient ID: Jacqueline Collins, female   DOB: December 23, 1985, 35 y.o.   MRN: [id number]  HPI Jacqueline Collins is a  35 year old black female, single with female partner, G0P0, in complaining of irregular periods ever since starting at about age 35. She will skip a month, then may be heavy or spot. She and partner thinking of having a baby. She requests rx for diflucan is on antibiotic.   Lab Results  Component Value Date   DIAGPAP  10/23/2019    - Negative for intraepithelial lesion or malignancy (NILM)   HPVHIGH Negative 10/23/2019   PCP is Dr Allena Katz  Review of Systems +irregular periods Reviewed past medical,surgical, social and family history. Reviewed medications and allergies.     Objective:   Physical Exam BP 111/85 (BP Location: Right Arm, Patient Position: Sitting, Cuff Size: Normal)   Pulse 81   Ht 5\' 2"  (1.575 m)   Wt (!) 387 lb 6.4 oz (175.7 kg)   LMP 03/17/2021 (Approximate)   BMI 70.86 kg/m  Skin warm and dry. Neck: mid line trachea, normal thyroid, good ROM, no lymphadenopathy noted. Lungs: clear to ausculation bilaterally. Cardiovascular: regular rate and rhythm.    AA is 2 Fall risk is low Depression screen [facility] 2/9 04/11/2021 02/18/2021 02/07/2021  Decreased Interest 2 3 3   Down, Depressed, Hopeless 3 3 3   PHQ - 2 Score 5 6 6   Altered sleeping 3 3 3   Tired, decreased energy 2 3 3   Change in appetite 1 3 0  Feeling bad or failure about yourself  3 1 0  Trouble concentrating 2 0 3  Moving slowly or fidgety/restless 1 0 0  Suicidal thoughts 0 0 0  PHQ-9 Score 17 16 15   Difficult doing work/chores - - Not difficult at all  Some encounter information is confidential and restricted. Go to Review Flowsheets activity to see all data.  Some recent data might be hidden    GAD 7 : Generalized Anxiety Score 04/11/2021 11/24/2020 11/10/2020 04/07/2020  Nervous, Anxious, on Edge 3 2 3 1   Control/stop worrying 2 2 1 2   Worry too much - different things 2 2 3 2   Trouble  relaxing 0 2 1 3   Restless 0 1 1 3   Easily annoyed or irritable 3 2 3 3   Afraid - awful might happen 1 1 1 1   Total GAD 7 Score 11 12 13 15   Anxiety Difficulty - Somewhat difficult Somewhat difficult Extremely difficult  ON Prozac    Upstream - 04/11/21 1518       Pregnancy Intention Screening   Does the patient want to become pregnant in the next year? Unsure    Does the patient's partner want to become pregnant in the next year? Unsure    Would the patient like to discuss contraceptive options today? Yes      Contraception Wrap Up   Current Method No Method - Other Reason   lesbian relationship   End Method No Method - Other Reason    Contraception Counseling Provided No             Assessment:     1. Irregular menses Will get pelvic 04/18/21 at [facility] at 3:30 pm, to check ovaries, ?PCO  She does not want any hormone manipulation at this time  2. Morbid obesity (HCC) Continue weight loss efforts, on mounjaro    Plan:     Meds ordered this encounter  Medications   fluconazole (  DIFLUCAN) 150 MG tablet    Sig: Take 1 now and 1 in 3 days    Dispense:  2 tablet    Refill:  1    Order Specific Question:   Supervising Provider    Answer:   Lazaro Arms [[id number]]    Will talk when [place] back

## 2021-04-18 ENCOUNTER — Ambulatory Visit (HOSPITAL_COMMUNITY): Payer: 59

## 2021-04-25 ENCOUNTER — Ambulatory Visit (HOSPITAL_COMMUNITY): Payer: 59 | Admitting: Licensed Clinical Social Worker

## 2021-04-25 DIAGNOSIS — F321 Major depressive disorder, single episode, moderate: Secondary | ICD-10-CM

## 2021-04-25 DIAGNOSIS — F411 Generalized anxiety disorder: Secondary | ICD-10-CM

## 2021-04-25 NOTE — Progress Notes (Signed)
   THERAPIST PROGRESS NOTE  Session Time: 2:00 PM to 2:10 PM  Participation Level: Minimal  Behavioral Response: CasualAlertDysphoric  Type of Therapy: Individual Therapy  Treatment Goals addressed:  Address stressors such as family medical, losses, coping for anxiety, depression, work on self-esteem and exploring direction for patient's life Interventions: Solution Focused, Strength-based, and Supportive  Summary: Jacqueline Collins is a 35 y.o. female who presents with she is really sick. Her girlfriend was watching niece gave to girlfriend and then girlfriend gave to patient. Described symptoms of a runny nose, migraines therapist reviewed the patient has assessment to finish patient prefers to complete at next session as she does not feel well.  Minimal session as patient was not feeling well.  Suicidal/Homicidal: No  Plan: Return again in 1 week.2.  Complete assessment including employment, education, childhood history, substance use, therapist recommendation, pain nutrition assessment, treatment plan 3.  Explore stressors such as family issues, medical, not working, explore patient's career aspirations, working on getting her life back on track  Diagnosis: Axis I:  current moderate episode of major depressive disorder unspecified whether recurrent, anxiety state    Axis II: No diagnosis    Coolidge Breeze, LCSW 04/25/2021

## 2021-05-03 ENCOUNTER — Encounter (HOSPITAL_COMMUNITY): Payer: Self-pay

## 2021-05-03 ENCOUNTER — Ambulatory Visit (INDEPENDENT_AMBULATORY_CARE_PROVIDER_SITE_OTHER): Payer: 59 | Admitting: Licensed Clinical Social Worker

## 2021-05-03 DIAGNOSIS — F411 Generalized anxiety disorder: Secondary | ICD-10-CM

## 2021-05-03 DIAGNOSIS — F321 Major depressive disorder, single episode, moderate: Secondary | ICD-10-CM

## 2021-05-03 NOTE — Plan of Care (Signed)
Patient participated in formation of treatment plan

## 2021-05-03 NOTE — Progress Notes (Signed)
Virtual Visit via Video Note  I connected with Jacqueline Collins on 05/03/21 at 10:00 AM EDT by a video enabled telemedicine application and verified that I am speaking with the correct person using two identifiers.  Location: Patient: home Provider: office   I discussed the limitations of evaluation and management by telemedicine and the availability of in person appointments. The patient expressed understanding and agreed to proceed.   I discussed the assessment and treatment plan with the patient. The patient was provided an opportunity to ask questions and all were answered. The patient agreed with the plan and demonstrated an understanding of the instructions.   The patient was advised to call back or seek an in-person evaluation if the symptoms worsen or if the condition fails to improve as anticipated.  I provided 57 minutes of non-face-to-face time during this encounter.  THERAPIST PROGRESS NOTE  Session Time: 10:03 AM to 11:0 AM Participation Level: Active  Behavioral Response: CasualAlertDepressed appropriate in session  Type of Therapy: Individual therapy  Treatment Goals addressed:  Patient learn to control her emotions with current situations, manage anxiety that comes along with depression, decreased depression, explore underlying issues such as grief and relationship with parental figures, coping Interventions: Solution Focused, Strength-based, Supportive, and Other: coping, exploring issues to talk about in therapy  Summary: Jacqueline Collins is a 35 y.o. female who presents with feeling sick but better.  Completed the rest of the assessment and treatment plan patient gave consent to complete treatment plan virtually.  Completing assessment was helpful to further explore issues that are coming up such as grief issues that she has not talked to with anyone, relationship with mom even told mom she is the reason she is even in therapy so the significant issue, described a more  in-depth her relationship with father, ways her childhood was difficult, how not working for 2 years when she was working all the time difficult, having a hard time with purpose and meaning although positive she is applying for disability we will explore with patient some strategies that would keep her busy and where she would feel more fulfilled.  Noted already patient's good coping skills in terms of assessing people to know how to cope, things such as except in social does not fight reality which does not worsen the situation.  Noted anxiety plays a part with depression as well therapist provided active listening open questions supportive interventions. Patient shares coping can be to push things under, so therapy and assess is helpful for patient to start to address these issues in therapy. Suicidal/Homicidal: No  Plan: 1.  Patient will call for new appointment had to cancel next appointment so we will figure out her schedule to make new appointments. 2.  Need to finish pain assessment and nutrition assessment.  Need to explore some of the areas introduced from doing the assessment such as grief, childhood will go in the direction patient feels comfortable also provide education on depression from see [facility] [place] website.  Explore through reframing ways for her to look for more purpose and meaning.  Diagnosis: Axis I: current moderate episode of major depressive disorder unspecified whether recurrent, anxiety state    Axis II: No diagnosis    Jacqueline Breeze, LCSW 05/03/2021

## 2021-05-04 ENCOUNTER — Ambulatory Visit (INDEPENDENT_AMBULATORY_CARE_PROVIDER_SITE_OTHER): Payer: 59 | Admitting: Family Medicine

## 2021-05-04 ENCOUNTER — Encounter (INDEPENDENT_AMBULATORY_CARE_PROVIDER_SITE_OTHER): Payer: Self-pay | Admitting: Family Medicine

## 2021-05-04 ENCOUNTER — Other Ambulatory Visit: Payer: Self-pay

## 2021-05-04 VITALS — BP 115/81 | HR 91 | Temp 97.8°F | Ht 62.0 in | Wt 370.0 lb

## 2021-05-04 DIAGNOSIS — G4733 Obstructive sleep apnea (adult) (pediatric): Secondary | ICD-10-CM | POA: Diagnosis not present

## 2021-05-04 DIAGNOSIS — F324 Major depressive disorder, single episode, in partial remission: Secondary | ICD-10-CM

## 2021-05-04 DIAGNOSIS — R7303 Prediabetes: Secondary | ICD-10-CM

## 2021-05-04 DIAGNOSIS — Z6841 Body Mass Index (BMI) 40.0 and over, adult: Secondary | ICD-10-CM

## 2021-05-04 MED ORDER — TIRZEPATIDE 5 MG/0.5ML ~~LOC~~ SOAJ: 5.0000 mg | SUBCUTANEOUS | 2 refills | Status: DC

## 2021-05-04 NOTE — Progress Notes (Signed)
Chief Complaint:   OBESITY Jacqueline Collins is here to discuss her progress with her obesity treatment plan along with follow-up of her obesity related diagnoses. See Medical Weight Management Flowsheet for complete bioelectrical impedance results.  Today's visit was #: 10 Starting weight: 391 lbs Starting date: 01/13/2020 Weight change since last visit: 10 lbs Total lbs lost to date: 21 lbs Total weight loss percentage to date: -5.37%  Nutrition Plan: Practicing portion control and making smarter food choices, such as increasing vegetables and decreasing simple carbohydrates for 0% of the time. Activity: Increased activity. Anti-obesity medications: Mounjaro 2.5 mg subcutaneously weekly. Reported side effects: None.  Interim History: Jacqueline Collins says that her Disability finally came through.  She applied for an Jacqueline Collins.  She loves her new therapist.  She will get a CPAP in the next week or 2.  She did not qualify for [employer] reduced/no fee.  She has decided not to pursue bariatric surgery since she is doing well with Mounjaro.  Assessment/Plan:   1. Prediabetes, with polyphagia Improving, but not optimized. Goal is HgbA1c < 5.7.  Medication: Mounjaro 2.5 mg subcutaneously weekly.    Plan: She will continue to focus on protein-rich, low simple carbohydrate foods. We reviewed the importance of hydration, regular exercise for stress reduction, and restorative sleep.   Lab Results  Component Value Date   HGBA1C 6.1 (H) 02/23/2021   - Increase tirzepatide (MOUNJARO) 5 MG/0.5ML Pen; Inject 5 mg into the skin once a week.  Dispense: 6 mL; Refill: 2  2. OSA (obstructive sleep apnea Goal: Treatment of OSA via CPAP compliance and weight loss. Plasma ghrelin levels (appetite or "hunger hormone") are significantly higher in OSA patients than in BMI-matched controls, but decrease to levels similar to those of obese patients without OSA after CPAP treatment.  Weight loss improves OSA by several  mechanisms, including reduction in fatty tissue in the throat (i.e. parapharyngeal fat) and the tongue. Loss of abdominal fat increases mediastinal traction on the upper airway making it less likely to collapse during sleep. Studies have also shown that compliance with CPAP treatment improves leptin (hunger inhibitory hormone) imbalance.  3. Major depressive disorder with single episode, in partial remission (HCC) Jacqueline Collins is taking Prozac 20 mg daily.  She is currently doing well.  4. Obesity, current BMI 67.7  Course: Jacqueline Collins is currently in the action stage of change. As such, her goal is to continue with weight loss efforts.   Nutrition goals: She has agreed to practicing portion control and making smarter food choices, such as increasing vegetables and decreasing simple carbohydrates.   Exercise goals:  As is.  Behavioral modification strategies: increasing lean protein intake, decreasing simple carbohydrates, increasing vegetables, and increasing water intake.  Jacqueline Collins has agreed to follow-up with our clinic in 4 weeks. She was informed of the importance of frequent follow-up visits to maximize her success with intensive lifestyle modifications for her multiple health conditions.   Objective:   Blood pressure 115/81, pulse 91, temperature 97.8 F (36.6 C), temperature source Oral, height 5\' 2"  (1.575 m), weight (!) 370 lb (167.8 kg), SpO2 96 %. Body mass index is 67.67 kg/m.  General: Cooperative, alert, well developed, in no acute distress. HEENT: Conjunctivae and lids unremarkable. Cardiovascular: Regular rhythm.  Lungs: Normal work of breathing. Neurologic: No focal deficits.   Lab Results  Component Value Date   CREATININE 0.91 02/23/2021   BUN 10 02/23/2021   NA 139 02/23/2021   K 4.6 02/23/2021   CL  103 02/23/2021   CO2 20 02/23/2021   Lab Results  Component Value Date   ALT 17 02/23/2021   AST 11 02/23/2021   ALKPHOS 109 02/23/2021   BILITOT 0.3 02/23/2021    Lab Results  Component Value Date   HGBA1C 6.1 (H) 02/23/2021   HGBA1C 5.9 (A) 01/06/2020   HGBA1C 5.9 01/06/2020   HGBA1C 5.9 01/06/2020   HGBA1C 5.9 01/06/2020   Lab Results  Component Value Date   TSH 3.140 02/23/2021   Lab Results  Component Value Date   CHOL 212 (H) 02/23/2021   HDL 42 02/23/2021   LDLCALC 149 (H) 02/23/2021   TRIG 117 02/23/2021   CHOLHDL 5.0 (H) 02/23/2021   Lab Results  Component Value Date   VD25OH 15.8 (L) 02/23/2021   VD25OH 14.2 (L) 01/13/2020   Lab Results  Component Value Date   WBC 11.2 (H) 02/23/2021   HGB 13.1 02/23/2021   HCT 38.4 02/23/2021   MCV 91 02/23/2021   PLT 350 02/23/2021   Lab Results  Component Value Date   IRON 84 01/13/2020   TIBC 353 01/13/2020   FERRITIN 82 01/13/2020   Attestation Statements:   Reviewed by clinician on day of visit: allergies, medications, problem list, medical history, surgical history, family history, social history, and previous encounter notes.  Time spent on visit including pre-visit chart review and post-visit care and documentation was 46 minutes. Time was spent on: preparing to see the patient (eg, review of tests), obtaining and/or reviewing separately obtained history, performing a medically necessary appropriate examination and/or evaluation, counseling and educating the patient/family/caregiver, and documenting clinical information in the electronic or other health.   I, Insurance claims handler, CMA, am acting as transcriptionist for Helane Rima, DO  I have reviewed the above documentation for accuracy and completeness, and I agree with the above. -  Helane Rima, DO, MS, FAAFP, DABOM - Family and Bariatric Medicine.

## 2021-05-06 ENCOUNTER — Emergency Department (HOSPITAL_COMMUNITY)
Admission: EM | Admit: 2021-05-06 | Discharge: 2021-05-06 | Disposition: A | Discharge status: Home / Self Care | Payer: 59 | Attending: Emergency Medicine | Admitting: Emergency Medicine

## 2021-05-06 ENCOUNTER — Emergency Department (HOSPITAL_COMMUNITY): Payer: 59

## 2021-05-06 ENCOUNTER — Other Ambulatory Visit: Payer: Self-pay

## 2021-05-06 ENCOUNTER — Ambulatory Visit
Admission: RE | Admit: 2021-05-06 | Discharge: 2021-05-06 | Disposition: A | Discharge status: Home / Self Care | Payer: 59 | Source: Ambulatory Visit | Attending: Adult Health | Admitting: Adult Health

## 2021-05-06 ENCOUNTER — Encounter (HOSPITAL_COMMUNITY): Payer: Self-pay | Admitting: *Deleted

## 2021-05-06 DIAGNOSIS — Z79899 Other long term (current) drug therapy: Secondary | ICD-10-CM | POA: Diagnosis not present

## 2021-05-06 DIAGNOSIS — J45909 Unspecified asthma, uncomplicated: Secondary | ICD-10-CM | POA: Insufficient documentation

## 2021-05-06 DIAGNOSIS — S99912A Unspecified injury of left ankle, initial encounter: Secondary | ICD-10-CM | POA: Diagnosis present

## 2021-05-06 DIAGNOSIS — S93402A Sprain of unspecified ligament of left ankle, initial encounter: Secondary | ICD-10-CM | POA: Insufficient documentation

## 2021-05-06 DIAGNOSIS — X501XXA Overexertion from prolonged static or awkward postures, initial encounter: Secondary | ICD-10-CM | POA: Diagnosis not present

## 2021-05-06 DIAGNOSIS — N926 Irregular menstruation, unspecified: Secondary | ICD-10-CM | POA: Insufficient documentation

## 2021-05-06 DIAGNOSIS — I10 Essential (primary) hypertension: Secondary | ICD-10-CM | POA: Diagnosis not present

## 2021-05-06 DIAGNOSIS — Z87891 Personal history of nicotine dependence: Secondary | ICD-10-CM | POA: Diagnosis not present

## 2021-05-06 MED ORDER — NAPROXEN 500 MG PO TABS: 500.0000 mg | ORAL_TABLET | Freq: Two times a day (BID) | ORAL | 0 refills | Status: DC
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
## 2021-05-06 NOTE — ED Notes (Signed)
Pt willing to get crutches at this time.  No bariatric crutches available in the dept.  AC called for some and not able find any.

## 2021-05-06 NOTE — Discharge Instructions (Signed)
It looks like you have a sprain of your ankle, there may be a very tiny chip off one of the bones of your foot but it is difficult to tell.  Either way please take the anti-inflammatory called Naprosyn twice a day, use ice and elevate your ankle, try to minimize how much you are walking on it but if you need to walk on it is not a big deal.  I have offered you crutches but you have declined.  You may return to the ER for severe worsening symptoms otherwise call the orthopedic surgeon above to follow-up within the week

## 2021-05-06 NOTE — ED Triage Notes (Signed)
Pt twisted her left ankle earlier today and fell, denies hitting her head.

## 2021-05-10 ENCOUNTER — Ambulatory Visit (HOSPITAL_COMMUNITY): Payer: 59 | Admitting: Licensed Clinical Social Worker

## 2021-05-13 ENCOUNTER — Other Ambulatory Visit: Payer: Self-pay

## 2021-05-13 ENCOUNTER — Encounter: Payer: Self-pay | Admitting: Internal Medicine

## 2021-05-13 ENCOUNTER — Ambulatory Visit (INDEPENDENT_AMBULATORY_CARE_PROVIDER_SITE_OTHER): Payer: 59

## 2021-05-13 ENCOUNTER — Ambulatory Visit: Payer: 59 | Admitting: Internal Medicine

## 2021-05-13 DIAGNOSIS — R059 Cough, unspecified: Secondary | ICD-10-CM | POA: Diagnosis not present

## 2021-05-13 DIAGNOSIS — J069 Acute upper respiratory infection, unspecified: Secondary | ICD-10-CM

## 2021-05-13 DIAGNOSIS — Z20822 Contact with and (suspected) exposure to covid-19: Secondary | ICD-10-CM | POA: Diagnosis not present

## 2021-05-13 LAB — POCT INFLUENZA A/B
Influenza A, POC: NEGATIVE
Influenza B, POC: NEGATIVE
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
## 2021-05-15 LAB — NOVEL CORONAVIRUS, NAA: SARS-CoV-2, NAA: NOT DETECTED

## 2021-05-15 LAB — SARS-COV-2, NAA 2 DAY TAT

## 2021-05-17 ENCOUNTER — Telehealth: Payer: Self-pay | Admitting: Neurology

## 2021-05-17 ENCOUNTER — Other Ambulatory Visit: Payer: Self-pay | Admitting: Neurology

## 2021-05-17 ENCOUNTER — Encounter: Payer: Self-pay | Admitting: Neurology

## 2021-05-17 DIAGNOSIS — G4733 Obstructive sleep apnea (adult) (pediatric): Secondary | ICD-10-CM

## 2021-05-17 NOTE — Telephone Encounter (Signed)
Called the patient back to advise we can lower the minimum pressure to 5 cm water pressure. She is concerned that when she started the machine she developed a horrible sinus/allergies called and has not been able to use the machine.  Advised the machine should not cause that but that she should make sure she is cleaning the machine well and restart when she has completed her antibiotics.  Advised the patient that once she does start the machine back up, she needs to give [place] a good solid 10 days of data before stopping usage of the machine.  This will allow [place] to be able to gather more information to determine if any adjustments are needed machine.  Advised that she would have to take her machine into the company for them to make the pressure adjustment at which point they should also educate her on downloading the app for the machine so that she can upload her data.  Patient verbalized understanding and was agreeable to this plan.

## 2021-05-17 NOTE — Telephone Encounter (Signed)
Pt returned call. Please call back. She states she did reach out to [employer] but they instructed her to call us to adjust her pressure on the machine.

## 2021-05-17 NOTE — Telephone Encounter (Signed)
Pt called states she has some questions about her CPAP machine. Pt requesting requesting a call back.

## 2021-05-17 NOTE — Telephone Encounter (Signed)
Attempted to call the pt back, there was no answer.  When we reviewed the patient's data to her machine it indicates only 1 days of use.  We are unable to determine making any pressure changes based off of 1 days use.  Her CPAP is currently set at an auto range which means there is a minimum pressure setting and a maximum pressure setting. If the patient has used it nightly since getting it, we can arrange for her to bring the machine in for her to have a download complete or she can download from the app on her phone.  This will allow [place] to look at all the days and determine what adjustments would need to be made with more data. I will wait to hear back from patient.

## 2021-05-17 NOTE — Telephone Encounter (Signed)
Tried calling patient back, mailbox full, unable to leave message..   Reviewed last OV note w/ AL,NP. Looks like [employer] could not reach pt to set her up w/ cpap previously. Amy instructed her to contact [employer] at last appt to see about getting set up. If she still has not, she needs to call them at [phone]

## 2021-05-18 NOTE — Telephone Encounter (Signed)
CM sent to [facility] 

## 2021-05-23 ENCOUNTER — Other Ambulatory Visit: Payer: Self-pay | Admitting: *Deleted

## 2021-05-23 DIAGNOSIS — E876 Hypokalemia: Secondary | ICD-10-CM

## 2021-05-23 MED ORDER — POTASSIUM CHLORIDE CRYS ER 20 MEQ PO TBCR: 20.0000 meq | EXTENDED_RELEASE_TABLET | Freq: Every day | ORAL | 3 refills | Status: DC

## 2021-05-31 ENCOUNTER — Other Ambulatory Visit: Payer: Self-pay

## 2021-05-31 ENCOUNTER — Ambulatory Visit
Admission: EM | Admit: 2021-05-31 | Discharge: 2021-05-31 | Disposition: A | Discharge status: Home / Self Care | Payer: 59

## 2021-05-31 ENCOUNTER — Encounter: Payer: Self-pay | Admitting: Emergency Medicine

## 2021-05-31 DIAGNOSIS — U071 COVID-19: Secondary | ICD-10-CM | POA: Diagnosis not present

## 2021-05-31 DIAGNOSIS — J4520 Mild intermittent asthma, uncomplicated: Secondary | ICD-10-CM

## 2021-05-31 DIAGNOSIS — J4521 Mild intermittent asthma with (acute) exacerbation: Secondary | ICD-10-CM | POA: Diagnosis not present

## 2021-05-31 MED ORDER — PREDNISONE 20 MG PO TABS: 40.0000 mg | ORAL_TABLET | Freq: Every day | ORAL | 0 refills | Status: AC

## 2021-05-31 MED ORDER — ALBUTEROL SULFATE HFA 108 (90 BASE) MCG/ACT IN AERS: 1.0000 | INHALATION_SPRAY | Freq: Four times a day (QID) | RESPIRATORY_TRACT | 0 refills | Status: DC | PRN
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
## 2021-05-31 NOTE — Discharge Instructions (Addendum)
-  Prednisone, 2 pills taken at the same time for 5 days in a row.  Try taking this earlier in the day as it can give you energy. Avoid NSAIDs like ibuprofen and alleve while taking this medication as they can increase your risk of stomach upset and even GI bleeding when in combination with a steroid. You can continue tylenol (acetaminophen) up to 1000mg  3x daily. -Albuterol inhaler as needed for cough, wheezing, shortness of breath, 1 to 2 puffs every 6 hours as needed. -Continue over-the-counter medications

## 2021-05-31 NOTE — ED Triage Notes (Addendum)
Complains of headache, cough, runny nose, loss of taste, sob, chills and congested.  Patient took a home test and it was positive.  Patient's symptoms started 1 week ago.

## 2021-06-08 ENCOUNTER — Ambulatory Visit (INDEPENDENT_AMBULATORY_CARE_PROVIDER_SITE_OTHER): Payer: 59 | Admitting: Family Medicine

## 2021-06-13 NOTE — Patient Instructions (Incomplete)
Below is our plan:  We will ***  Please make sure you are staying well hydrated. I recommend 50-60 ounces daily. Well balanced diet and regular exercise encouraged. Consistent sleep schedule with 6-8 hours recommended.   Please continue follow up with care team as directed.   Please continue using your CPAP regularly. While your insurance requires that you use CPAP at least 4 hours each night on 70% of the nights, I recommend, that you not skip any nights and use it throughout the night if you can. Getting used to CPAP and staying with the treatment long term does take time and patience and discipline. Untreated obstructive sleep apnea when it is moderate to severe can have an adverse impact on cardiovascular health and raise her risk for heart disease, arrhythmias, hypertension, congestive heart failure, stroke and diabetes. Untreated obstructive sleep apnea causes sleep disruption, nonrestorative sleep, and sleep deprivation. This can have an impact on your day to day functioning and cause daytime sleepiness and impairment of cognitive function, memory loss, mood disturbance, and problems focussing. Using CPAP regularly can improve these symptoms.   Follow up with *** in ***  You may receive a survey regarding today's visit. I encourage you to leave honest feed back as I do use this information to improve patient care. Thank you for seeing me today!
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
## 2021-06-14 ENCOUNTER — Ambulatory Visit: Payer: 59 | Admitting: Family Medicine

## 2021-06-14 DIAGNOSIS — G4733 Obstructive sleep apnea (adult) (pediatric): Secondary | ICD-10-CM

## 2021-06-14 DIAGNOSIS — G43709 Chronic migraine without aura, not intractable, without status migrainosus: Secondary | ICD-10-CM

## 2021-07-06 ENCOUNTER — Other Ambulatory Visit: Payer: Self-pay

## 2021-07-06 ENCOUNTER — Encounter: Payer: Self-pay | Admitting: Gastroenterology

## 2021-07-06 ENCOUNTER — Ambulatory Visit: Payer: 59 | Admitting: Gastroenterology

## 2021-07-06 VITALS — BP 126/83 | HR 78 | Temp 97.2°F | Ht 62.0 in | Wt 384.2 lb

## 2021-07-06 DIAGNOSIS — K5904 Chronic idiopathic constipation: Secondary | ICD-10-CM | POA: Diagnosis not present

## 2021-07-06 DIAGNOSIS — M545 Low back pain, unspecified: Secondary | ICD-10-CM | POA: Insufficient documentation

## 2021-07-06 DIAGNOSIS — G8929 Other chronic pain: Secondary | ICD-10-CM

## 2021-07-06 DIAGNOSIS — R1084 Generalized abdominal pain: Secondary | ICD-10-CM

## 2021-07-06 DIAGNOSIS — R109 Unspecified abdominal pain: Secondary | ICD-10-CM | POA: Insufficient documentation

## 2021-07-06 MED ORDER — TRULANCE 3 MG PO TABS: 3.0000 mg | ORAL_TABLET | Freq: Every day | ORAL | 0 refills | Status: DC

## 2021-07-06 NOTE — Patient Instructions (Addendum)
For constipation, start Trulance 3mg  daily. Samples provided. Send me a mychart message in 10-14 days and let me know how medication works and if your abdominal pain or back pain are improved. Call sooner if medication not helping you have a regular bowel movement.  Send mychart message with name of vitamins you are taking. Drink plenty of water to help your bowels. Try to drink 100 ounces per day. Eat plenty of fruit and veggies. You should try to eat at least five servings per day.   Chronic Constipation Chronic constipation is a condition in which a person has three or fewer bowel movements a week, for 3 months or longer. This condition is especially common in older adults. What are the causes? Causes of chronic constipation may include: Not drinking enough fluid, eating enough food or fiber, or getting enough physical activity. Pregnancy. A tear in the anus (anal fissure). Blockage in the bowel (bowel obstruction). Narrowing of the bowel (bowel stricture). Having a long-term medical condition, such as: Diabetes, hypothyroidism, or iron-deficiency anemia. Stroke or spinal cord injury. Multiple sclerosis or Parkinson's disease. Colon cancer. Dementia. Inflammatory bowel disease (IBD), outward collapse of the rectum (rectal prolapse), or hemorrhoids. Taking certain medicines, including: Narcotics. These are a certain type of prescription pain medicine. Antacids or iron supplements. Water pills (diuretics). Certain blood pressure medicines. Anti-seizure medicines. Antidepressants. Medicines for Parkinson's disease. Other causes of this condition may include: Stress. Problems in the nerves and muscles that control the movement of stool. Weak or impaired pelvic floor muscles. What increases the risk? You may be at higher risk for chronic constipation if: You are older than age 44. You are female. You live in a long-term care facility. You have a long-term disease. You have a  mental health disorder or eating disorder. What are the signs or symptoms? The main symptom of chronic constipation is having three or fewer bowel movements a week for several weeks. Other signs and symptoms may vary from person to person. These include: Pushing hard (straining) to pass stool, or having hard or lumpy stools. Painful bowel movements. Having lower abdominal discomfort, such as cramps or bloating. Being unable to have a bowel movement when you feel the urge, or feeling like you still need to pass stool after a bowel movement. Feeling that you have something in your rectum that is blocking or preventing bowel movements. Seeing blood on the toilet paper or in your stool. Worsening confusion (in older adults). How is this diagnosed? This condition may be diagnosed based on: Your symptoms and medical history. You will be asked about your symptoms, lifestyle, diet, and any medicines that you are taking. A physical exam. Your abdomen will be examined. A digital rectal exam may be done. For this exam, a health care provider places a lubricated, gloved finger into the rectum. Tests to check for any underlying causes of your constipation. These may be ordered if you have bleeding in your rectum, weight loss, or a family history of colon cancer. In these cases, you may have: Imaging studies of the colon. These may include X-ray, ultrasound, or a CT scan. Blood tests. A procedure to examine the inside of your colon (colonoscopy). More specialized tests to check: Whether your anal sphincter works well. This is a ring-shaped muscle that controls the closing of the anus. How well food moves through your colon. Tests to measure the nerve signal in your pelvic floor muscles (electromyography). How is this treated? Treatment for chronic constipation depends on the cause.  Most often, treatment starts with: Being more active and getting regular exercise. Drinking more fluids. Adding fiber to  your diet. Sources of fiber include fruits, vegetables, whole grains, and fiber supplements. Using medicines such as stool softeners or medicines that increase contractions in your digestive system (pro-motility agents). Training your pelvic muscles with biofeedback. Surgery, if there is obstruction. Treatment may also include: Stopping or changing some medicines if they cause constipation. Using a fiber supplement (bulk laxative) or stool softener. Using a prescription laxative. This works by [employer] into your colon (osmotic laxative). You may also need to see a specialist who treats conditions of the digestive system (gastroenterologist). Follow these instructions at home: Medicines Take over-the-counter and prescription medicines only as told by your health care provider. If you are taking a laxative, take it as told by your health care provider. Eating and drinking  Eat a balanced diet that includes enough fiber. Ask your health care provider to recommend a diet that is right for you. Drink clear fluids, especially water. Avoid drinking alcohol, caffeine, and soda. These can make constipation worse. Drink enough fluid to keep your urine pale yellow. General instructions Get some physical activity every day. Ask your health care provider what activities are safe for you. Get colon cancer screenings as told by your health care provider. Keep all follow-up visits as told by your health care provider. This is important. Contact a health care provider if you have: Three or fewer bowel movements a week. Stools that are hard or lumpy. Blood on the toilet paper or in your stool after you have a bowel movement. Unexplained weight loss. Rectum (rectal) pain. Stool leakage. Nausea or vomiting. Get help right away if you have: Rectal bleeding or you pass blood clots. Severe rectal pain. Body tissue that pushes out (protrudes) from your anus. Severe pain or bloating (distension) in  your abdomen. Vomiting that you cannot control. Summary Chronic constipation is a condition in which a person has three or fewer bowel movements a week, for 3 months or longer. You may have a higher risk for this condition if you are an older adult, you are female, or you have a long-term disease. Treatment for this condition depends on the cause. Most treatments for chronic constipation include adding fiber to your diet, drinking more fluids, and getting more physical activity. You may also need to treat any underlying medical conditions or stop or change certain medicines if they cause constipation. If lifestyle changes do not relieve constipation, your health care provider may recommend taking a laxative. This information is not intended to replace advice given to you by your health care provider. Make sure you discuss any questions you have with your health care provider. Document Revised: 05/07/2019 Document Reviewed: 05/07/2019 Elsevier Patient Education  [place].
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
## 2021-07-07 ENCOUNTER — Encounter: Payer: Self-pay | Admitting: Gastroenterology

## 2021-07-10 ENCOUNTER — Other Ambulatory Visit: Payer: Self-pay | Admitting: Gastroenterology

## 2021-07-10 NOTE — Progress Notes (Signed)
Patient also taking probiotic and Renew Life Cleanse More.

## 2021-07-20 ENCOUNTER — Telehealth: Payer: Self-pay

## 2021-07-20 ENCOUNTER — Encounter: Payer: Self-pay | Admitting: Adult Health

## 2021-07-20 ENCOUNTER — Ambulatory Visit: Payer: 59 | Admitting: Adult Health

## 2021-07-20 ENCOUNTER — Ambulatory Visit (INDEPENDENT_AMBULATORY_CARE_PROVIDER_SITE_OTHER): Payer: 59 | Admitting: Family Medicine

## 2021-07-20 NOTE — Telephone Encounter (Signed)
Contacted pt regarding today's appt, VM full. Attempted to get CPAP data downloaded and looks like pt is incompliant, wanted to see if she was using machine if so to bring it in with her, if not we will need to reschedule as we will need at least 30 days of usage on machine. Will also send pt mychart message

## 2021-07-27 ENCOUNTER — Encounter (INDEPENDENT_AMBULATORY_CARE_PROVIDER_SITE_OTHER): Payer: Self-pay | Admitting: Family Medicine

## 2021-07-27 DIAGNOSIS — R7303 Prediabetes: Secondary | ICD-10-CM

## 2021-07-27 NOTE — Telephone Encounter (Signed)
Dr.Wallace °

## 2021-07-28 MED ORDER — TIRZEPATIDE 2.5 MG/0.5ML ~~LOC~~ SOAJ: 2.5000 mg | SUBCUTANEOUS | 0 refills | Status: DC

## 2021-08-10 ENCOUNTER — Ambulatory Visit (INDEPENDENT_AMBULATORY_CARE_PROVIDER_SITE_OTHER): Payer: 59 | Admitting: Family Medicine

## 2021-08-15 ENCOUNTER — Encounter: Payer: Self-pay | Admitting: Internal Medicine

## 2021-08-15 ENCOUNTER — Ambulatory Visit (INDEPENDENT_AMBULATORY_CARE_PROVIDER_SITE_OTHER): Payer: 59 | Admitting: Internal Medicine

## 2021-08-15 ENCOUNTER — Other Ambulatory Visit: Payer: Self-pay

## 2021-08-15 VITALS — BP 132/86 | HR 95 | Resp 18 | Ht 61.0 in | Wt 381.0 lb

## 2021-08-15 DIAGNOSIS — F321 Major depressive disorder, single episode, moderate: Secondary | ICD-10-CM

## 2021-08-15 DIAGNOSIS — Z2821 Immunization not carried out because of patient refusal: Secondary | ICD-10-CM | POA: Diagnosis not present

## 2021-08-15 DIAGNOSIS — E559 Vitamin D deficiency, unspecified: Secondary | ICD-10-CM

## 2021-08-15 DIAGNOSIS — M545 Low back pain, unspecified: Secondary | ICD-10-CM

## 2021-08-15 DIAGNOSIS — E662 Morbid (severe) obesity with alveolar hypoventilation: Secondary | ICD-10-CM

## 2021-08-15 DIAGNOSIS — E782 Mixed hyperlipidemia: Secondary | ICD-10-CM

## 2021-08-15 DIAGNOSIS — G8929 Other chronic pain: Secondary | ICD-10-CM

## 2021-08-15 DIAGNOSIS — K5904 Chronic idiopathic constipation: Secondary | ICD-10-CM

## 2021-08-15 DIAGNOSIS — Z0001 Encounter for general adult medical examination with abnormal findings: Secondary | ICD-10-CM | POA: Diagnosis not present

## 2021-08-15 DIAGNOSIS — M25572 Pain in left ankle and joints of left foot: Secondary | ICD-10-CM

## 2021-08-15 MED ORDER — CYCLOBENZAPRINE HCL 10 MG PO TABS: 10.0000 mg | ORAL_TABLET | Freq: Three times a day (TID) | ORAL | 2 refills | Status: DC | PRN

## 2021-08-15 MED ORDER — ROSUVASTATIN CALCIUM 10 MG PO TABS: 10.0000 mg | ORAL_TABLET | Freq: Every day | ORAL | 1 refills | Status: DC
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
## 2021-08-15 NOTE — Patient Instructions (Signed)
Please continue to follow low carb diet and perform moderate exercise as tolerated.  Please take Flexeril as needed for muscle spasm. Okay to apply heating pad for back pain.  You are being referred to [facility] therapist.  You are being referred to Podiatrist for left ankle pain and swelling.

## 2021-08-16 ENCOUNTER — Telehealth: Payer: Self-pay | Admitting: *Deleted

## 2021-08-16 LAB — CBC WITH DIFFERENTIAL/PLATELET
Basophils Absolute: 0 10*3/uL (ref 0.0–0.2)
Basos: 0 %
EOS (ABSOLUTE): 0.2 10*3/uL (ref 0.0–0.4)
Eos: 2 %
Hematocrit: 40.5 % (ref 34.0–46.6)
Hemoglobin: 13.6 g/dL (ref 11.1–15.9)
Immature Grans (Abs): 0 10*3/uL (ref 0.0–0.1)
Immature Granulocytes: 0 %
Lymphocytes Absolute: 2.1 10*3/uL (ref 0.7–3.1)
Lymphs: 26 %
MCH: 31.6 pg (ref 26.6–33.0)
MCHC: 33.6 g/dL (ref 31.5–35.7)
MCV: 94 fL (ref 79–97)
Monocytes Absolute: 0.3 10*3/uL (ref 0.1–0.9)
Monocytes: 4 %
Neutrophils Absolute: 5.4 10*3/uL (ref 1.4–7.0)
Neutrophils: 68 %
Platelets: 369 10*3/uL (ref 150–450)
RBC: 4.3 x10E6/uL (ref 3.77–5.28)
RDW: 13.2 % (ref 11.7–15.4)
WBC: 8 10*3/uL (ref 3.4–10.8)

## 2021-08-16 LAB — CMP14+EGFR
ALT: 10 IU/L (ref 0–32)
AST: 15 IU/L (ref 0–40)
Albumin/Globulin Ratio: 1.5 (ref 1.2–2.2)
Albumin: 4.3 g/dL (ref 3.8–4.8)
Alkaline Phosphatase: 103 IU/L (ref 44–121)
BUN/Creatinine Ratio: 11 (ref 9–23)
BUN: 10 mg/dL (ref 6–20)
Bilirubin Total: 0.6 mg/dL (ref 0.0–1.2)
CO2: 23 mmol/L (ref 20–29)
Calcium: 9.3 mg/dL (ref 8.7–10.2)
Chloride: 101 mmol/L (ref 96–106)
Creatinine, Ser: 0.92 mg/dL (ref 0.57–1.00)
Globulin, Total: 2.8 g/dL (ref 1.5–4.5)
Glucose: 106 mg/dL — ABNORMAL HIGH (ref 70–99)
Potassium: 4.2 mmol/L (ref 3.5–5.2)
Sodium: 138 mmol/L (ref 134–144)
Total Protein: 7.1 g/dL (ref 6.0–8.5)
eGFR: 84 mL/min/{1.73_m2} (ref >59)

## 2021-08-16 LAB — LIPID PANEL
Chol/HDL Ratio: 6.4 ratio — ABNORMAL HIGH (ref 0.0–4.4)
Cholesterol, Total: 187 mg/dL (ref 100–199)
HDL: 29 mg/dL — ABNORMAL LOW (ref >39)
LDL Chol Calc (NIH): 128 mg/dL — ABNORMAL HIGH (ref 0–99)
Triglycerides: 167 mg/dL — ABNORMAL HIGH (ref 0–149)
VLDL Cholesterol Cal: 30 mg/dL (ref 5–40)

## 2021-08-16 LAB — TSH: TSH: 3.34 u[IU]/mL (ref 0.450–4.500)

## 2021-08-16 LAB — HEMOGLOBIN A1C
Est. average glucose Bld gHb Est-mCnc: 114 mg/dL
Hgb A1c MFr Bld: 5.6 % (ref 4.8–5.6)

## 2021-08-16 LAB — VITAMIN D 25 HYDROXY (VIT D DEFICIENCY, FRACTURES): Vit D, 25-Hydroxy: 27.5 ng/mL — ABNORMAL LOW (ref 30.0–100.0)

## 2021-08-16 NOTE — Chronic Care Management (AMB) (Signed)
°  Care Management   Outreach Note  08/16/2021 Name: Jacqueline Collins MRN: [id number] DOB: 1986/02/14  Referred by: [name], MD Reason for referral : Care Coordination (Initial outreach to schedule with SW)   An unsuccessful telephone outreach was attempted today. The patient was referred to the case management team for assistance with care management and care coordination.   Follow Up Plan:  A HIPAA compliant phone message was left for the patient providing contact information and requesting a return call.  The care management team will reach out to the patient again over the next 7 days.  If patient returns call to provider office, please advise to call Embedded Care Management Care Guide [name] * at [phone].[name]  Care Guide, Embedded Care Coordination [facility] Management  Direct Dial: [phone]

## 2021-08-19 ENCOUNTER — Telehealth: Payer: Self-pay | Admitting: Internal Medicine

## 2021-08-19 NOTE — Assessment & Plan Note (Signed)
Needs to be compliant with Prozac Referred to [facility] therapy

## 2021-08-19 NOTE — Assessment & Plan Note (Signed)
Chronic low back pain, her morbid obesity makes her back pain worse Advised to avoid heavy lifting or frequent bending Her lack of mobility also worsens her stiffness of the back Flexeril as needed for now

## 2021-08-19 NOTE — Assessment & Plan Note (Signed)
Needs CPAP device Advised to contact Neurology office 

## 2021-08-19 NOTE — Assessment & Plan Note (Signed)
Physical exam as documented. Fasting blood test ordered.

## 2021-08-19 NOTE — Assessment & Plan Note (Addendum)
Followed by Medical weight loss clinic Also being evaluated by Bariatric surgery Was started on Mounjaro by weight loss clinic, advised to increase dose to 5 mg -she had constipation with it, advised to take stool softener if needed continue low carb diet

## 2021-08-19 NOTE — Telephone Encounter (Signed)
Pt called in requesting letter for physical and mental health , an not being able to work because of it.  Pt states spoke with provider in regard to this on 2/13   Pt wants a call back in regard

## 2021-08-19 NOTE — Assessment & Plan Note (Signed)
Takes Lactulose PRN Has persistent constipation Advised to use Colace PRN and prune juice

## 2021-08-22 ENCOUNTER — Telehealth: Payer: Self-pay | Admitting: Gastroenterology

## 2021-08-22 NOTE — Telephone Encounter (Signed)
PATIENT NEEDS TRULANCE PRESCRIPTION SENT TO [facility] IN [place]

## 2021-08-22 NOTE — Telephone Encounter (Signed)
LVM for pt to call the office.

## 2021-08-23 ENCOUNTER — Telehealth: Payer: Self-pay | Admitting: Gastroenterology

## 2021-08-23 ENCOUNTER — Ambulatory Visit: Payer: 59 | Admitting: Family Medicine

## 2021-08-23 ENCOUNTER — Encounter: Payer: Self-pay | Admitting: Family Medicine

## 2021-08-23 ENCOUNTER — Other Ambulatory Visit: Payer: Self-pay

## 2021-08-23 VITALS — BP 121/79 | HR 82 | Ht 61.0 in | Wt 377.0 lb

## 2021-08-23 DIAGNOSIS — G4733 Obstructive sleep apnea (adult) (pediatric): Secondary | ICD-10-CM | POA: Diagnosis not present

## 2021-08-23 DIAGNOSIS — G43709 Chronic migraine without aura, not intractable, without status migrainosus: Secondary | ICD-10-CM | POA: Diagnosis not present

## 2021-08-23 MED ORDER — UBRELVY 100 MG PO TABS: 100.0000 mg | ORAL_TABLET | Freq: Every day | ORAL | 11 refills | Status: DC | PRN

## 2021-08-23 MED ORDER — TRULANCE 3 MG PO TABS: 3.0000 mg | ORAL_TABLET | Freq: Every day | ORAL | 3 refills | Status: DC

## 2021-08-23 MED ORDER — EMGALITY 120 MG/ML ~~LOC~~ SOAJ: 120.0000 mg | SUBCUTANEOUS | 3 refills | Status: DC

## 2021-08-23 NOTE — Progress Notes (Signed)
CM sent to [facility] for new order ?

## 2021-08-23 NOTE — Telephone Encounter (Signed)
Patient dropped off letter asking needs statement stating physically able to work or not.  No FMLA forms, just need letter explanation. Contact patient when ready to pick up [phone] or [phone]. letter in provider box.
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
## 2021-08-23 NOTE — Telephone Encounter (Signed)
Pt will drop off forms by the office and also given number to contact stacey will contact today

## 2021-08-23 NOTE — Telephone Encounter (Signed)
Addressed 2/21.

## 2021-08-23 NOTE — Addendum Note (Signed)
Addended by: Gelene Mink on: 08/23/2021 12:04 PM   Modules accepted: Orders

## 2021-08-23 NOTE — Telephone Encounter (Signed)
PATIENT CALLED FOR A REFILL ON HER TRULANCE

## 2021-08-23 NOTE — Patient Instructions (Signed)
Below is our plan:  We will restart Emgality injections every 30 days. Continue Ubrelvy as needed for abortive therapy. It is imperative that you manage sleep apnea. Headaches are unlikely to improve until apnea is better managed. I have called an order to [facility] to help with getting used to CPAP. Please follow up with PCP regarding neck pain.   Please continue using your CPAP regularly. While your insurance requires that you use CPAP at least 4 hours each night on 70% of the nights, I recommend, that you not skip any nights and use it throughout the night if you can. Getting used to CPAP and staying with the treatment long term does take time and patience and discipline. Untreated obstructive sleep apnea when it is moderate to severe can have an adverse impact on cardiovascular health and raise her risk for heart disease, arrhythmias, hypertension, congestive heart failure, stroke and diabetes. Untreated obstructive sleep apnea causes sleep disruption, nonrestorative sleep, and sleep deprivation. This can have an impact on your day to day functioning and cause daytime sleepiness and impairment of cognitive function, memory loss, mood disturbance, and problems focussing. Using CPAP regularly can improve these symptoms.  Please make sure you are staying well hydrated. I recommend 50-60 ounces daily. Well balanced diet and regular exercise encouraged. Consistent sleep schedule with 6-8 hours recommended.   Please continue follow up with care team as directed.   Follow up with me in 6 months   You may receive a survey regarding today's visit. I encourage you to leave honest feed back as I do use this information to improve patient care. Thank you for seeing me today!

## 2021-08-23 NOTE — Telephone Encounter (Signed)
Completed.

## 2021-08-23 NOTE — Telephone Encounter (Signed)
Per patient needs completed by 02.23.2023.

## 2021-08-23 NOTE — Chronic Care Management (AMB) (Signed)
°  Care Management   Note  08/23/2021 Name: Jacqueline Collins MRN: [id number] DOB: 1986-04-13  Jacqueline Collins is a 36 y.o. year old female who is a primary care patient of [name], MD. I reached out to [name] by phone today in response to a referral sent by [name]'s primary care provider.   [name] was given information about care management services today including:  Care management services include personalized support from designated clinical staff supervised by her physician, including individualized plan of care and coordination with other care providers 24/7 contact phone numbers for assistance for urgent and routine care needs. The patient may stop care management services at any time by phone call to the office staff.  Patient agreed to services and verbal consent obtained.   Follow up plan: Telephone appointment with care management team member scheduled for:10/07/21  [facility] Guide, Embedded Care Coordination [facility]   Care Management  Direct Dial: [phone]

## 2021-08-24 ENCOUNTER — Telehealth: Payer: Self-pay

## 2021-08-24 ENCOUNTER — Telehealth: Payer: Self-pay | Admitting: *Deleted

## 2021-08-24 ENCOUNTER — Telehealth: Payer: Self-pay | Admitting: Internal Medicine

## 2021-08-24 ENCOUNTER — Encounter: Payer: Self-pay | Admitting: *Deleted

## 2021-08-24 MED ORDER — TRULANCE 3 MG PO TABS: 3.0000 mg | ORAL_TABLET | Freq: Every day | ORAL | 3 refills | Status: DC

## 2021-08-24 NOTE — Telephone Encounter (Signed)
Pt said her pharmacy  hasn't received anything on her Trulance. [phone]

## 2021-08-24 NOTE — Telephone Encounter (Signed)
Spoke with pt and informed her that we are having to submit a PA for her Trulance and that it can take up to 48 hrs for a response from [employer]. The pt verbalized understanding.

## 2021-08-24 NOTE — Telephone Encounter (Signed)
Sent again

## 2021-08-24 NOTE — Addendum Note (Signed)
Addended by: Gelene Mink on: 08/24/2021 03:52 PM   Modules accepted: Orders

## 2021-08-24 NOTE — Telephone Encounter (Signed)
Pt's pharmacy is stating that they have not received the pt's trulance. Can you resend to [employer] [facility]. Thanks.

## 2021-08-24 NOTE — Telephone Encounter (Signed)
Submitted PA Ubrelvy on [facility]. Key: [id number] - PA Case ID: [id number] - Rx #: [id number]. Waiting on determination from [facility].

## 2021-08-25 ENCOUNTER — Other Ambulatory Visit: Payer: Self-pay | Admitting: Neurology

## 2021-08-25 MED ORDER — LINACLOTIDE 290 MCG PO CAPS: 290.0000 ug | ORAL_CAPSULE | Freq: Every day | ORAL | 11 refills | Status: DC

## 2021-08-25 MED ORDER — UBRELVY 100 MG PO TABS: 100.0000 mg | ORAL_TABLET | Freq: Every day | ORAL | 11 refills | Status: DC | PRN

## 2021-08-25 NOTE — Telephone Encounter (Signed)
The request for 10 UBRELVY TAB 100MG  per month is denied. This decision is based on health plan criteria for UBRELVY TAB 100MG . More than 8 tablets per month is covered only if: One of the following: (1) The drug is used for acute treatment of migraine if you have more than four migraines per month each requiring more than one dose. (2) You have more than eight migraines per month. The information provided does not show that you meet the criteria listed above. Reviewed by: , R.Ph.  Appeals can be faxed to: [phone]

## 2021-08-25 NOTE — Telephone Encounter (Signed)
Called patient she will come by and pick up the letter for work.

## 2021-08-25 NOTE — Addendum Note (Signed)
Addended by: Mahala Menghini on: 08/25/2021 04:09 PM   Modules accepted: Orders

## 2021-08-25 NOTE — Telephone Encounter (Signed)
Lmom notifying pt that medication was sent to pharmacy.

## 2021-08-25 NOTE — Telephone Encounter (Signed)
RX for Linzess sent to pharmacy.

## 2021-08-25 NOTE — Telephone Encounter (Signed)
I have corrected the script to be 8 tablet instead of 10 so that pt may fill it

## 2021-08-25 NOTE — Telephone Encounter (Signed)
PA for Trulance 3 mg was denied. Insurance is requiring patient to try and fail Linzess and Motegrity. Informed patient of decision and patient is willing to try whichever one would be the strongest and will work for her.

## 2021-08-29 ENCOUNTER — Ambulatory Visit: Payer: 59 | Admitting: Podiatry

## 2021-09-01 ENCOUNTER — Ambulatory Visit: Payer: 59 | Admitting: Podiatry

## 2021-09-05 ENCOUNTER — Encounter (INDEPENDENT_AMBULATORY_CARE_PROVIDER_SITE_OTHER): Payer: Self-pay | Admitting: Family Medicine

## 2021-09-05 ENCOUNTER — Encounter: Payer: Self-pay | Admitting: Internal Medicine

## 2021-09-05 ENCOUNTER — Encounter: Payer: Self-pay | Admitting: Family Medicine

## 2021-09-05 NOTE — Telephone Encounter (Signed)
Dr.Wallace °

## 2021-09-07 ENCOUNTER — Telehealth (INDEPENDENT_AMBULATORY_CARE_PROVIDER_SITE_OTHER): Payer: Self-pay | Admitting: Family Medicine

## 2021-09-07 NOTE — Telephone Encounter (Signed)
Pt sent mychart message and this has been forwarded to Dr Earlene Plater. ?

## 2021-09-07 NOTE — Telephone Encounter (Signed)
Patient says she is returning a call to Jacqueline Collins regarding information she need.  ?

## 2021-09-15 ENCOUNTER — Other Ambulatory Visit: Payer: Self-pay | Admitting: Internal Medicine

## 2021-09-15 ENCOUNTER — Encounter: Payer: Self-pay | Admitting: Internal Medicine

## 2021-09-15 DIAGNOSIS — E559 Vitamin D deficiency, unspecified: Secondary | ICD-10-CM

## 2021-09-16 ENCOUNTER — Telehealth: Payer: 59 | Admitting: Physician Assistant

## 2021-09-16 DIAGNOSIS — J349 Unspecified disorder of nose and nasal sinuses: Secondary | ICD-10-CM | POA: Diagnosis not present

## 2021-09-16 MED ORDER — FLUTICASONE PROPIONATE 50 MCG/ACT NA SUSP: 2.0000 | Freq: Every day | NASAL | 0 refills | Status: AC

## 2021-09-16 NOTE — Progress Notes (Signed)
E-Visit for Sinus Problems ? ?We are sorry that you are not feeling well.  ? ?You noted that you are having vision changes on your questionnaire. If you are truly having changes in your vision you need to seek evaluation for an in person visit as this is very concerning and is not a typical symptom of a sinus infection. ? ?If your symptoms are more consistent with itchy/watery eyes then this is more likely related to allergies rather than an emergent process. In regards to your other symptoms. It sounds like you have sinusitis.  Sinusitis is inflammation and infection in the sinus cavities of the head.  Based on your presentation I believe you most likely have Acute Viral Sinusitis.This is an infection most likely caused by a virus. There is not specific treatment for viral sinusitis other than to help you with the symptoms until the infection runs its course.  You may use an oral decongestant such as Mucinex D or if you have glaucoma or high blood pressure use plain Mucinex. Saline nasal spray help and can safely be used as often as needed for congestion, I have prescribed: Fluticasone nasal spray two sprays in each nostril once a day ? ?Some authorities believe that zinc sprays or the use of Echinacea may shorten the course of your symptoms. ? ?Sinus infections are not as easily transmitted as other respiratory infection, however we still recommend that you avoid close contact with loved ones, especially the very young and elderly.  Remember to wash your hands thoroughly throughout the day as this is the number one way to prevent the spread of infection! ? ?Home Care: ?Only take medications as instructed by your medical team. ?Do not take these medications with alcohol. ?A steam or ultrasonic humidifier can help congestion.  You can place a towel over your head and breathe in the steam from hot water coming from a faucet. ?Avoid close contacts especially the very young and the elderly. ?Cover your mouth when you  cough or sneeze. ?Always remember to wash your hands. ? ?Get Help Right Away If: ?You develop worsening fever or sinus pain. ?You develop a severe head ache or visual changes. ?Your symptoms persist after you have completed your treatment plan. ? ?Make sure you ?Understand these instructions. ?Will watch your condition. ?Will get help right away if you are not doing well or get worse. ? ? ?Thank you for choosing an e-visit. ? ?Your e-visit answers were reviewed by a board certified advanced clinical practitioner to complete your personal care plan. Depending upon the condition, your plan could have included both over the counter or prescription medications. ? ?Please review your pharmacy choice. Make sure the pharmacy is open so you can pick up prescription now. If there is a problem, you may contact your provider through [employer] and have the prescription routed to another pharmacy.  Your safety is important to [place]. If you have drug allergies check your prescription carefully.  ? ?For the next 24 hours you can use MyChart to ask questions about today's visit, request a non-urgent call back, or ask for a work or school excuse. ?You will get an email in the next two days asking about your experience. I hope that your e-visit has been valuable and will speed your recovery. ? ?Approximately 5 minutes was spent documenting and reviewing patient's chart. ? ?

## 2021-09-17 ENCOUNTER — Other Ambulatory Visit (INDEPENDENT_AMBULATORY_CARE_PROVIDER_SITE_OTHER): Payer: Self-pay | Admitting: Family Medicine

## 2021-09-17 DIAGNOSIS — F321 Major depressive disorder, single episode, moderate: Secondary | ICD-10-CM

## 2021-09-26 ENCOUNTER — Encounter (INDEPENDENT_AMBULATORY_CARE_PROVIDER_SITE_OTHER): Payer: Self-pay | Admitting: Family Medicine

## 2021-09-26 ENCOUNTER — Ambulatory Visit: Payer: 59 | Admitting: Family Medicine

## 2021-09-26 ENCOUNTER — Telehealth (INDEPENDENT_AMBULATORY_CARE_PROVIDER_SITE_OTHER): Payer: 59 | Admitting: Family Medicine

## 2021-09-26 ENCOUNTER — Other Ambulatory Visit: Payer: Self-pay

## 2021-09-26 DIAGNOSIS — G894 Chronic pain syndrome: Secondary | ICD-10-CM | POA: Diagnosis not present

## 2021-09-26 DIAGNOSIS — R7303 Prediabetes: Secondary | ICD-10-CM

## 2021-09-26 DIAGNOSIS — B9689 Other specified bacterial agents as the cause of diseases classified elsewhere: Secondary | ICD-10-CM

## 2021-09-26 DIAGNOSIS — G4733 Obstructive sleep apnea (adult) (pediatric): Secondary | ICD-10-CM | POA: Diagnosis not present

## 2021-09-26 DIAGNOSIS — E669 Obesity, unspecified: Secondary | ICD-10-CM

## 2021-09-26 DIAGNOSIS — J329 Chronic sinusitis, unspecified: Secondary | ICD-10-CM | POA: Diagnosis not present

## 2021-09-26 DIAGNOSIS — F3289 Other specified depressive episodes: Secondary | ICD-10-CM

## 2021-09-26 DIAGNOSIS — G43809 Other migraine, not intractable, without status migrainosus: Secondary | ICD-10-CM | POA: Diagnosis not present

## 2021-09-26 DIAGNOSIS — Z9989 Dependence on other enabling machines and devices: Secondary | ICD-10-CM

## 2021-09-26 DIAGNOSIS — Z6841 Body Mass Index (BMI) 40.0 and over, adult: Secondary | ICD-10-CM

## 2021-09-26 MED ORDER — TIRZEPATIDE 5 MG/0.5ML ~~LOC~~ SOAJ: 5.0000 mg | SUBCUTANEOUS | 3 refills | Status: DC

## 2021-09-26 MED ORDER — AMOXICILLIN 875 MG PO TABS: 875.0000 mg | ORAL_TABLET | Freq: Two times a day (BID) | ORAL | 0 refills | Status: AC

## 2021-09-27 ENCOUNTER — Telehealth: Payer: Self-pay | Admitting: *Deleted

## 2021-09-27 NOTE — Chronic Care Management (AMB) (Signed)
?  Care Management  ? ?Note ? ?09/27/2021 ?Name: Jacqueline Collins MRN: [id number] DOB: 1986/04/27 ? ?Jacqueline Collins is a 36 y.o. year old female who is a primary care patient of [name], MD and is actively engaged with the care management team. I reached out to [name] by phone today to assist with re-scheduling an initial visit with the Licensed Clinical Social Worker ? ?Follow up plan: ?Unsuccessful telephone outreach attempt made. The care management team will reach out to the patient again over the next 7 days. If patient returns call to provider office, please advise to call Embedded Care Management Care Guide [name] at [phone]. ? ?[name]  ?Care Guide, Embedded Care Coordination ?[place]  Care Management  ?Direct Dial: [phone] ? ?

## 2021-10-04 NOTE — Chronic Care Management (AMB) (Signed)
?  Care Management  ? ?Note ? ?10/04/2021 ?Name: Jacqueline Collins MRN: [id number] DOB: [birth year] ? ?Jacqueline Collins is a 36 y.o. year old female who is a primary care patient of [name], MD and is actively engaged with the care management team. I reached out to [name] by phone today to assist with re-scheduling an initial visit with the Licensed Clinical Social Worker ? ?Follow up plan: ?Telephone appointment with care management team member scheduled for:10/05/21 ? ?[name]  ?Care Guide, Embedded Care Coordination ?[facility]  ?Direct Dial: [phone] ? ?

## 2021-10-04 NOTE — Progress Notes (Signed)
? ? ?TeleHealth Visit:  ?Due to the COVID-19 pandemic, this visit was completed with telemedicine (audio/video) technology to reduce patient and provider exposure as well as to preserve personal protective equipment.  ? ?Jacqueline Collins has verbally consented to this TeleHealth visit. The patient is located at home, the provider is located at the [facility] office. The participants in this visit include the listed provider and patient. The visit was conducted today via MyChart video. ? ?OBESITY ?Jacqueline Collins is here to discuss her progress with her obesity treatment plan along with follow-up of her obesity related diagnoses.  ? ?Today's visit was #: 11 ?Starting weight: 391 lbs ?Starting date: 01/31/2020 ? ?Interim History: Jacqueline Collins needs a disability letter.  She will see her therapist next month. ? ?Nutrition Plan: practicing portion control and making smarter food choices, such as increasing vegetables and decreasing simple carbohydrates.  ?Anti-obesity medications: Mounjaro 5 mg subcutaneously weekly. Reported side effects: None. ? ?Assessment/Plan:  ? ?1. Bacterial sinusitis ?Sinus pressure and pain with purulent drainage, headache and PND for over 10 days. ? ?Start:  amoxicillin 875 mg twice daily for 10 days. ? ?- Start amoxicillin (AMOXIL) 875 MG tablet; Take 1 tablet (875 mg total) by mouth 2 (two) times daily for 10 days.  Dispense: 20 tablet; Refill: 0 ? ?2. OSA on CPAP ?CPAP needs to be adjusted.  The mask is too tight. ? ?3. Chronic pain syndrome ?Jacqueline Collins is taking Flexeril.  She has chest pain and left ankle pain (increased edema) along with neuralgia.  Previously lifting 50-70 pounds at work which is too much for her. ? ?4. Other migraine without status migrainosus, not intractable ?Severe.  Followed by Neurology.  ? ?5. Prediabetes, with polyphagia ?Goal is HgbA1c < 5.7.  Medication: Mounjaro 5 mg subcutaneously weekly.  She says this is helping. ? ?Plan:  Continue Mounjaro 5 mg subcutaneously weekly.  She will continue to focus on protein-rich, low simple carbohydrate foods. We reviewed the importance of hydration, regular exercise for stress reduction, and restorative sleep.  ? ?Lab Results  ?Component Value Date  ? HGBA1C 5.6 08/15/2021  ? ?- Refill tirzepatide (MOUNJARO) 5 MG/0.5ML Pen; Inject 5 mg into the skin once a week.  Dispense: 2 mL; Refill: 3 ? ?6. Other depression ?Uncontrolled.  Will meet with her therapist soon. Provided psychoeducation and facilitated discussion regarding eating as habit, self-monitoring, stimulus control, regulating eating pattern, coping skills, and barriers to success.  ? ?7. Obesity, current BMI 67.7 ? ?Jacqueline Collins is currently in the action stage of change. As such, her goal is to continue with weight loss efforts. She has agreed to practicing portion control and making smarter food choices, such as increasing vegetables and decreasing simple carbohydrates.  ? ?Exercise goals:  As is. ? ?Behavioral modification strategies: increasing lean protein intake, decreasing simple carbohydrates, increasing vegetables, and increasing water intake. ? ?Jacqueline Collins has agreed to follow-up with our clinic in 4 weeks. She was informed of the importance of frequent follow-up visits to maximize her success with intensive lifestyle modifications for her multiple health conditions. ? ?Objective:  ? ?VITALS: Per patient if applicable, see vitals. ?GENERAL: Alert and in no acute distress. ?CARDIOPULMONARY: No increased WOB. Speaking in clear sentences.  ?PSYCH: Pleasant and cooperative. Speech normal rate and rhythm. Affect is appropriate. Insight and judgement are appropriate. Attention is focused, linear, and appropriate.  ?NEURO: Oriented as arrived to appointment on time with no prompting.  ? ?Lab Results  ?Component Value Date  ? CREATININE 0.92 08/15/2021  ?  BUN 10 08/15/2021  ? NA 138 08/15/2021  ? K 4.2 08/15/2021  ? CL 101 08/15/2021  ? CO2 23 08/15/2021  ? ?Lab Results  ?Component Value Date  ?  ALT 10 08/15/2021  ? AST 15 08/15/2021  ? ALKPHOS 103 08/15/2021  ? BILITOT 0.6 08/15/2021  ? ?Lab Results  ?Component Value Date  ? HGBA1C 5.6 08/15/2021  ? HGBA1C 6.1 (H) 02/23/2021  ? HGBA1C 5.9 (A) 01/06/2020  ? HGBA1C 5.9 01/06/2020  ? HGBA1C 5.9 01/06/2020  ? HGBA1C 5.9 01/06/2020  ? ?Lab Results  ?Component Value Date  ? TSH 3.340 08/15/2021  ? ?Lab Results  ?Component Value Date  ? CHOL 187 08/15/2021  ? HDL 29 (L) 08/15/2021  ? LDLCALC 128 (H) 08/15/2021  ? TRIG 167 (H) 08/15/2021  ? CHOLHDL 6.4 (H) 08/15/2021  ? ?Lab Results  ?Component Value Date  ? WBC 8.0 08/15/2021  ? HGB 13.6 08/15/2021  ? HCT 40.5 08/15/2021  ? MCV 94 08/15/2021  ? PLT 369 08/15/2021  ? ?Lab Results  ?Component Value Date  ? IRON 84 01/13/2020  ? TIBC 353 01/13/2020  ? FERRITIN 82 01/13/2020  ? ?Attestation Statements:  ? ?Reviewed by clinician on day of visit: allergies, medications, problem list, medical history, surgical history, family history, social history, and previous encounter notes. ? ?Time spent on visit including pre-visit chart review and post-visit charting and care was 42 minutes.  ? ?I, Insurance claims handler, CMA, am acting as transcriptionist for [employer], DO ? ?I have reviewed the above documentation for accuracy and completeness, and I agree with the above. - Helane Rima, DO, MS, FAAFP, DABOM - Family and Bariatric Medicine.  ?

## 2021-10-05 ENCOUNTER — Ambulatory Visit: Payer: 59

## 2021-10-05 DIAGNOSIS — F3289 Other specified depressive episodes: Secondary | ICD-10-CM

## 2021-10-05 DIAGNOSIS — Z9989 Dependence on other enabling machines and devices: Secondary | ICD-10-CM

## 2021-10-05 DIAGNOSIS — G43809 Other migraine, not intractable, without status migrainosus: Secondary | ICD-10-CM

## 2021-10-05 DIAGNOSIS — E782 Mixed hyperlipidemia: Secondary | ICD-10-CM

## 2021-10-05 DIAGNOSIS — F418 Other specified anxiety disorders: Secondary | ICD-10-CM

## 2021-10-05 DIAGNOSIS — M545 Low back pain, unspecified: Secondary | ICD-10-CM

## 2021-10-05 DIAGNOSIS — K5909 Other constipation: Secondary | ICD-10-CM

## 2021-10-05 DIAGNOSIS — G8929 Other chronic pain: Secondary | ICD-10-CM

## 2021-10-05 NOTE — Chronic Care Management (AMB) (Signed)
Care Management ?Clinical Social Work Note ? ?10/05/2021 ?Name: Jacqueline Collins MRN: [id number] DOB: 11/30/1985 ? ?Jacqueline Collins is a 36 y.o. year old female who is a primary care patient of [name], MD.  The Care Management team was consulted for assistance with chronic disease management and care coordination needs. ? ?Jacqueline Collins spoke via phone today with Jacqueline Collins for Care Coordination call in response to provider referral for social work chronic care management and care coordination services ? ?Consent to Services:  ?Jacqueline Collins was given information about Care Management services today including:  ?Care Management services includes personalized support from designated clinical staff supervised by her physician, including individualized plan of care and coordination with other care providers ?24/7 contact phone numbers for assistance for urgent and routine care needs. ?The patient may stop case management services at any time by phone call to the office staff. ? ?Patient agreed to services and consent obtained.  ? ?Assessment: Review of patient past medical history, allergies, medications, and health status, including review of relevant consultants reports was performed today as part of a comprehensive evaluation and provision of chronic care management and care coordination services. ? ?SDOH (Social Determinants of Health) assessments and interventions performed:  ?SDOH Interventions   ? ?Flowsheet Row Most Recent Value  ?SDOH Interventions   ?Physical Activity Interventions Other (Comments)  [walking challenges. Gets short of breath when walking.  Has to take rest breaks when walking]  ?Stress Interventions Provide Counseling  [Jacqueline Collins has stress related to medical needs. Jacqueline Collins has stress related to financial needs]  ?Depression Interventions/Treatment  Counseling, Medication  ? ?  ?  ? ?Advanced Directives Status: See Vynca application for related entries. ? ?Care Plan ? ?Allergies  ?Allergen Reactions  ? Other  Hives, Swelling and Rash  ?  Pt reports Ajovy.   ? Ajovy [Fremanezumab-Vfrm] Hives, Swelling and Rash  ? ? ?Outpatient Encounter Medications as of 10/05/2021  ?Medication Sig  ? albuterol (VENTOLIN HFA) 108 (90 Base) MCG/ACT inhaler Inhale 1-2 puffs into the lungs every 6 (six) hours as needed for wheezing or shortness of breath.  ? amoxicillin (AMOXIL) 875 MG tablet Take 1 tablet (875 mg total) by mouth 2 (two) times daily for 10 days.  ? cyclobenzaprine (FLEXERIL) 10 MG tablet Take 1 tablet (10 mg total) by mouth 3 (three) times daily as needed for muscle spasms.  ? FLUoxetine (PROZAC) 20 MG tablet Take 1 tablet (20 mg total) by mouth daily.  ? fluticasone (FLONASE) 50 MCG/ACT nasal spray Place 2 sprays into both nostrils daily.  ? furosemide (LASIX) 20 MG tablet Take 1 tablet (20 mg total) by mouth daily.  ? Galcanezumab-gnlm (EMGALITY) 120 MG/ML SOAJ Inject 120 mg into the skin every 30 (thirty) days.  ? guaiFENesin (MUCINEX) 600 MG 12 hr tablet Take by mouth 2 (two) times daily as needed.  ? linaclotide (LINZESS) 290 MCG CAPS capsule Take 1 capsule (290 mcg total) by mouth daily before breakfast.  ? montelukast (SINGULAIR) 10 MG tablet Take 1 tablet (10 mg total) by mouth at bedtime.  ? omeprazole (PRILOSEC) 20 MG capsule Take 1 capsule (20 mg total) by mouth daily.  ? OVER THE COUNTER MEDICATION Vitamin for constipation daily  ? polyethylene glycol (MIRALAX / GLYCOLAX) 17 g packet Take 17 g by mouth daily.  ? potassium chloride SA (KLOR-CON) 20 MEQ tablet Take 1 tablet (20 mEq total) by mouth daily.  ? Probiotic Product (PROBIOTIC ADVANCED PO) Take by mouth.  ? propranolol (INDERAL) 20  MG tablet Take 1 tablet (20 mg total) by mouth 3 (three) times daily as needed.  ? rosuvastatin (CRESTOR) 10 MG tablet Take 1 tablet (10 mg total) by mouth daily.  ? tirzepatide (MOUNJARO) 5 MG/0.5ML Pen Inject 5 mg into the skin once a week.  ? Ubrogepant (UBRELVY) 100 MG TABS Take 100 mg by mouth daily as needed. Take one tablet  at onset of headache, may repeat 1 tablet in 2 hours, no more than 2 tablets in 24 hours  ? UNABLE TO FIND Med Name: Renew Life Cleanse More  ? Vitamin D, Ergocalciferol, (DRISDOL) 1.25 MG (50000 UNIT) CAPS capsule Take 1 capsule by mouth once a week  ? ?No facility-administered encounter medications on file as of 10/05/2021.  ? ? ?Patient Active Problem List  ? Diagnosis Date Noted  ? Low back pain 07/06/2021  ? Irregular menses 02/07/2021  ? Sinusitis 01/26/2021  ? Anxiety with depression 11/10/2020  ? Family history of colon cancer in mother 10/04/2020  ? Chronic idiopathic constipation 08/12/2020  ? Lower extremity edema 08/12/2020  ? OSA (obstructive sleep apnea) 05/22/2020  ? Circadian rhythm sleep disorder, shift work type 04/15/2020  ? Obesity hypoventilation syndrome (HCC) 04/15/2020  ? Vitamin D deficiency 01/06/2020  ? Gastroesophageal reflux disease 01/06/2020  ? Encounter for general adult medical examination with abnormal findings 12/23/2019  ? Palpitation 12/16/2019  ? Depression, major, single episode, moderate (HCC) 10/01/2019  ? Migraine 10/01/2019  ? Mild intermittent asthma without complication 10/01/2019  ? Nicotine abuse 10/01/2019  ? Morbid obesity (HCC) 10/01/2019  ? Essential hypertension 10/01/2019  ? ? ?Conditions to be addressed/monitored: monitor Jacqueline Collins management of depression issues.  ? ?Care Plan : Jacqueline Collins Care Plan  ?Updates made by Jacqueline Collins, Jacqueline Eldredge Collins, Jacqueline Collins since 10/05/2021 12:00 AM  ?  ? ?Problem: Coping Skills (General Plan of Care)   ?  ? ?Goal: Coping Skills Enhanced. Manage daily activities; Manage Depression issues   ?Start Date: 10/05/2021  ?Expected End Date: 01/02/2022  ?This Visit'Collins Progress: Not on track  ?Priority: Medium  ?Note:   ?Current barriers:   ?Mobility challenges ?Depression issues ?Sleeping challenges ?Financial issues ? ?Clinical Goals:  ?Jacqueline Collins to call Jacqueline Collins in next 6 weeks to discuss Jacqueline Collins management of depression issues and mobility issues ?Jacqueline Collins to call Jacqueline Collins in next  6 weeks to discuss Jacqueline Collins financial issues ?Jacqueline Collins to call RNCM as needed in next month to discuss nursing needs of Jacqueline Collins ?Jacqueline Collins to attend scheduled medical appointments in next 30 days ? ?Clinical Interventions:  ?Collaboration with Anabel HalonPatel, Rutwik K, MD regarding development and update of comprehensive plan of care as evidenced by provider attestation and co-signature ?Assessment of needs, barriers of Jacqueline Collins. ?Completion of Assessments: PHQ 2/9;  GAD-7 ?Discussed Jacqueline Collins needs with Tennis MustKendra Y. Stallone ?Discussed sleeping issues of Jacqueline Collins. Enrique SackKendra said she has difficulty sleeping ?Reviewed pain issues of Jacqueline Collins. She spoke of back pain and spoke of neck pain ?Discussed migraines of Jacqueline Collins. She said she has had migraine headaches for years. She said she sometimes wakes up with migraine headache ?Reviewed ambulation of Jacqueline Collins. She said she walks slowly. She said she sometimes gets short of breath when walking. She has to take rest breaks occasionally when walking  ?Reviewed financial challenges. She said she is unemployed. She has applied for Disability benefits. She was denied initially for these benefits but has appealed this decision. She is collecting medical information to use as part of this Disability application ?Reviewed family support. She said she has decreased family support.  She has some support from her friend and roommate, [name] ?Discussed relaxation techniques such as deep breathing and creative imagery ?Reviewed mood of Jacqueline Collins. She said she is sad occasionally. She is concerned about her health needs, financial needs, and about self care.  She is taking medications as prescribed ?Encouraged Marimar to call RNCM as needed in next 30 days for nursing support ?Encouraged Porschia to call Jacqueline Collins as needed in next 30 days for SW support ?Reviewed transport needs of Jacqueline Collins. She said she drives herself to appointments and to complete errands as needed ?Reviewed muscle spasms challenges. She said she does take  Flexeril; but sometimes these spasm are difficulty to manage. ?Reviewed with Jacqueline Collins her involvement with [facility]. She said she has been going to this clinic for several m

## 2021-10-05 NOTE — Patient Instructions (Signed)
Visit Information ? ?Thank you for taking time to visit with me today. Please don't hesitate to contact me if I can be of assistance to you before our next scheduled telephone appointment. ? ?Following are the goals we discussed today:  ? ?Our next appointment is by telephone on 11/25/21 at 2:00 PM ? ?Please call the care guide team at [phone] if you need to cancel or reschedule your appointment.  ? ?If you are experiencing a Mental Health or Behavioral Health Crisis or need someone to talk to, please call the [facility]: [phone]  ? ?Following is a copy of your full plan of care:  ?Care Plan : LCSW Care Plan  ?Updates made by Jacqueline Blakes, LCSW since 10/05/2021 12:00 AM  ?  ? ?Problem: Coping Skills (General Plan of Care)   ?  ? ?Goal: Coping Skills Enhanced. Manage daily activities; Manage Depression issues   ?Start Date: 10/05/2021  ?Expected End Date: 01/02/2022  ?This Visit's Progress: Not on track  ?Priority: Medium  ?Note:   ?Current barriers:   ?Mobility challenges ?Depression issues ?Sleeping challenges ?Financial issues ? ?Clinical Goals:  ?LCSW to call client in next 6 weeks to discuss client management of depression issues and mobility issues ?LCSW to call client in next 6 weeks to discuss client financial issues ?Client to call RNCM as needed in next month to discuss nursing needs of client ?Client to attend scheduled medical appointments in next 30 days ? ?Clinical Interventions:  ?Collaboration with Jacqueline Halon, MD regarding development and update of comprehensive plan of care as evidenced by provider attestation and co-signature ?Assessment of needs, barriers of client. ?Completion of Assessments: PHQ 2/9;  GAD-7 ?Discussed client needs with Jacqueline Collins. Jacqueline Collins ?Discussed sleeping issues of client. Jacqueline Collins said she has difficulty sleeping ?Reviewed pain issues of client. She spoke of back pain and spoke of neck pain ?Discussed migraines of client. She said she has had  migraine headaches for years. She said she sometimes wakes up with migraine headache ?Reviewed ambulation of client. She said she walks slowly. She said she sometimes gets short of breath when walking. She has to take rest breaks occasionally when walking  ?Reviewed financial challenges. She said she is unemployed. She has applied for Disability benefits. She was denied initially for these benefits but has appealed this decision. She is collecting medical information to use as part of this Disability application ?Reviewed family support. She said she has decreased family support. She has some support from her friend and roommate, Jacqueline Collins ?Discussed relaxation techniques such as deep breathing and creative imagery ?Reviewed mood of client. She said she is sad occasionally. She is concerned about her health needs, financial needs, and about self care.  She is taking medications as prescribed ?Encouraged Jacqueline Collins to call RNCM as needed in next 30 days for nursing support ?Encouraged Jacqueline Collins to call LCSW as needed in next 30 days for SW support ?Reviewed transport needs of client. She said she drives herself to appointments and to complete errands as needed ?Reviewed muscle spasms challenges. She said she does take Flexeril; but sometimes these spasm are difficulty to manage. ?Reviewed with client her involvement with [facility]. She said she has been going to this clinic for several months and finds it very helpful. She said providers at this Clinic talk with her about her eating habits and about her weight management ?Provided counseling support for Jacqueline Collins ? ?Patient Coping Skills: ?Has support from friend,  Jacqueline Collins ?Has no transport needs ?Takes medications as prescribed ? ?Patient Deficits: ?Depression issues ?Some mobility issues ?Pain issues ?Financial issues ? ?Patient Goals: In next 30 days, patient will, ?Attend scheduled medical appointments ?Call RNCM as needed for nursing  support ?Call LCSW as needed for SW support ? ?Follow Up Plan:  LCSW to call client on 11/25/21 at 2:00 PM  ?  ? ?Jacqueline Collins was given information about Care Management services by the embedded care coordination team including:  ?Care Management services include personalized support from designated clinical staff supervised by her physician, including individualized plan of care and coordination with other care providers ?24/7 contact phone numbers for assistance for urgent and routine care needs. ?The patient may stop CCM services at any time (effective at the end of the month) by phone call to the office staff. ? ?Patient agreed to services and verbal consent obtained.  ? ?The patient verbalized understanding of instructions, educational materials, and care plan provided today and agreed to receive a mailed copy of patient instructions, educational materials, and care plan.  ? ?Jacqueline Collins MSW, LCSW ?Licensed Clinical Social Worker ?[facility] Care Management ?[phone] ?

## 2021-10-06 DIAGNOSIS — G894 Chronic pain syndrome: Secondary | ICD-10-CM | POA: Insufficient documentation

## 2021-10-06 DIAGNOSIS — R7303 Prediabetes: Secondary | ICD-10-CM | POA: Insufficient documentation

## 2021-10-07 ENCOUNTER — Telehealth: Payer: 59

## 2021-11-09 ENCOUNTER — Other Ambulatory Visit: Payer: Self-pay | Admitting: Internal Medicine

## 2021-11-09 ENCOUNTER — Other Ambulatory Visit: Payer: Self-pay | Admitting: Family Medicine

## 2021-11-09 DIAGNOSIS — E559 Vitamin D deficiency, unspecified: Secondary | ICD-10-CM

## 2021-11-18 ENCOUNTER — Ambulatory Visit: Payer: 59 | Admitting: Family Medicine

## 2021-11-18 ENCOUNTER — Encounter: Payer: Self-pay | Admitting: Family Medicine

## 2021-11-18 DIAGNOSIS — J039 Acute tonsillitis, unspecified: Secondary | ICD-10-CM | POA: Insufficient documentation

## 2021-11-18 DIAGNOSIS — I10 Essential (primary) hypertension: Secondary | ICD-10-CM

## 2021-11-18 LAB — POCT RAPID STREP A (OFFICE): Rapid Strep A Screen: NEGATIVE

## 2021-11-18 MED ORDER — PENICILLIN V POTASSIUM 500 MG PO TABS: 500.0000 mg | ORAL_TABLET | Freq: Three times a day (TID) | ORAL | 0 refills | Status: DC

## 2021-11-18 NOTE — Patient Instructions (Signed)
F/U with PCP in 3 to 6 weeks, call if you need to be seen sooner  Nurse please do rapid strep test  Pen v is prescribed for 10 days, for tonsilitis  Thanks for choosing [facility], we consider it a privelige to serve you.

## 2021-11-18 NOTE — Progress Notes (Addendum)
   Jacqueline Collins     MRN: [id number]      DOB: 1985/07/08   HPI Ms. Falconi is here  with a 1 week h/o painful enlarged left tonsil with spot on it, left ear pain , dental caries and tongue with patches getting larger.   ROS Denies recent fever or chills. . Denies chest congestion, productive cough or wheezing. Denies chest pains, palpitations and leg swelling   PE BP 124/72   Pulse 99   Temp 98.4 F (36.9 C) (Oral)   Resp 16   Ht 5\' 2"  (1.575 m)   Wt (!) 360 lb 1.9 oz (163.3 kg)   SpO2 97%   BMI 65.87 kg/m  Patient alert and oriented and in no cardiopulmonary distress.  HEENT: No facial asymmetry, EOMI,     Neck supple .Bilateral cervical adenitis Ulcer on left tonsil which is enlarged Geographic tongue  Chest: Clear to auscultation bilaterally.  CVS: S1, S2 no murmurs, no S3.Regular rate.   Ext: No edema  Psych: Good eye contact, normal affect.  not anxious or depressed appearing.     Assessment & Plan  Acute tonsillitis Enlarged left tonsil with ilcer, treat with pen V despite negative rapid strep. Also reports dental caries  Essential hypertension Controlled, no change in medication

## 2021-11-20 ENCOUNTER — Encounter: Payer: Self-pay | Admitting: Family Medicine

## 2021-11-20 DIAGNOSIS — J039 Acute tonsillitis, unspecified: Secondary | ICD-10-CM | POA: Insufficient documentation

## 2021-11-20 NOTE — Assessment & Plan Note (Signed)
Enlarged left tonsil with ilcer, treat with pen V despite negative rapid strep. Also reports dental caries

## 2021-11-20 NOTE — Assessment & Plan Note (Signed)
Controlled, no change in medication  

## 2021-11-21 ENCOUNTER — Ambulatory Visit: Payer: 59 | Admitting: Internal Medicine

## 2021-11-24 ENCOUNTER — Encounter: Payer: Self-pay | Admitting: Internal Medicine

## 2021-11-24 ENCOUNTER — Other Ambulatory Visit: Payer: Self-pay | Admitting: *Deleted

## 2021-11-24 DIAGNOSIS — J329 Chronic sinusitis, unspecified: Secondary | ICD-10-CM

## 2021-11-24 DIAGNOSIS — J039 Acute tonsillitis, unspecified: Secondary | ICD-10-CM

## 2021-11-24 IMAGING — DX DG ANKLE COMPLETE 3+V*L*
3 series · 4 of 4 positions shown · non-contrast
Comparison: None.

CLINICAL DATA: Recent fall with left ankle pain, initial encounter

EXAM:
LEFT ANKLE COMPLETE - 3+ VIEW

[ankle ap]
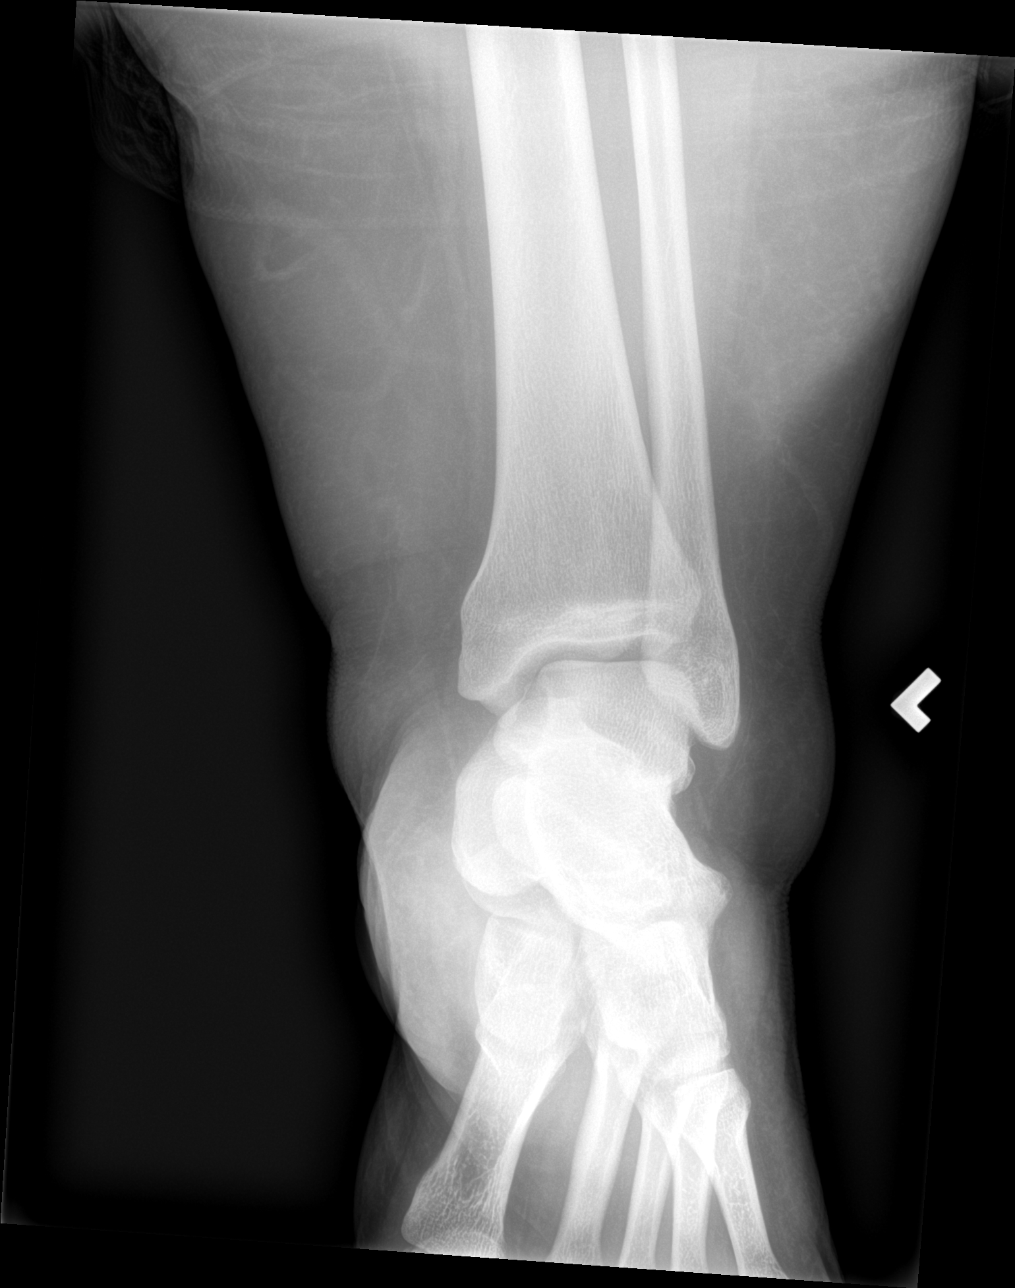

[Series 2: ankle obl · 0.14mm/px · 2 of 2 slices shown]
[im 1/2]
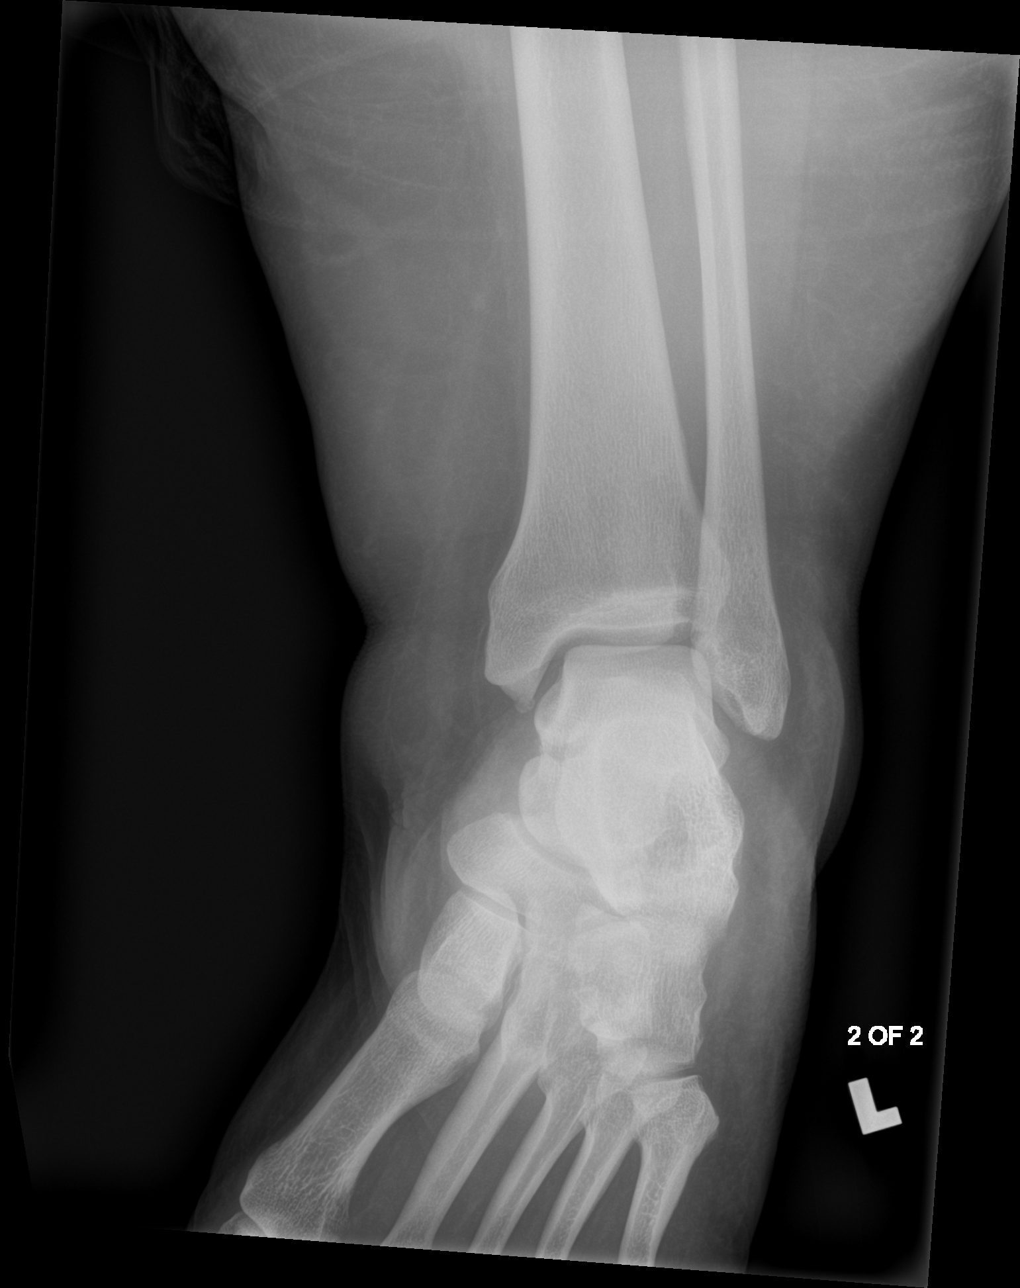
[im 2/2]
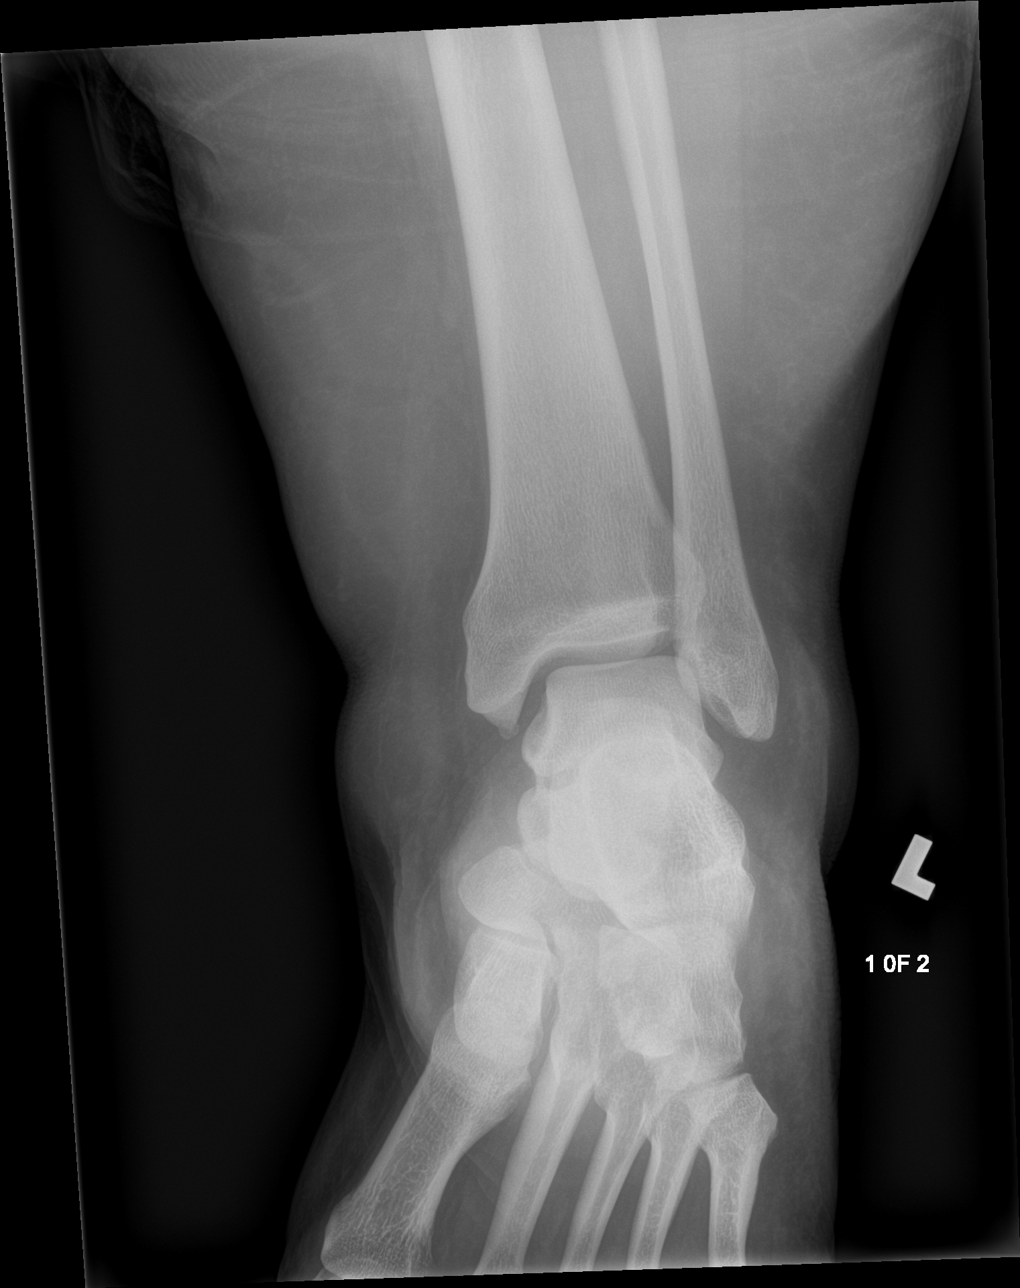

[ankle lat]
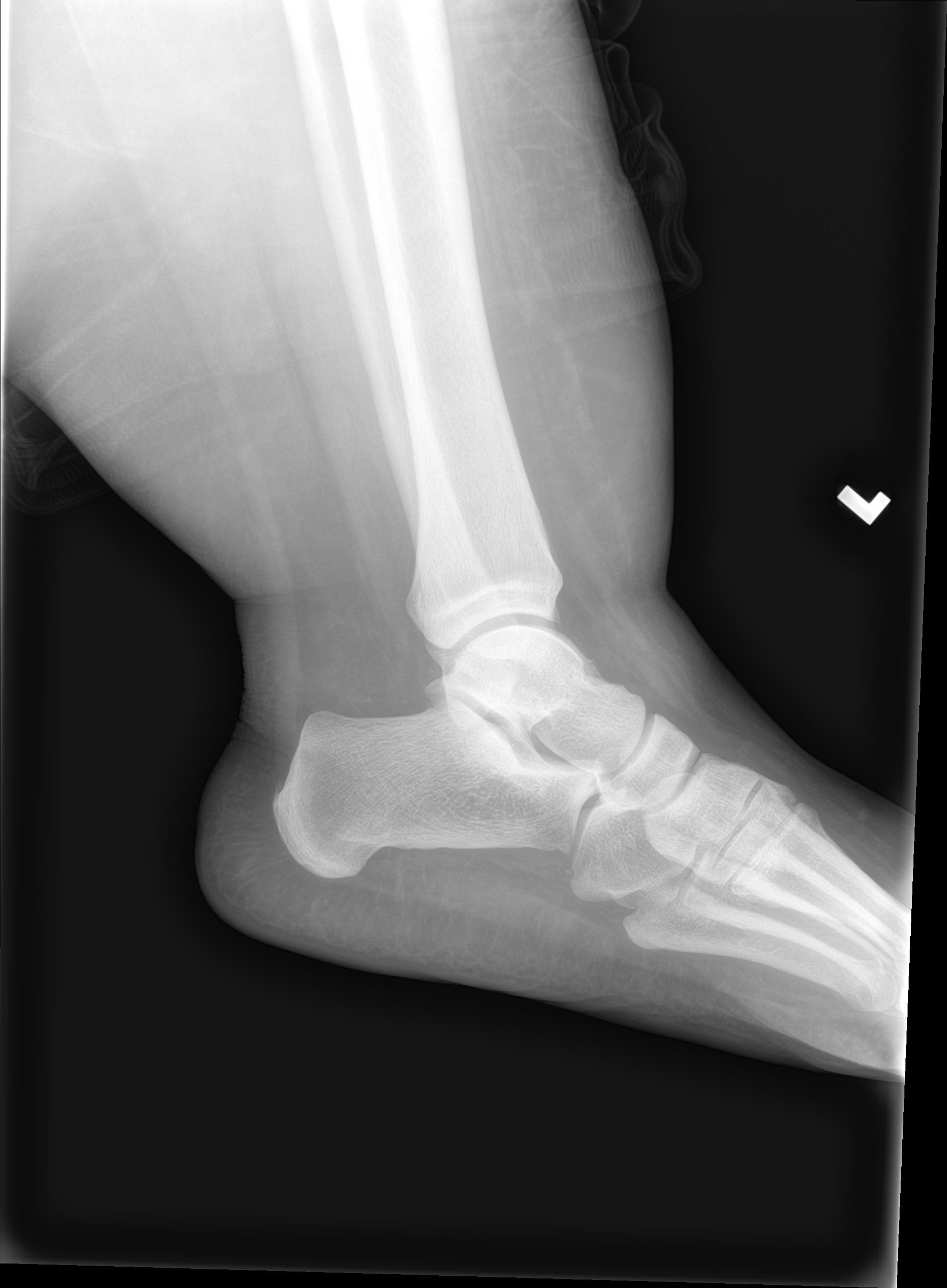

[4 of 4 positions shown; findings below may reference images not displayed]

FINDINGS: Mild soft tissue swelling is noted about the ankle. There is a tiny
bony density identified adjacent to cuboid bone seen only on the
lateral projection which may represent a small avulsion. No other
focal abnormality is noted.
IMPRESSION: Possible avulsion from the cuboid seen only on the lateral film.
Correlate to point tenderness.

Generalized soft tissue swelling.

## 2021-11-24 IMAGING — DX DG FOOT COMPLETE 3+V*L*
3 series · 3 of 3 positions shown · non-contrast
Comparison: None.

CLINICAL DATA: Fall today with left foot and ankle pain, initial
encounter

EXAM:
LEFT FOOT - COMPLETE 3+ VIEW

[foot ap]
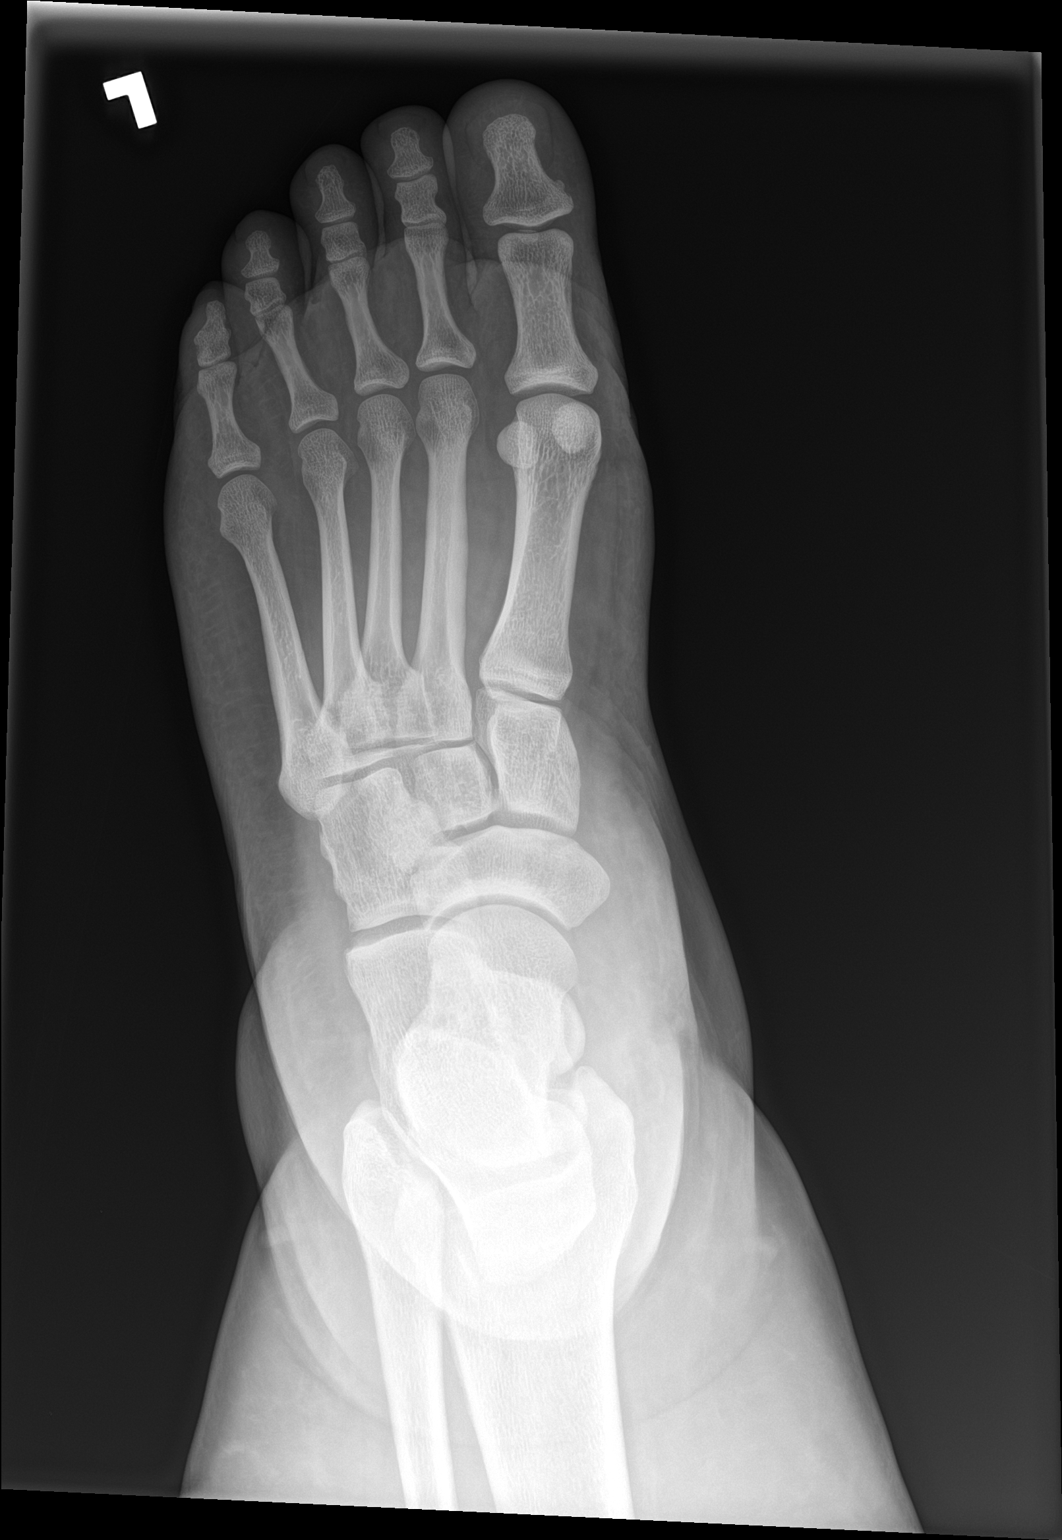

[foot obl]
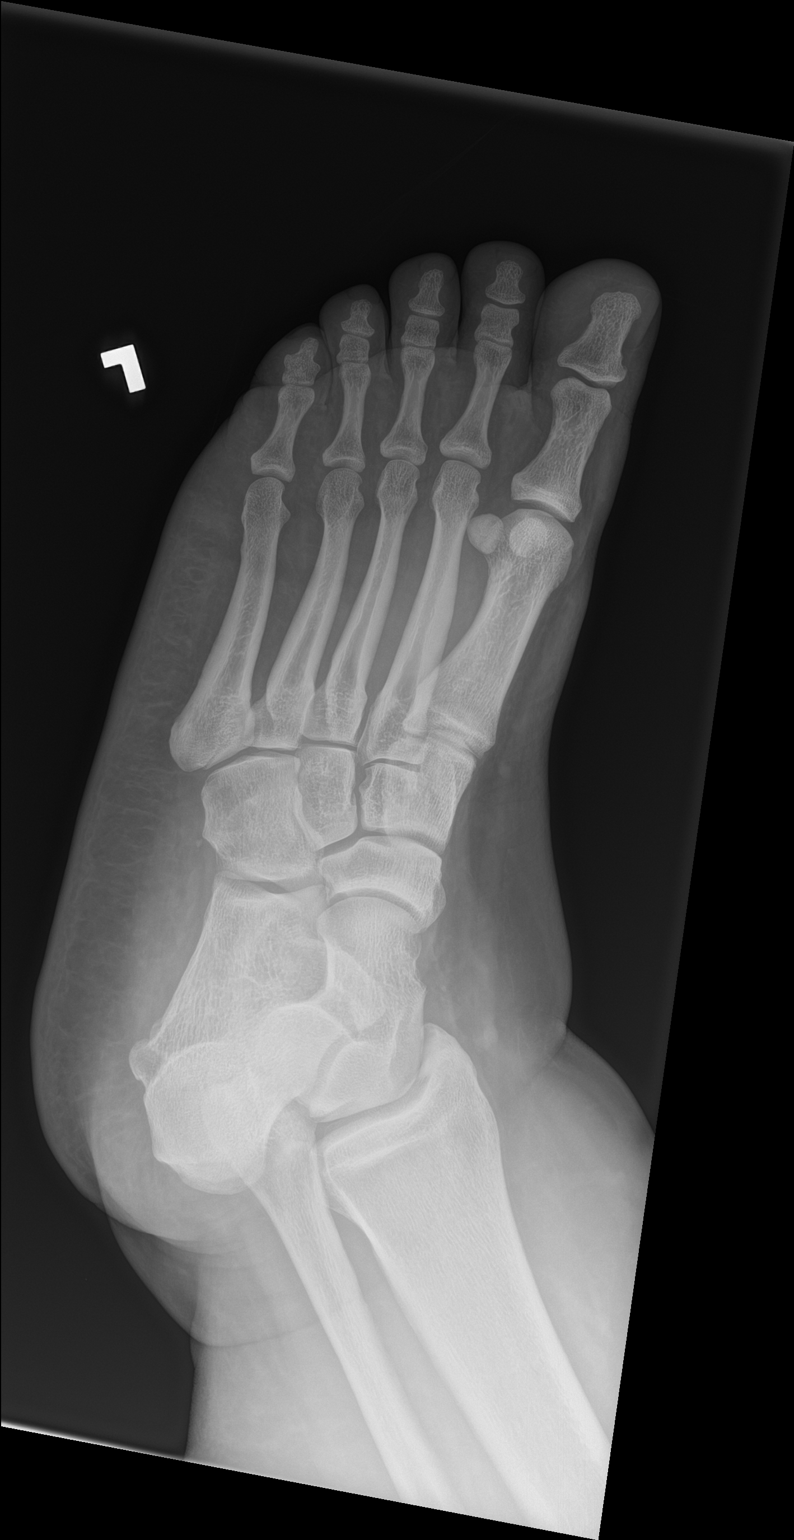

[foot lat]
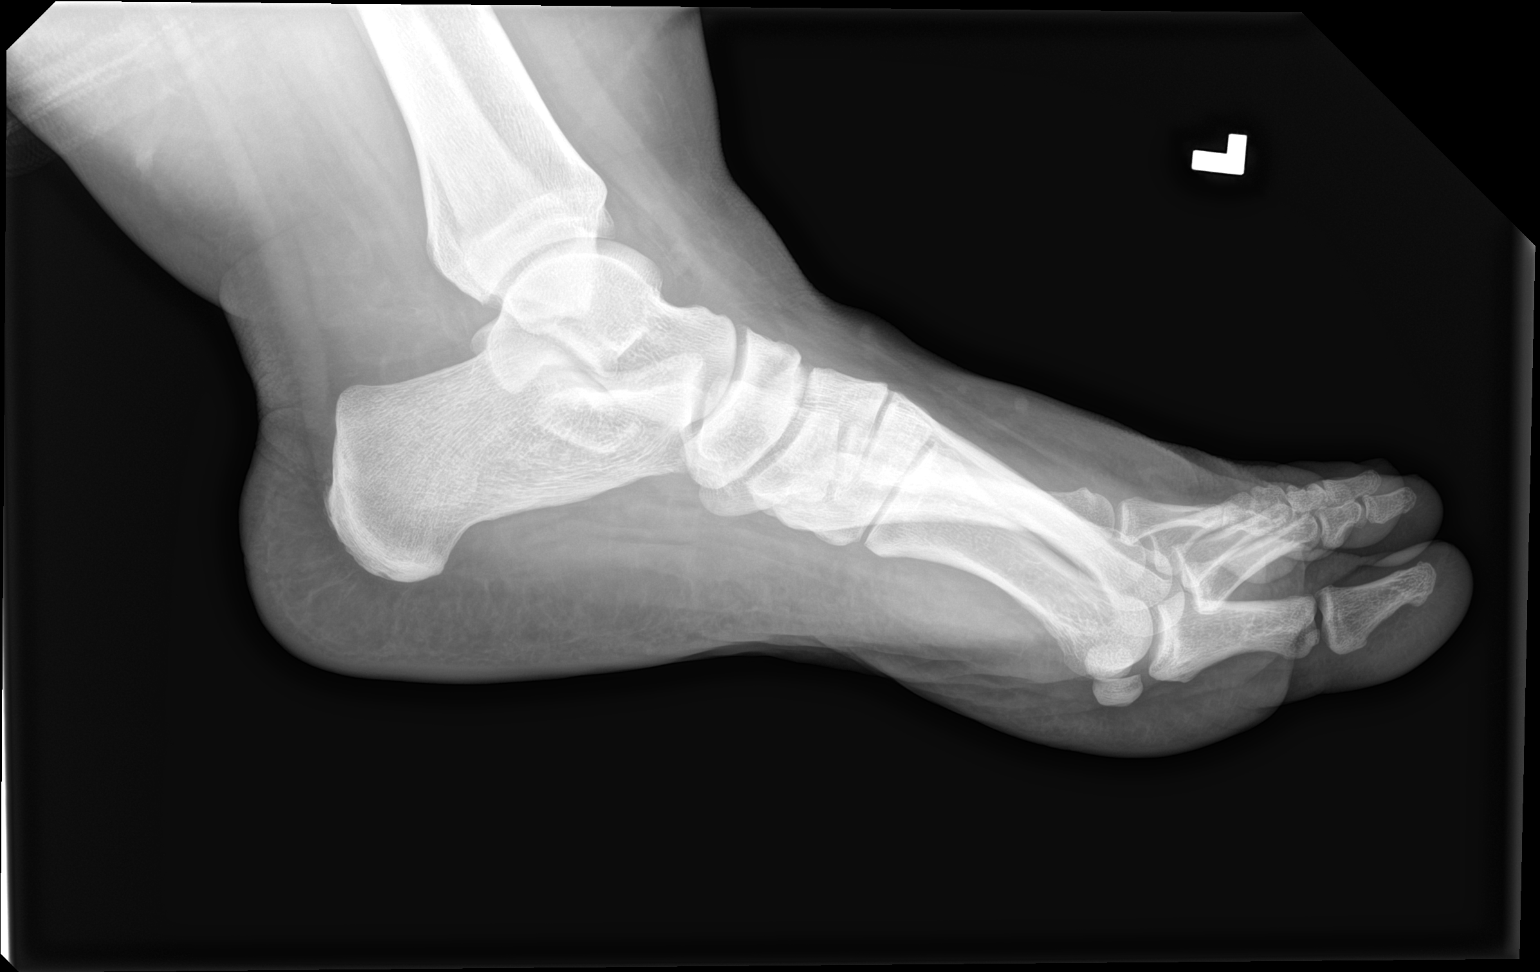

[3 of 3 positions shown; findings below may reference images not displayed]

FINDINGS: There is no evidence of fracture or dislocation. There is no
evidence of arthropathy or other focal bone abnormality. Soft
tissues are unremarkable.
IMPRESSION: No acute abnormality noted.

## 2021-11-25 ENCOUNTER — Telehealth: Payer: 59

## 2021-11-29 ENCOUNTER — Ambulatory Visit (INDEPENDENT_AMBULATORY_CARE_PROVIDER_SITE_OTHER): Payer: 59

## 2021-11-29 ENCOUNTER — Ambulatory Visit: Payer: 59 | Admitting: Podiatry

## 2021-11-29 DIAGNOSIS — M25572 Pain in left ankle and joints of left foot: Secondary | ICD-10-CM

## 2021-11-29 DIAGNOSIS — M25372 Other instability, left ankle: Secondary | ICD-10-CM

## 2021-11-29 DIAGNOSIS — M25472 Effusion, left ankle: Secondary | ICD-10-CM

## 2021-11-29 DIAGNOSIS — G8929 Other chronic pain: Secondary | ICD-10-CM

## 2021-11-30 ENCOUNTER — Encounter: Payer: Self-pay | Admitting: Internal Medicine

## 2021-12-06 ENCOUNTER — Telehealth: Payer: Self-pay | Admitting: *Deleted

## 2021-12-06 ENCOUNTER — Telehealth: Payer: 59 | Admitting: Family Medicine

## 2021-12-06 MED ORDER — MELOXICAM 7.5 MG PO TABS: 7.5000 mg | ORAL_TABLET | Freq: Every day | ORAL | 0 refills | Status: DC | PRN

## 2021-12-06 NOTE — Telephone Encounter (Signed)
Patient is calling for status of an anti inflammatory medicine that was supposed to be sent to pharmacy. Please advise.

## 2021-12-06 NOTE — Telephone Encounter (Signed)
Patient notified
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
## 2021-12-16 ENCOUNTER — Other Ambulatory Visit: Payer: Self-pay | Admitting: Adult Health

## 2021-12-16 ENCOUNTER — Other Ambulatory Visit: Payer: Self-pay | Admitting: Family Medicine

## 2021-12-16 DIAGNOSIS — J039 Acute tonsillitis, unspecified: Secondary | ICD-10-CM

## 2021-12-28 ENCOUNTER — Other Ambulatory Visit: Payer: Self-pay | Admitting: Podiatry

## 2021-12-28 NOTE — Telephone Encounter (Signed)
Interaction between medication and prozac, please advise.

## 2022-01-04 ENCOUNTER — Encounter: Payer: Self-pay | Admitting: Internal Medicine

## 2022-01-04 ENCOUNTER — Ambulatory Visit: Payer: 59 | Admitting: Internal Medicine

## 2022-01-09 ENCOUNTER — Ambulatory Visit (HOSPITAL_COMMUNITY): Payer: 59 | Attending: Podiatry

## 2022-01-09 DIAGNOSIS — S93402A Sprain of unspecified ligament of left ankle, initial encounter: Secondary | ICD-10-CM | POA: Diagnosis not present

## 2022-01-09 DIAGNOSIS — M25572 Pain in left ankle and joints of left foot: Secondary | ICD-10-CM | POA: Insufficient documentation

## 2022-01-09 DIAGNOSIS — Y939 Activity, unspecified: Secondary | ICD-10-CM | POA: Diagnosis not present

## 2022-01-09 DIAGNOSIS — R262 Difficulty in walking, not elsewhere classified: Secondary | ICD-10-CM | POA: Insufficient documentation

## 2022-01-09 DIAGNOSIS — G8929 Other chronic pain: Secondary | ICD-10-CM | POA: Diagnosis not present
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
## 2022-01-17 ENCOUNTER — Ambulatory Visit (HOSPITAL_COMMUNITY): Payer: 59 | Attending: Internal Medicine | Admitting: Physical Therapy

## 2022-01-17 DIAGNOSIS — M25572 Pain in left ankle and joints of left foot: Secondary | ICD-10-CM | POA: Diagnosis present

## 2022-01-17 DIAGNOSIS — M545 Low back pain, unspecified: Secondary | ICD-10-CM | POA: Insufficient documentation

## 2022-01-17 DIAGNOSIS — M6281 Muscle weakness (generalized): Secondary | ICD-10-CM | POA: Diagnosis present

## 2022-01-17 DIAGNOSIS — G8929 Other chronic pain: Secondary | ICD-10-CM | POA: Insufficient documentation

## 2022-01-17 DIAGNOSIS — R262 Difficulty in walking, not elsewhere classified: Secondary | ICD-10-CM | POA: Insufficient documentation
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
## 2022-01-19 ENCOUNTER — Ambulatory Visit: Payer: 59 | Admitting: Internal Medicine

## 2022-01-19 ENCOUNTER — Encounter: Payer: Self-pay | Admitting: Internal Medicine

## 2022-01-19 VITALS — BP 110/73 | HR 89 | Ht 62.0 in | Wt 362.8 lb

## 2022-01-19 DIAGNOSIS — Z114 Encounter for screening for human immunodeficiency virus [HIV]: Secondary | ICD-10-CM

## 2022-01-19 DIAGNOSIS — I10 Essential (primary) hypertension: Secondary | ICD-10-CM | POA: Diagnosis not present

## 2022-01-19 DIAGNOSIS — J329 Chronic sinusitis, unspecified: Secondary | ICD-10-CM

## 2022-01-19 DIAGNOSIS — E662 Morbid (severe) obesity with alveolar hypoventilation: Secondary | ICD-10-CM

## 2022-01-19 DIAGNOSIS — B001 Herpesviral vesicular dermatitis: Secondary | ICD-10-CM

## 2022-01-19 DIAGNOSIS — D649 Anemia, unspecified: Secondary | ICD-10-CM

## 2022-01-19 DIAGNOSIS — Z113 Encounter for screening for infections with a predominantly sexual mode of transmission: Secondary | ICD-10-CM | POA: Diagnosis not present

## 2022-01-19 MED ORDER — VALACYCLOVIR HCL 1 G PO TABS: 1000.0000 mg | ORAL_TABLET | Freq: Two times a day (BID) | ORAL | 0 refills | Status: AC

## 2022-01-19 MED ORDER — MONTELUKAST SODIUM 10 MG PO TABS: 10.0000 mg | ORAL_TABLET | Freq: Every day | ORAL | 5 refills | Status: DC

## 2022-01-19 NOTE — Assessment & Plan Note (Addendum)
Needs to use CPAP device regularly

## 2022-01-19 NOTE — Assessment & Plan Note (Signed)
BP Readings from Last 1 Encounters:  01/19/22 110/73   Well-controlled with Lasix Counseled for compliance with the medications Advised DASH diet and moderate exercise/walking, at least 150 mins/week

## 2022-01-19 NOTE — Assessment & Plan Note (Signed)
Her chronic nasal congestion and postnasal drip likely due to chronic sinusitis Refilled Singulair
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
## 2022-01-19 NOTE — Assessment & Plan Note (Signed)
Started valacyclovir Check STD screening

## 2022-01-24 ENCOUNTER — Ambulatory Visit: Payer: 59 | Admitting: Podiatry

## 2022-01-26 LAB — CT, NG, MYCOPLASMAS NAA, URINE
Chlamydia trachomatis, NAA: NEGATIVE
Mycoplasma genitalium NAA: NEGATIVE
Mycoplasma hominis NAA: NEGATIVE
Neisseria gonorrhoeae, NAA: NEGATIVE
Ureaplasma spp NAA: NEGATIVE

## 2022-01-26 LAB — ABO AND RH: Rh Factor: POSITIVE

## 2022-01-26 LAB — HIV ANTIBODY (ROUTINE TESTING W REFLEX): HIV Screen 4th Generation wRfx: NONREACTIVE

## 2022-01-26 LAB — HEPATITIS B SURFACE ANTIGEN: Hepatitis B Surface Ag: NEGATIVE

## 2022-01-26 LAB — HEPATITIS C ANTIBODY: Hep C Virus Ab: NONREACTIVE

## 2022-01-26 LAB — RPR: RPR Ser Ql: NONREACTIVE

## 2022-02-01 ENCOUNTER — Encounter (HOSPITAL_COMMUNITY): Payer: 59

## 2022-02-07 ENCOUNTER — Encounter: Payer: Self-pay | Admitting: *Deleted

## 2022-02-07 ENCOUNTER — Telehealth: Payer: Self-pay

## 2022-02-07 NOTE — Telephone Encounter (Signed)
This has been printed and left at the front for pick up please let the patient know

## 2022-02-07 NOTE — Telephone Encounter (Signed)
Patient called needs a new letter says she still unable to work due to chronic medical conditions. Patient says was just seen 07.20.2023 and Dr Allena Katz is aware patient is not working.

## 2022-02-08 ENCOUNTER — Encounter (INDEPENDENT_AMBULATORY_CARE_PROVIDER_SITE_OTHER): Payer: Self-pay

## 2022-02-08 ENCOUNTER — Ambulatory Visit (HOSPITAL_COMMUNITY): Payer: 59 | Attending: Internal Medicine | Admitting: Physical Therapy

## 2022-02-08 DIAGNOSIS — M25572 Pain in left ankle and joints of left foot: Secondary | ICD-10-CM | POA: Diagnosis present

## 2022-02-08 DIAGNOSIS — R262 Difficulty in walking, not elsewhere classified: Secondary | ICD-10-CM | POA: Diagnosis present
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
## 2022-02-08 NOTE — Telephone Encounter (Signed)
Called patient, will pick up.

## 2022-02-13 ENCOUNTER — Ambulatory Visit: Payer: 59 | Admitting: Internal Medicine

## 2022-02-13 ENCOUNTER — Encounter: Payer: Self-pay | Admitting: Internal Medicine

## 2022-02-15 ENCOUNTER — Encounter (HOSPITAL_COMMUNITY): Payer: 59 | Admitting: Physical Therapy

## 2022-02-16 ENCOUNTER — Other Ambulatory Visit: Payer: Self-pay | Admitting: Internal Medicine

## 2022-02-16 DIAGNOSIS — G8929 Other chronic pain: Secondary | ICD-10-CM

## 2022-02-16 DIAGNOSIS — E559 Vitamin D deficiency, unspecified: Secondary | ICD-10-CM

## 2022-02-16 DIAGNOSIS — E876 Hypokalemia: Secondary | ICD-10-CM

## 2022-02-20 ENCOUNTER — Ambulatory Visit: Payer: 59 | Admitting: Podiatry

## 2022-02-20 DIAGNOSIS — G629 Polyneuropathy, unspecified: Secondary | ICD-10-CM | POA: Diagnosis not present

## 2022-02-20 DIAGNOSIS — M2550 Pain in unspecified joint: Secondary | ICD-10-CM | POA: Diagnosis not present

## 2022-02-20 DIAGNOSIS — M25372 Other instability, left ankle: Secondary | ICD-10-CM

## 2022-02-20 DIAGNOSIS — S86302S Unspecified injury of muscle(s) and tendon(s) of peroneal muscle group at lower leg level, left leg, sequela: Secondary | ICD-10-CM | POA: Diagnosis not present

## 2022-02-20 MED ORDER — HYDROCODONE-ACETAMINOPHEN 5-325 MG PO TABS: 1.0000 | ORAL_TABLET | Freq: Four times a day (QID) | ORAL | 0 refills | Status: DC | PRN

## 2022-02-20 NOTE — Patient Instructions (Signed)
I have ordered a MRI of the left ankle and also referral to rheumatology for arthritis and neurology for the nerve symptoms. If you do not hear for them about scheduling within the next 1 week, or you have any questions please give [place] a call at [phone].
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
## 2022-02-20 NOTE — Progress Notes (Signed)
Subjective: 36 year old female presents the office today for follow-up evaluation of left ankle pain which has been ongoing for quite some time.  She states that she does take Vicodin at times which does help.  She is also describing tingling, burning sensations to her feet like up to her legs.  Also endorses other joint pains.  Objective: AAO x3, NAD DP/PT pulses palpable bilaterally, CRT less than 3 seconds There is tenderness palpation of the lateral aspect of the left ankle and the course the peroneal tendon.  Clinically the tendon appears to be intact.  She has mild diffuse discomfort of the feet.  Also tenderness to palpation on plantar aspect calcaneal insertion of plantar fascia.  Plantar fascia appears to be intact.  No specific area pinpoint tenderness.  Ankle range of motion intact.  MMT 5/5 no pain with calf compression, swelling, warmth, erythema  Assessment: Peroneal tendinitis, Plantar fasciitis; chronic foot pain, concern for nerve symptoms  Plan: -All treatment options discussed with the patient including all alternatives, risks, complications.  -Due to the sharp pains going up her leg she also endorses neck pain with burning ongoing to refer her to neurology. -Given her ongoing chronic pain of her left ankle we will order an MRI. -Given other joint pains refer to rheumatology -I did give her a one-time dose of Vicodin but if she needs ongoing pain medicine will refer to pain clinic. -Patient encouraged to call the office with any questions, concerns, change in symptoms.   Vivi Barrack DPM

## 2022-02-21 ENCOUNTER — Encounter: Payer: Self-pay | Admitting: Family Medicine

## 2022-02-21 ENCOUNTER — Ambulatory Visit: Payer: 59 | Admitting: Family Medicine

## 2022-02-21 DIAGNOSIS — G4733 Obstructive sleep apnea (adult) (pediatric): Secondary | ICD-10-CM

## 2022-02-21 DIAGNOSIS — G43709 Chronic migraine without aura, not intractable, without status migrainosus: Secondary | ICD-10-CM

## 2022-02-22 ENCOUNTER — Encounter (INDEPENDENT_AMBULATORY_CARE_PROVIDER_SITE_OTHER): Payer: Self-pay | Admitting: Family Medicine

## 2022-02-22 ENCOUNTER — Ambulatory Visit (INDEPENDENT_AMBULATORY_CARE_PROVIDER_SITE_OTHER): Payer: 59 | Admitting: Family Medicine

## 2022-02-22 VITALS — BP 120/80 | HR 87 | Temp 98.4°F | Ht 62.0 in | Wt 357.0 lb

## 2022-02-22 DIAGNOSIS — Z7985 Long-term (current) use of injectable non-insulin antidiabetic drugs: Secondary | ICD-10-CM | POA: Diagnosis not present

## 2022-02-22 DIAGNOSIS — E669 Obesity, unspecified: Secondary | ICD-10-CM

## 2022-02-22 DIAGNOSIS — Z6841 Body Mass Index (BMI) 40.0 and over, adult: Secondary | ICD-10-CM | POA: Diagnosis not present

## 2022-02-22 DIAGNOSIS — R7303 Prediabetes: Secondary | ICD-10-CM

## 2022-02-22 MED ORDER — TIRZEPATIDE 5 MG/0.5ML ~~LOC~~ SOAJ: 5.0000 mg | SUBCUTANEOUS | 0 refills | Status: DC

## 2022-02-28 NOTE — Progress Notes (Signed)
Chief Complaint:   OBESITY Jacqueline Collins is here to discuss her progress with her obesity treatment plan along with follow-up of her obesity related diagnoses. Jacqueline Collins is on practicing portion control and making smarter food choices, such as increasing vegetables and decreasing simple carbohydrates and states she is following her eating plan approximately 50% of the time. Jacqueline Collins states she is walking and doing PT 30-60 minutes 7 times per week.  Today's visit was #: 12 Starting weight: 391 lbs Starting date: 01/31/2020 Today's weight: 357 lbs Today's date: 02/22/2022 Total lbs lost to date: 34 Total lbs lost since last in-office visit: 13  Interim History: Jacqueline Collins last in office OV was 05/04/2021. She was a pt of Dr. Earlene Plater. This is her first OV with me. Pt is down 13 lbs since November. She is in the process of getting approved for gastric bypass or sleeve.   Subjective:   1. Prediabetes Pt has been on Mounjaro since 03/31/2021 and denies side effects. Her A1c was 5.6 approximately 6 months ago. She is not following any meal plan and only lost 13 lbs since early November 2022.   Assessment/Plan:  No orders of the defined types were placed in this encounter.   Medications Discontinued During This Encounter  Medication Reason   tirzepatide Greggory Keen) 5 MG/0.5ML Pen Reorder     Meds ordered this encounter  Medications   tirzepatide (MOUNJARO) 5 MG/0.5ML Pen    Sig: Inject 5 mg into the skin once a week.    Dispense:  2 mL    Refill:  0     1. Prediabetes Jacqueline Collins will continue to work on weight loss, exercise, and decreasing simple carbohydrates to help decrease the risk of diabetes.  Pt is unable to eat all foods on plan, as she is not hungry. I recommend pt stop Mounjaro if she can't eat all foods on plan, but pt insists she will. Long discussion with pt regarding meds will likely not be covered by insurance for this condition.  Refill for 1 month only. No further refill until pt  is seen in office.- tirzepatide [facility]) 5 MG/0.5ML Pen; Inject 5 mg into the skin once a week.  Dispense: 2 mL; Refill: 0  2. Obesity, current BMI 65.3 Jacqueline Collins is currently in the action stage of change. As such, her goal is to continue with weight loss efforts. She has agreed to the Category 3 Plan with breakfast options.   I went over entire meal plan with pt and discussed strategies to eat all the food. Pt doesn't eat eggs but will substitute with greek yogurt.  Exercise goals:  As is  Behavioral modification strategies: increasing lean protein intake, decreasing simple carbohydrates, decreasing eating out, no skipping meals, and planning for success.  Jacqueline Collins has agreed to follow-up with our clinic in 3-4 weeks, 30 minutes prior fasting for IC and blood work. She was informed of the importance of frequent follow-up visits to maximize her success with intensive lifestyle modifications for her multiple health conditions.   Objective:   Blood pressure 120/80, pulse 87, temperature 98.4 F (36.9 C), height 5\' 2"  (1.575 m), weight (!) 357 lb (161.9 kg), SpO2 97 %. Body mass index is 65.3 kg/m.  General: Cooperative, alert, well developed, in no acute distress. HEENT: Conjunctivae and lids unremarkable. Cardiovascular: Regular rhythm.  Lungs: Normal work of breathing. Neurologic: No focal deficits.   Lab Results  Component Value Date   CREATININE 0.92 08/15/2021   BUN 10 08/15/2021  NA 138 08/15/2021   K 4.2 08/15/2021   CL 101 08/15/2021   CO2 23 08/15/2021   Lab Results  Component Value Date   ALT 10 08/15/2021   AST 15 08/15/2021   ALKPHOS 103 08/15/2021   BILITOT 0.6 08/15/2021   Lab Results  Component Value Date   HGBA1C 5.6 08/15/2021   HGBA1C 6.1 (H) 02/23/2021   HGBA1C 5.9 (A) 01/06/2020   HGBA1C 5.9 01/06/2020   HGBA1C 5.9 01/06/2020   HGBA1C 5.9 01/06/2020   No results found for: "INSULIN" Lab Results  Component Value Date   TSH 3.340 08/15/2021   Lab  Results  Component Value Date   CHOL 187 08/15/2021   HDL 29 (L) 08/15/2021   LDLCALC 128 (H) 08/15/2021   TRIG 167 (H) 08/15/2021   CHOLHDL 6.4 (H) 08/15/2021   Lab Results  Component Value Date   VD25OH 27.5 (L) 08/15/2021   VD25OH 15.8 (L) 02/23/2021   VD25OH 14.2 (L) 01/13/2020   Lab Results  Component Value Date   WBC 8.0 08/15/2021   HGB 13.6 08/15/2021   HCT 40.5 08/15/2021   MCV 94 08/15/2021   PLT 369 08/15/2021   Lab Results  Component Value Date   IRON 84 01/13/2020   TIBC 353 01/13/2020   FERRITIN 82 01/13/2020    Attestation Statements:   Reviewed by clinician on day of visit: allergies, medications, problem list, medical history, surgical history, family history, social history, and previous encounter notes.  Time spent on visit including pre-visit chart review and post-visit care and charting was 40 minutes.   I, Kyung Rudd, BS, CMA, am acting as transcriptionist for [employer], DO.   I have reviewed the above documentation for accuracy and completeness, and I agree with the above. Carlye Grippe, D.O.  The 21st Century Cures Act was signed into law in 2016 which includes the topic of electronic health records.  This provides immediate access to information in MyChart.  This includes consultation notes, operative notes, office notes, lab results and pathology reports.  If you have any questions about what you read please let us know at your next visit so we can discuss your concerns and take corrective action if need be.  We are right here with you.

## 2022-03-10 ENCOUNTER — Other Ambulatory Visit: Payer: 59

## 2022-03-12 ENCOUNTER — Other Ambulatory Visit: Payer: Self-pay | Admitting: Podiatry

## 2022-03-16 ENCOUNTER — Encounter (INDEPENDENT_AMBULATORY_CARE_PROVIDER_SITE_OTHER): Payer: Self-pay | Admitting: Family Medicine

## 2022-03-16 ENCOUNTER — Ambulatory Visit (INDEPENDENT_AMBULATORY_CARE_PROVIDER_SITE_OTHER): Payer: 59 | Admitting: Family Medicine

## 2022-03-16 VITALS — BP 122/81 | HR 96 | Temp 98.1°F | Ht 62.0 in | Wt 348.0 lb

## 2022-03-16 DIAGNOSIS — E669 Obesity, unspecified: Secondary | ICD-10-CM

## 2022-03-16 DIAGNOSIS — E559 Vitamin D deficiency, unspecified: Secondary | ICD-10-CM | POA: Diagnosis not present

## 2022-03-16 DIAGNOSIS — R7303 Prediabetes: Secondary | ICD-10-CM

## 2022-03-16 DIAGNOSIS — Z6841 Body Mass Index (BMI) 40.0 and over, adult: Secondary | ICD-10-CM

## 2022-03-16 DIAGNOSIS — E7849 Other hyperlipidemia: Secondary | ICD-10-CM

## 2022-03-16 DIAGNOSIS — R0602 Shortness of breath: Secondary | ICD-10-CM

## 2022-03-16 MED ORDER — TIRZEPATIDE 5 MG/0.5ML ~~LOC~~ SOAJ: 5.0000 mg | SUBCUTANEOUS | 0 refills | Status: DC

## 2022-03-17 LAB — LIPID PANEL
Chol/HDL Ratio: 4.2 ratio (ref 0.0–4.4)
Cholesterol, Total: 137 mg/dL (ref 100–199)
HDL: 33 mg/dL — ABNORMAL LOW (ref >39)
LDL Chol Calc (NIH): 84 mg/dL (ref 0–99)
Triglycerides: 107 mg/dL (ref 0–149)
VLDL Cholesterol Cal: 20 mg/dL (ref 5–40)

## 2022-03-17 LAB — COMPREHENSIVE METABOLIC PANEL
ALT: 9 IU/L (ref 0–32)
AST: 12 IU/L (ref 0–40)
Albumin/Globulin Ratio: 1.6 (ref 1.2–2.2)
Albumin: 4.5 g/dL (ref 3.9–4.9)
Alkaline Phosphatase: 92 IU/L (ref 44–121)
BUN/Creatinine Ratio: 9 (ref 9–23)
BUN: 8 mg/dL (ref 6–20)
Bilirubin Total: 0.4 mg/dL (ref 0.0–1.2)
CO2: 23 mmol/L (ref 20–29)
Calcium: 9.7 mg/dL (ref 8.7–10.2)
Chloride: 98 mmol/L (ref 96–106)
Creatinine, Ser: 0.86 mg/dL (ref 0.57–1.00)
Globulin, Total: 2.9 g/dL (ref 1.5–4.5)
Glucose: 96 mg/dL (ref 70–99)
Potassium: 4 mmol/L (ref 3.5–5.2)
Sodium: 136 mmol/L (ref 134–144)
Total Protein: 7.4 g/dL (ref 6.0–8.5)
eGFR: 90 mL/min/{1.73_m2} (ref >59)

## 2022-03-17 LAB — HEMOGLOBIN A1C
Est. average glucose Bld gHb Est-mCnc: 111 mg/dL
Hgb A1c MFr Bld: 5.5 % (ref 4.8–5.6)

## 2022-03-17 LAB — INSULIN, RANDOM: INSULIN: 19 u[IU]/mL (ref 2.6–24.9)

## 2022-03-17 LAB — VITAMIN D 25 HYDROXY (VIT D DEFICIENCY, FRACTURES): Vit D, 25-Hydroxy: 36 ng/mL (ref 30.0–100.0)

## 2022-03-18 MED ORDER — VITAMIN D (ERGOCALCIFEROL) 1.25 MG (50000 UNIT) PO CAPS: 50000.0000 [IU] | ORAL_CAPSULE | ORAL | 0 refills | Status: DC

## 2022-03-18 NOTE — Progress Notes (Signed)
Chief Complaint:   OBESITY Jacqueline Collins is here to discuss her progress with her obesity treatment plan along with follow-up of her obesity related diagnoses. Jacqueline Collins is on the Category 3 Plan with breakfast options and states she is following her eating plan approximately 100% of the time. Jacqueline Collins states she is walking for 60 minutes 7 times per week.  Today's visit was #: 13 Starting weight: 391 lbs Starting date: 01/31/2020 Today's weight: 348 lbs Today's date: 03/16/2022 Total lbs lost to date: 43 lbs Total lbs lost since last in-office visit: 9 lbs  Interim History: Jacqueline Collins has lost a large amount of fat mass and gained muscle mass since her last office visit - she did great!. She will be seeing a new PCP in the near future. Jacqueline Collins was provided with out Level of Support hand out.  Subjective:   1. SOB (shortness of breath) on exertion and Fatigue IC was repeated today and it showed an increase in REE; from 2010 in 01/2020 to 2059 now (reviewed with Jacqueline Collins). This is excellent .  2. Prediabetes Jacqueline Collins has a diagnosis of prediabetes based on her elevated HgA1c and was informed this puts her at greater risk of developing diabetes. She continues to work on diet and exercise to decrease her risk of diabetes. She denies nausea or hypoglycemia.  Lab Results  Component Value Date   HGBA1C 5.5 03/16/2022   Lab Results  Component Value Date   INSULIN 19.0 03/16/2022   3. Other hyperlipidemia Jacqueline Collins has hyperlipidemia and has been trying to improve her cholesterol levels with intensive lifestyle modifications including a low saturated fat diet, exercise, and weight loss. She denies any chest pain, claudication, or myalgias.  Lab Results  Component Value Date   ALT 9 03/16/2022   AST 12 03/16/2022   ALKPHOS 92 03/16/2022   BILITOT 0.4 03/16/2022   Lab Results  Component Value Date   CHOL 137 03/16/2022   HDL 33 (L) 03/16/2022   LDLCALC 84 03/16/2022   TRIG 107 03/16/2022   CHOLHDL  4.2 03/16/2022    4. Vitamin D deficiency She is currently taking prescription vitamin D 50,000 IU each week. She denies nausea, vomiting or muscle weakness.  Lab Results  Component Value Date   VD25OH 36.0 03/16/2022   VD25OH 27.5 (L) 08/15/2021   VD25OH 15.8 (L) 02/23/2021   5. Other Depression, with emotional eating Jacqueline Collins has good control of her emotional eating and no concerns. She reports no depression symptoms and her energy level is good, as well as sleeping well.  Assessment/Plan:   Orders Placed This Encounter  Procedures   Comprehensive metabolic panel   Hemoglobin A1c   Insulin, random   VITAMIN D 25 Hydroxy (Vit-D Deficiency, Fractures)   Lipid panel    Medications Discontinued During This Encounter  Medication Reason   tirzepatide (MOUNJARO) 5 MG/0.5ML Pen Reorder   Vitamin D, Ergocalciferol, (DRISDOL) 1.25 MG (50000 UNIT) CAPS capsule Reorder     Meds ordered this encounter  Medications   tirzepatide (MOUNJARO) 5 MG/0.5ML Pen    Sig: Inject 5 mg into the skin once a week.    Dispense:  2 mL    Refill:  0   Vitamin D, Ergocalciferol, (DRISDOL) 1.25 MG (50000 UNIT) CAPS capsule    Sig: Take 1 capsule (50,000 Units total) by mouth once a week.    Dispense:  12 capsule    Refill:  0     1. SOB (shortness of breath) on exertion  No need to change current meal plan, will continue Category 3 with breakfast options.  2. Prediabetes Jacqueline Collins will continue to work on weight loss, exercise, and decreasing simple carbohydrates to help decrease the risk of diabetes. Will have labs at next office visit; A1c and Fasting Insulin, - tirzepatide (MOUNJARO) 5 MG/0.5ML Pen; Inject 5 mg into the skin once a week.  Dispense: 2 mL; Refill: 0 - Hemoglobin A1c - Insulin, random  3. Other hyperlipidemia Cardiovascular risk and specific lipid/LDL goals reviewed.  We discussed several lifestyle modifications today and Jacqueline Collins will continue to work on diet, exercise and weight loss  efforts. Orders and follow up as documented in patient record. Will have labs at next office visit; CMP and Lipid panel.  Counseling Intensive lifestyle modifications are the first line treatment for this issue. Dietary changes: Increase soluble fiber. Decrease simple carbohydrates. Exercise changes: Moderate to vigorous-intensity aerobic activity 150 minutes per week if tolerated. Lipid-lowering medications: see documented in medical record. - Comprehensive metabolic panel - Lipid panel  4. Vitamin D deficiency Low Vitamin D level contributes to fatigue and are associated with obesity, breast, and colon cancer. She agrees to continue to take prescription Vitamin D @50 ,000 IU every week and will follow-up for routine testing of Vitamin D, at least 2-3 times per year to avoid over-replacement. Will have labs at next office and will refill Vitamin D as follows: - Vitamin D, Ergocalciferol, (DRISDOL) 1.25 MG (50000 UNIT) CAPS capsule; Take 1 capsule (50,000 Units total) by mouth every 7 (seven) days.  Dispense: 12 capsule; Refill: 0  - VITAMIN D 25 Hydroxy (Vit-D Deficiency, Fractures)  5. Other Depression, with emotional eating - buPROPion ER (WELLBUTRIN SR) 100 MG 12 hr tablet; Take 1 tablet (100 mg total) by mouth 2 (two) times daily.  Dispense: 60 tablet; Refill: 0 6. Obesity, current BMI 63.7 Jacqueline Collins is currently in the action stage of change. As such, her goal is to continue with weight loss efforts. She has agreed to the Category 3 Plan with breakfast options.  Exercise goals: As is.  Behavioral modification strategies: increasing lean protein intake, decreasing simple carbohydrates, increasing water intake, and planning for success.  Jacqueline Collins has agreed to follow-up with our clinic in 3-4 weeks. She was informed of the importance of frequent follow-up visits to maximize her success with intensive lifestyle modifications for her multiple health conditions.   Jacqueline Collins was informed we would  discuss her lab results at her next visit unless there is a critical issue that needs to be addressed sooner. Jacqueline Collins agreed to keep her next visit at the agreed upon time to discuss these results.  Objective:   Blood pressure 122/81, pulse 96, temperature 98.1 F (36.7 C), height 5\' 2"  (1.575 m), weight (!) 348 lb (157.9 kg), SpO2 98 %. Body mass index is 63.65 kg/m.  General: Cooperative, alert, well developed, in no acute distress. HEENT: Conjunctivae and lids unremarkable. Cardiovascular: Regular rhythm.  Lungs: Normal work of breathing. Neurologic: No focal deficits.   Lab Results  Component Value Date   CREATININE 0.86 03/16/2022   BUN 8 03/16/2022   NA 136 03/16/2022   K 4.0 03/16/2022   CL 98 03/16/2022   CO2 23 03/16/2022   Lab Results  Component Value Date   ALT 9 03/16/2022   AST 12 03/16/2022   ALKPHOS 92 03/16/2022   BILITOT 0.4 03/16/2022   Lab Results  Component Value Date   HGBA1C 5.5 03/16/2022   HGBA1C 5.6 08/15/2021   HGBA1C  6.1 (H) 02/23/2021   HGBA1C 5.9 (A) 01/06/2020   HGBA1C 5.9 01/06/2020   HGBA1C 5.9 01/06/2020   HGBA1C 5.9 01/06/2020   Lab Results  Component Value Date   INSULIN 19.0 03/16/2022   Lab Results  Component Value Date   TSH 3.340 08/15/2021   Lab Results  Component Value Date   CHOL 137 03/16/2022   HDL 33 (L) 03/16/2022   LDLCALC 84 03/16/2022   TRIG 107 03/16/2022   CHOLHDL 4.2 03/16/2022   Lab Results  Component Value Date   VD25OH 36.0 03/16/2022   VD25OH 27.5 (L) 08/15/2021   VD25OH 15.8 (L) 02/23/2021   Lab Results  Component Value Date   WBC 8.0 08/15/2021   HGB 13.6 08/15/2021   HCT 40.5 08/15/2021   MCV 94 08/15/2021   PLT 369 08/15/2021   Lab Results  Component Value Date   IRON 84 01/13/2020   TIBC 353 01/13/2020   FERRITIN 82 01/13/2020   Attestation Statements:   Reviewed by clinician on day of visit: allergies, medications, problem list, medical history, surgical history, family history,  social history, and previous encounter notes.  Time spent on visit including pre-visit chart review and post-visit care and charting was 40 minutes.   ILennette Bihari, CMA, am acting as transcriptionist for Dr. Raliegh Scarlet, DO.   I have reviewed the above documentation for accuracy and completeness, and I agree with the above. Marjory Sneddon, D.O.  The [place] was signed into law in 2016 which includes the topic of electronic health records.  This provides immediate access to information in MyChart.  This includes consultation notes, operative notes, office notes, lab results and pathology reports.  If you have any questions about what you read please let us know at your next visit so we can discuss your concerns and take corrective action if need be.  We are right here with you.

## 2022-03-20 ENCOUNTER — Ambulatory Visit: Payer: 59 | Admitting: Internal Medicine

## 2022-03-20 ENCOUNTER — Ambulatory Visit
Admission: RE | Admit: 2022-03-20 | Discharge: 2022-03-20 | Disposition: A | Discharge status: Home / Self Care | Payer: 59 | Source: Ambulatory Visit | Attending: Podiatry | Admitting: Podiatry

## 2022-03-20 DIAGNOSIS — S86302S Unspecified injury of muscle(s) and tendon(s) of peroneal muscle group at lower leg level, left leg, sequela: Secondary | ICD-10-CM

## 2022-03-27 ENCOUNTER — Other Ambulatory Visit: Payer: Self-pay | Admitting: Podiatry

## 2022-03-27 ENCOUNTER — Other Ambulatory Visit: Payer: Self-pay | Admitting: Internal Medicine

## 2022-03-27 DIAGNOSIS — E876 Hypokalemia: Secondary | ICD-10-CM

## 2022-04-07 ENCOUNTER — Encounter: Payer: Self-pay | Admitting: Family Medicine

## 2022-04-07 ENCOUNTER — Ambulatory Visit: Payer: 59 | Admitting: Family Medicine

## 2022-04-10 ENCOUNTER — Ambulatory Visit (INDEPENDENT_AMBULATORY_CARE_PROVIDER_SITE_OTHER): Payer: 59 | Admitting: Family Medicine

## 2022-04-14 ENCOUNTER — Telehealth: Payer: Self-pay | Admitting: Podiatry

## 2022-04-14 NOTE — Telephone Encounter (Signed)
I had sent a mychart message to this patient with the below message. She has not read it. Can someone please call her with the results? Thanks! ---  I have reviewed your MRI. The tendons are intact. There is likely an old sprain of the ankle. There is no significant arthritis. I do think that this could lead to some instability of the ankle joint but it typically should not cause some of the tingling, burning sensations. Have they contacted your about the referral to neurology or rheumatology? For the ankle I think that doing some formal physical therapy would be a good idea. I am happy to also schedule a virtual/telephone visit to further discuss or have you come in to further discuss.    Dr. Jacqualyn Posey

## 2022-04-18 ENCOUNTER — Encounter: Payer: Self-pay | Admitting: Family Medicine

## 2022-04-18 ENCOUNTER — Ambulatory Visit: Payer: 59 | Admitting: Family Medicine

## 2022-04-18 VITALS — BP 128/66 | HR 86 | Temp 97.9°F | Wt 347.6 lb

## 2022-04-18 DIAGNOSIS — Z6841 Body Mass Index (BMI) 40.0 and over, adult: Secondary | ICD-10-CM

## 2022-04-18 DIAGNOSIS — J039 Acute tonsillitis, unspecified: Secondary | ICD-10-CM

## 2022-04-18 DIAGNOSIS — G43809 Other migraine, not intractable, without status migrainosus: Secondary | ICD-10-CM

## 2022-04-18 DIAGNOSIS — G4733 Obstructive sleep apnea (adult) (pediatric): Secondary | ICD-10-CM

## 2022-04-18 DIAGNOSIS — J4530 Mild persistent asthma, uncomplicated: Secondary | ICD-10-CM

## 2022-04-18 DIAGNOSIS — G5603 Carpal tunnel syndrome, bilateral upper limbs: Secondary | ICD-10-CM

## 2022-04-18 DIAGNOSIS — I10 Essential (primary) hypertension: Secondary | ICD-10-CM | POA: Diagnosis not present

## 2022-04-18 DIAGNOSIS — K047 Periapical abscess without sinus: Secondary | ICD-10-CM

## 2022-04-18 MED ORDER — PENICILLIN V POTASSIUM 500 MG PO TABS: 500.0000 mg | ORAL_TABLET | Freq: Three times a day (TID) | ORAL | 0 refills | Status: DC

## 2022-04-18 MED ORDER — BUDESONIDE-FORMOTEROL FUMARATE 160-4.5 MCG/ACT IN AERO: 2.0000 | INHALATION_SPRAY | Freq: Two times a day (BID) | RESPIRATORY_TRACT | 3 refills | Status: DC

## 2022-04-18 MED ORDER — ALBUTEROL SULFATE HFA 108 (90 BASE) MCG/ACT IN AERS: 1.0000 | INHALATION_SPRAY | Freq: Four times a day (QID) | RESPIRATORY_TRACT | 3 refills | Status: DC | PRN

## 2022-04-18 MED ORDER — FLUCONAZOLE 150 MG PO TABS: ORAL_TABLET | ORAL | 6 refills | Status: DC

## 2022-04-18 NOTE — Patient Instructions (Addendum)
Referrals placed as we discussed.  I have sent in medications including inhaler for asthma.  Follow up in 3 months.  Take care  Dr. Lacinda Axon

## 2022-04-18 NOTE — Telephone Encounter (Signed)
Left a voicemail for the patient to call back. Looked at the referrals for rheumatology and neurology and the patient has a bad debt and has to pay the debt before she can be seen with Neurology. Rheumatology has not reached contacted the patient yet.

## 2022-04-19 DIAGNOSIS — G5603 Carpal tunnel syndrome, bilateral upper limbs: Secondary | ICD-10-CM | POA: Insufficient documentation

## 2022-04-19 DIAGNOSIS — K047 Periapical abscess without sinus: Secondary | ICD-10-CM | POA: Insufficient documentation

## 2022-04-19 DIAGNOSIS — J45909 Unspecified asthma, uncomplicated: Secondary | ICD-10-CM | POA: Insufficient documentation

## 2022-04-19 NOTE — Assessment & Plan Note (Signed)
Endorses compliance.  Continue use of CPAP.

## 2022-04-19 NOTE — Assessment & Plan Note (Addendum)
Uncontrolled.  Albuterol refilled.  Starting on Symbicort.

## 2022-04-19 NOTE — Assessment & Plan Note (Signed)
Started on penicillin.  Needs to see dentist.

## 2022-04-19 NOTE — Assessment & Plan Note (Signed)
Referring to orthopedics. 

## 2022-04-19 NOTE — Progress Notes (Signed)
Subjective:  Patient ID: Jacqueline Collins, female    DOB: 03-04-1986  Age: 36 y.o. MRN: [id number]  CC: Chief Complaint  Patient presents with   Establish Care    Pt here to est care. Former pt of Dr.Patel. Pt reports fluctuation pulse rate    HPI:  36 year old female with morbid obesity, migraine, hypertension, OSA, asthma, GERD, chronic constipation, anxiety and depression presents to establish care.  Patient has multiple concerns/issues at this time.  Patient would like to discuss referral to bariatric surgery as well as types of bariatric surgery.  She is ready to get the ball rolling to aid in weight loss.  Patient also reports bilateral tingling in her lower arms and digits.  She states that this has been going on for nearly a year.  It is quite troublesome for her.  Denies pain.  She states that she previously worked in a very physical job requiring a lot of use of her hands with repetitive activities. She also states that her migraines are uncontrolled.  She is on [place] as well as [employer].    Patient has OSA.  Endorses compliance with CPAP.  Patient states that she is using albuterol twice daily.  She is on no controller medication for her asthma.  Additionally, she states that she is having issues with a tooth.  Would like refill on penicillin.  Is awaiting appointment with dentist.  Lastly, patient states that she needs a refill on Diflucan which she uses intermittently for fungal/yeast infections particularly in the genital region.  Patient Active Problem List   Diagnosis Date Noted   Asthma 04/19/2022   Dental infection 04/19/2022   Bilateral carpal tunnel syndrome 04/19/2022   Anxiety with depression 11/10/2020   Family history of colon cancer in mother 10/04/2020   Chronic idiopathic constipation 08/12/2020   Lower extremity edema 08/12/2020   OSA on CPAP 05/22/2020   Vitamin D deficiency 01/06/2020   Gastroesophageal reflux disease 01/06/2020   Migraine 10/01/2019    Nicotine abuse 10/01/2019   Class 3 severe obesity with serious comorbidity and body mass index (BMI) of 60.0 to 69.9 in adult [facility]) 10/01/2019   Essential hypertension 10/01/2019    Social Hx   Social History   Socioeconomic History   Marital status: Significant Other    Spouse name: Not on file   Number of children: Not on file   Years of education: Not on file   Highest education level: Some college, no degree  Occupational History   Occupation: Biochemist, clinical  Tobacco Use   Smoking status: Former    Types: E-cigarettes   Smokeless tobacco: Never  Scientific laboratory technician Use: Every day   Substances: Nicotine, CBD, Flavoring  Substance and Sexual Activity   Alcohol use: Yes    Comment: occ   Drug use: Yes    Frequency: 1.0 times per week    Types: Other-see comments    Comment: smokes CBD for pain as needed   Sexual activity: Yes    Birth control/protection: None    Comment: has female partner  Other Topics Concern   Not on file  Social History Narrative   Lives with girlfriend [employer]ear Clock      Enjoys: likes to travel, not very outdoorsy, did like sports, close with family      Diet: eats all food-snacks alot   Caffeine: green tea, detox tea, rare soda, some coffee at [employer]: 3-4 bottles daily  Wears seat belt   Does not use phone while driving   Smoke detectors at home    Right handed   Social Determinants of Health   Financial Resource Strain: High Risk (04/11/2021)   Overall Financial Resource Strain (CARDIA)    Difficulty of Paying Living Expenses: Hard  Food Insecurity: Food Insecurity Present (04/11/2021)   Hunger Vital Sign    Worried About Running Out of Food in the Last Year: Sometimes true    Ran Out of Food in the Last Year: Sometimes true  Transportation Needs: No Transportation Needs (04/11/2021)   PRAPARE - Administrator, Civil Service (Medical): No    Lack of Transportation (Non-Medical): No  Physical  Activity: Inactive (10/05/2021)   Exercise Vital Sign    Days of Exercise per Week: 0 days    Minutes of Exercise per Session: 0 min  Stress: Stress Concern Present (10/05/2021)   [employer] of Occupational Health - Occupational Stress Questionnaire    Feeling of Stress : Rather much  Social Connections: Moderately Isolated (04/11/2021)   Social Connection and Isolation Panel [NHANES]    Frequency of Communication with Friends and Family: Three times a week    Frequency of Social Gatherings with Friends and Family: Once a week    Attends Religious Services: Never    Database administrator or Organizations: No    Attends Engineer, structural: Never    Marital Status: Living with partner    Review of Systems Per HPI  Objective:  BP 128/66   Pulse 86   Temp 97.9 F (36.6 C)   Wt (!) 347 lb 9.6 oz (157.7 kg)   SpO2 98%   BMI 63.58 kg/m      04/18/2022    3:15 PM 03/16/2022    9:00 AM 02/22/2022   12:00 PM  BP/Weight  Systolic BP 128 122 120  Diastolic BP 66 81 80  Wt. (Lbs) 347.6 348 357  BMI 63.58 kg/m2 63.65 kg/m2 65.3 kg/m2    Physical Exam  Lab Results  Component Value Date   WBC 8.0 08/15/2021   HGB 13.6 08/15/2021   HCT 40.5 08/15/2021   PLT 369 08/15/2021   GLUCOSE 96 03/16/2022   CHOL 137 03/16/2022   TRIG 107 03/16/2022   HDL 33 (L) 03/16/2022   LDLCALC 84 03/16/2022   ALT 9 03/16/2022   AST 12 03/16/2022   NA 136 03/16/2022   K 4.0 03/16/2022   CL 98 03/16/2022   CREATININE 0.86 03/16/2022   BUN 8 03/16/2022   CO2 23 03/16/2022   TSH 3.340 08/15/2021   HGBA1C 5.5 03/16/2022     Assessment & Plan:   Problem List Items Addressed This Visit       Cardiovascular and Mediastinum   Migraine    Referring to neurology.  She is already on [employer] as well as Ubrelvy.  Would benefit from seeing neurology as they can discuss other therapeutic options including Botox.      Relevant Orders   Ambulatory referral to Neurology    Essential hypertension    BP is well controlled.  Continue Lasix.        Respiratory   OSA on CPAP    Endorses compliance.  Continue use of CPAP.      Asthma    Uncontrolled.  Albuterol refilled.  Starting on Symbicort.      Relevant Medications   albuterol (VENTOLIN HFA) 108 (90 Base) MCG/ACT inhaler   budesonide-formoterol (  SYMBICORT) 160-4.5 MCG/ACT inhaler     Digestive   Dental infection    Started on penicillin.  Needs to see dentist.      Relevant Medications   fluconazole (DIFLUCAN) 150 MG tablet   penicillin v potassium (VEETID) 500 MG tablet     Nervous and Auditory   Bilateral carpal tunnel syndrome    Referring to orthopedics.       Relevant Orders   Ambulatory referral to Orthopedic Surgery     Other   Class 3 severe obesity with serious comorbidity and body mass index (BMI) of 60.0 to 69.9 in adult [facility]) - Primary    Referring to bariatric surgery.      Relevant Orders   Amb Referral to Bariatric Surgery    Meds ordered this encounter  Medications   albuterol (VENTOLIN HFA) 108 (90 Base) MCG/ACT inhaler    Sig: Inhale 1-2 puffs into the lungs every 6 (six) hours as needed for wheezing or shortness of breath.    Dispense:  1 each    Refill:  3   fluconazole (DIFLUCAN) 150 MG tablet    Sig: 1 tablet once. Additional tablet in 3 days. For recurrent yeast infections.    Dispense:  2 tablet    Refill:  6   penicillin v potassium (VEETID) 500 MG tablet    Sig: Take 1 tablet (500 mg total) by mouth 3 (three) times daily.    Dispense:  30 tablet    Refill:  0   budesonide-formoterol (SYMBICORT) 160-4.5 MCG/ACT inhaler    Sig: Inhale 2 puffs into the lungs 2 (two) times daily.    Dispense:  1 each    Refill:  3    Follow-up:  3 months  Dellar Traber Adriana Simas DO [facility]

## 2022-04-19 NOTE — Assessment & Plan Note (Addendum)
BP is well controlled.  Continue Lasix.

## 2022-04-19 NOTE — Assessment & Plan Note (Signed)
Referring to neurology.  She is already on [employer] as well as Ubrelvy.  Would benefit from seeing neurology as they can discuss other therapeutic options including Botox.

## 2022-04-19 NOTE — Assessment & Plan Note (Signed)
Referring to bariatric surgery. 

## 2022-04-28 ENCOUNTER — Ambulatory Visit (INDEPENDENT_AMBULATORY_CARE_PROVIDER_SITE_OTHER): Payer: 59 | Admitting: Podiatry

## 2022-04-28 DIAGNOSIS — M25372 Other instability, left ankle: Secondary | ICD-10-CM

## 2022-04-28 DIAGNOSIS — M25572 Pain in left ankle and joints of left foot: Secondary | ICD-10-CM

## 2022-04-28 DIAGNOSIS — G629 Polyneuropathy, unspecified: Secondary | ICD-10-CM

## 2022-04-28 DIAGNOSIS — M2550 Pain in unspecified joint: Secondary | ICD-10-CM

## 2022-04-28 NOTE — Progress Notes (Unsigned)
Not heard from referrals

## 2022-05-02 ENCOUNTER — Ambulatory Visit (INDEPENDENT_AMBULATORY_CARE_PROVIDER_SITE_OTHER): Payer: 59 | Admitting: Orthopedic Surgery

## 2022-05-02 ENCOUNTER — Encounter: Payer: Self-pay | Admitting: Orthopedic Surgery

## 2022-05-02 ENCOUNTER — Telehealth: Payer: Self-pay | Admitting: Physical Medicine and Rehabilitation

## 2022-05-02 VITALS — BP 149/91 | HR 88 | Ht 62.0 in | Wt 348.0 lb

## 2022-05-02 DIAGNOSIS — R202 Paresthesia of skin: Secondary | ICD-10-CM

## 2022-05-02 NOTE — Telephone Encounter (Signed)
Pt need a call about her referral. Please call [phone]

## 2022-05-02 NOTE — Telephone Encounter (Signed)
Spoke with patient and scheduled NCS for 05/05/22

## 2022-05-02 NOTE — Patient Instructions (Signed)
[facility]  Office address is [address] [place] The phone number is [phone]  The office will call you with an appointment Dr. Newton  

## 2022-05-03 NOTE — Progress Notes (Signed)
Orthopaedic Clinic Return  Assessment: Jacqueline Collins is a 36 y.o. female with the following: Bilateral hand, numbness and tingling; carpal tunnel syndrome versus cervical radiculopathy  Plan: Pain and symptoms of unclear etiology at this point.  She was referred to clinic today for carpal tunnel syndrome, but there could be a component of this coming from her cervical spine.  We will obtain EMGs bilaterally, to further elucidate the source of her problem.  She may have some neuropathy as well.  Once the results are available, we will discuss the findings in clinic, including the neck steps.  The patient meets the AMA guidelines for Morbid obesity with BMI > 40.  The patient has been counseled on weight loss.    Follow-up: Return for After EMG.   Subjective:  Chief Complaint  Patient presents with   Hand Pain    Bilateral     History of Present Illness: Jacqueline Collins is a 36 y.o. female who returns to clinic for evaluation of bilateral hand pain.  She was sent to clinic today by Kermit Balo, MD for evaluation of her symptoms.  She has some associated numbness and tingling.  She states that the numbness is in her entire hand.  She wears wrist braces on a regular basis.  She also notes pain in her wrists.  Numbness wakes her up at night.  Review of Systems: No fevers or chills No numbness or tingling No chest pain No shortness of breath No bowel or bladder dysfunction No GI distress No headaches   Objective: BP (!) 149/91   Pulse 88   Ht 5\' 2"  (1.575 m)   Wt (!) 348 lb (157.9 kg)   BMI 63.65 kg/m   Physical Exam:  Alert and oriented.  No acute distress.  Bilateral hands without atrophy.  No numbness is appreciated in clinic today.  Mildly positive Tinel's and Phalen's bilaterally.  Fingers are warm and well perfused.  2+ radial pulses  IMAGING: I personally ordered and reviewed the following images:  No new imaging obtained today.   Mordecai Rasmussen,  MD 05/03/2022 9:50 AM

## 2022-05-04 ENCOUNTER — Other Ambulatory Visit: Payer: Self-pay | Admitting: Internal Medicine

## 2022-05-04 DIAGNOSIS — J329 Chronic sinusitis, unspecified: Secondary | ICD-10-CM

## 2022-05-05 ENCOUNTER — Ambulatory Visit (INDEPENDENT_AMBULATORY_CARE_PROVIDER_SITE_OTHER): Payer: 59 | Admitting: Physical Medicine and Rehabilitation

## 2022-05-05 DIAGNOSIS — R202 Paresthesia of skin: Secondary | ICD-10-CM | POA: Diagnosis not present

## 2022-05-05 NOTE — Progress Notes (Unsigned)
Numeric Pain Rating Scale and Functional Assessment Average Pain 4   In the last MONTH (on 0-10 scale) has pain interfered with the following?  1. General activity like being  able to carry out your everyday physical activities such as walking, climbing stairs, carrying groceries, or moving a chair?  Rating(10)   Right handed. Pain and numbness in both arms and hands, worse on left. When active, like lifting or bending, she feels like her chest gets tight and she has to "pop it out"

## 2022-05-08 ENCOUNTER — Telehealth: Payer: 59 | Admitting: Family Medicine

## 2022-05-08 ENCOUNTER — Ambulatory Visit (INDEPENDENT_AMBULATORY_CARE_PROVIDER_SITE_OTHER): Payer: 59 | Admitting: Family Medicine

## 2022-05-08 ENCOUNTER — Encounter (INDEPENDENT_AMBULATORY_CARE_PROVIDER_SITE_OTHER): Payer: Self-pay | Admitting: Family Medicine

## 2022-05-08 VITALS — BP 120/88 | HR 82 | Temp 97.8°F | Ht 62.0 in | Wt 338.8 lb

## 2022-05-08 DIAGNOSIS — R7303 Prediabetes: Secondary | ICD-10-CM | POA: Diagnosis not present

## 2022-05-08 DIAGNOSIS — E559 Vitamin D deficiency, unspecified: Secondary | ICD-10-CM | POA: Diagnosis not present

## 2022-05-08 DIAGNOSIS — E7849 Other hyperlipidemia: Secondary | ICD-10-CM | POA: Diagnosis not present

## 2022-05-08 DIAGNOSIS — K5904 Chronic idiopathic constipation: Secondary | ICD-10-CM | POA: Diagnosis not present

## 2022-05-08 DIAGNOSIS — Z6841 Body Mass Index (BMI) 40.0 and over, adult: Secondary | ICD-10-CM

## 2022-05-08 DIAGNOSIS — E669 Obesity, unspecified: Secondary | ICD-10-CM

## 2022-05-08 MED ORDER — TIRZEPATIDE 5 MG/0.5ML ~~LOC~~ SOAJ: 5.0000 mg | SUBCUTANEOUS | 0 refills | Status: DC

## 2022-05-08 MED ORDER — LINACLOTIDE 72 MCG PO CAPS: ORAL_CAPSULE | ORAL | 0 refills | Status: DC

## 2022-05-08 MED ORDER — VITAMIN D (ERGOCALCIFEROL) 1.25 MG (50000 UNIT) PO CAPS: ORAL_CAPSULE | ORAL | 0 refills | Status: DC

## 2022-05-08 NOTE — Progress Notes (Signed)
Jacqueline Collins - 36 y.o. female MRN [id number]  Date of birth: Nov 18, 1985  Office Visit Note: Visit Date: 05/05/2022 PCP: Coral Spikes, DO Referred by: Mordecai Rasmussen, MD  Subjective: Chief Complaint  Patient presents with   Right Wrist - Pain, Numbness   Left Wrist - Pain, Numbness   HPI:  Jacqueline Collins is a 36 y.o. female who comes in today at the request of Dr. Larena Glassman for electrodiagnostic study of the Bilateral upper extremities.  Patient is Right hand dominant.  Chronic several year history of 10 out of 10 functional limitation and 4 out of 10 pain with pain and numbness in a nondermatomal fashion in both arms and hands worse left than right.  She reports particular pain when being active like lifting or bending.  She reports doing a lot of heavy work that she can no longer do because of her hands and arms.  She also reports several knots in the upper arm and several knots in the forearms.  Most recently she has had some chest tightness and feels like she has to pop things out.  Seems like she gets some triggering of her fingers at times.   ROS Otherwise per HPI.  Assessment & Plan: Visit Diagnoses:    ICD-10-CM   1. Paresthesia of skin  R20.2 NCV with EMG (electromyography)      Plan: Impression: Essentially NORMAL electrodiagnostic study of both upper limbs.  There is no significant electrodiagnostic evidence of nerve entrapment, brachial plexopathy, cervical radiculopathy or generalized peripheral neuropathy.    As you know, purely sensory or demyelinating radiculopathies and chemical radiculitis may not be detected with this particular electrodiagnostic study. **This electrodiagnostic study cannot rule out small fiber polyneuropathy and dysesthesias from central pain syndromes such as stroke or central pain sensitization syndromes such as fibromyalgia.  Myotomal referral pain from trigger points is also not excluded.  Recommendations: 1.  Follow-up with referring  physician. 2.  Continue current management of symptoms.  Meds & Orders: No orders of the defined types were placed in this encounter.   Orders Placed This Encounter  Procedures   NCV with EMG (electromyography)    Follow-up: Return in about 2 weeks (around 05/19/2022) for Larena Glassman, MD.   Procedures: No procedures performed  EMG & NCV Findings: Evaluation of the left median (across palm) sensory nerve showed no response (Palm).  The right median (across palm) sensory nerve showed prolonged distal peak latency (Palm, 3.4 ms).  The right ulnar sensory nerve showed reduced amplitude (13.5 V).  All remaining nerves (as indicated in the following tables) were within normal limits.  Left vs. Right side comparison data for the median motor nerve indicates abnormal L-R velocity difference (Elbow-Wrist, 11 m/s).  The ulnar motor nerve indicates abnormal L-R amplitude difference (71.8 %).  All remaining left vs. right side differences were within normal limits.    All examined muscles (as indicated in the following table) showed no evidence of electrical instability.    Impression: Essentially NORMAL electrodiagnostic study of both upper limbs.  There is no significant electrodiagnostic evidence of nerve entrapment, brachial plexopathy, cervical radiculopathy or generalized peripheral neuropathy.    As you know, purely sensory or demyelinating radiculopathies and chemical radiculitis may not be detected with this particular electrodiagnostic study. **This electrodiagnostic study cannot rule out small fiber polyneuropathy and dysesthesias from central pain syndromes such as stroke or central pain sensitization syndromes such as fibromyalgia.  Myotomal referral pain from trigger points  is also not excluded.  Recommendations: 1.  Follow-up with referring physician. 2.  Continue current management of symptoms.  ___________________________ Naaman Plummer FAAPMR Board Certified, American Board of  Physical Medicine and Rehabilitation    Nerve Conduction Studies Anti Sensory Summary Table   Stim Site NR Peak (ms) Norm Peak (ms) P-T Amp (V) Norm P-T Amp Site1 Site2 Delta-P (ms) Dist (cm) Vel (m/s) Norm Vel (m/s)  Left Median Acr Palm Anti Sensory (2nd Digit)  30.4C  Wrist    3.2 <3.6 16.6 >10 Wrist Palm  0.0    Palm *NR  <2.0          Right Median Acr Palm Anti Sensory (2nd Digit)  31.3C  Wrist    3.4 <3.6 66.2 >10 Wrist Palm 0.0 0.0    Palm    *3.4 <2.0 25.8         Left Radial Anti Sensory (Base 1st Digit)  30.2C  Wrist    2.1 <3.1 22.3  Wrist Base 1st Digit 2.1 0.0    Right Radial Anti Sensory (Base 1st Digit)  31.2C  Wrist    1.9 <3.1 33.8  Wrist Base 1st Digit 1.9 0.0    Left Ulnar Anti Sensory (5th Digit)  30.7C  Wrist    3.0 <3.7 35.1 >15.0 Wrist 5th Digit 3.0 14.0 47 >38  Right Ulnar Anti Sensory (5th Digit)  31.5C  Wrist    2.7 <3.7 *13.5 >15.0 Wrist 5th Digit 2.7 14.0 52 >38   Motor Summary Table   Stim Site NR Onset (ms) Norm Onset (ms) O-P Amp (mV) Norm O-P Amp Site1 Site2 Delta-0 (ms) Dist (cm) Vel (m/s) Norm Vel (m/s)  Left Median Motor (Abd Poll Brev)  30.4C  Wrist    3.0 <4.2 10.7 >5 Elbow Wrist 3.4 21.5 63 >50  Elbow    6.4  10.7         Right Median Motor (Abd Poll Brev)  31.4C  Wrist    3.2 <4.2 10.6 >5 Elbow Wrist 3.4 25.0 74 >50  Elbow    6.6  10.5         Left Ulnar Motor (Abd Dig Min)  30.6C  Wrist    2.7 <4.2 11.7 >3 B Elbow Wrist 2.8 19.0 68 >53  B Elbow    5.5  12.3  A Elbow B Elbow 0.8 10.0 125 >53  A Elbow    6.3  12.1         Right Ulnar Motor (Abd Dig Min)  31.3C  Wrist    2.7 <4.2 3.3 >3 B Elbow Wrist 2.8 19.0 68 >53  B Elbow    5.5  10.9  A Elbow B Elbow 0.9 11.0 122 >53  A Elbow    6.4  7.9          EMG   Side Muscle Nerve Root Ins Act Fibs Psw Amp Dur Poly Recrt Int Dennie Bible Comment  Left 1stDorInt Ulnar C8-T1 Nml Nml Nml Nml Nml 0 Nml Nml   Left Abd Poll Brev Median C8-T1 Nml Nml Nml Nml Nml 0 Nml Nml   Left ExtDigCom   Nml  Nml Nml Nml Nml 0 Nml Nml   Left Triceps Radial C6-7-8 Nml Nml Nml Nml Nml 0 Nml Nml   Left Deltoid Axillary C5-6 Nml Nml Nml Nml Nml 0 Nml Nml     Nerve Conduction Studies Anti Sensory Left/Right Comparison   Stim Site L Lat (ms) R Lat (ms) L-R Lat (ms) L  Amp (V) R Amp (V) L-R Amp (%) Site1 Site2 L Vel (m/s) R Vel (m/s) L-R Vel (m/s)  Median Acr Palm Anti Sensory (2nd Digit)  30.4C  Wrist 3.2 3.4 0.2 16.6 66.2 74.9 Wrist Palm     Palm  *3.4   25.8        Radial Anti Sensory (Base 1st Digit)  30.2C  Wrist 2.1 1.9 0.2 22.3 33.8 34.0 Wrist Base 1st Digit     Ulnar Anti Sensory (5th Digit)  30.7C  Wrist 3.0 2.7 0.3 35.1 *13.5 61.5 Wrist 5th Digit 47 52 5   Motor Left/Right Comparison   Stim Site L Lat (ms) R Lat (ms) L-R Lat (ms) L Amp (mV) R Amp (mV) L-R Amp (%) Site1 Site2 L Vel (m/s) R Vel (m/s) L-R Vel (m/s)  Median Motor (Abd Poll Brev)  30.4C  Wrist 3.0 3.2 0.2 10.7 10.6 0.9 Elbow Wrist 63 74 *11  Elbow 6.4 6.6 0.2 10.7 10.5 1.9       Ulnar Motor (Abd Dig Min)  30.6C  Wrist 2.7 2.7 0.0 11.7 3.3 *71.8 B Elbow Wrist 68 68 0  B Elbow 5.5 5.5 0.0 12.3 10.9 11.4 A Elbow B Elbow 125 122 3  A Elbow 6.3 6.4 0.1 12.1 7.9 34.7          Waveforms:                      Clinical History: No specialty comments available.     Objective:  VS:  HT:    WT:   BMI:     BP:   HR: bpm  TEMP: ( )  RESP:  Physical Exam Musculoskeletal:        General: No swelling, tenderness or deformity.     Comments: Inspection reveals no atrophy of the bilateral APB or FDI or hand intrinsics. There is no swelling, color changes, allodynia or dystrophic changes. There is 5 out of 5 strength in the bilateral wrist extension, finger abduction and long finger flexion. There is intact sensation to light touch in all dermatomal and peripheral nerve distributions. There is a negative Hoffmann's test bilaterally.  Skin:    General: Skin is warm and dry.     Findings: No erythema or rash.   Neurological:     General: No focal deficit present.     Mental Status: She is alert and oriented to person, place, and time.     Motor: No weakness or abnormal muscle tone.     Coordination: Coordination normal.  Psychiatric:        Mood and Affect: Mood normal.        Behavior: Behavior normal.      Imaging: No results found.

## 2022-05-08 NOTE — Procedures (Signed)
EMG & NCV Findings: Evaluation of the left median (across palm) sensory nerve showed no response (Palm).  The right median (across palm) sensory nerve showed prolonged distal peak latency (Palm, 3.4 ms).  The right ulnar sensory nerve showed reduced amplitude (13.5 V).  All remaining nerves (as indicated in the following tables) were within normal limits.  Left vs. Right side comparison data for the median motor nerve indicates abnormal L-R velocity difference (Elbow-Wrist, 11 m/s).  The ulnar motor nerve indicates abnormal L-R amplitude difference (71.8 %).  All remaining left vs. right side differences were within normal limits.    All examined muscles (as indicated in the following table) showed no evidence of electrical instability.    Impression: Essentially NORMAL electrodiagnostic study of both upper limbs.  There is no significant electrodiagnostic evidence of nerve entrapment, brachial plexopathy, cervical radiculopathy or generalized peripheral neuropathy.    As you know, purely sensory or demyelinating radiculopathies and chemical radiculitis may not be detected with this particular electrodiagnostic study. **This electrodiagnostic study cannot rule out small fiber polyneuropathy and dysesthesias from central pain syndromes such as stroke or central pain sensitization syndromes such as fibromyalgia.  Myotomal referral pain from trigger points is also not excluded.  Recommendations: 1.  Follow-up with referring physician. 2.  Continue current management of symptoms.  ___________________________ Laurence Spates FAAPMR Board Certified, American Board of Physical Medicine and Rehabilitation    Nerve Conduction Studies Anti Sensory Summary Table   Stim Site NR Peak (ms) Norm Peak (ms) P-T Amp (V) Norm P-T Amp Site1 Site2 Delta-P (ms) Dist (cm) Vel (m/s) Norm Vel (m/s)  Left Median Acr Palm Anti Sensory (2nd Digit)  30.4C  Wrist    3.2 <3.6 16.6 >10 Wrist Palm  0.0    Palm *NR  <2.0           Right Median Acr Palm Anti Sensory (2nd Digit)  31.3C  Wrist    3.4 <3.6 66.2 >10 Wrist Palm 0.0 0.0    Palm    *3.4 <2.0 25.8         Left Radial Anti Sensory (Base 1st Digit)  30.2C  Wrist    2.1 <3.1 22.3  Wrist Base 1st Digit 2.1 0.0    Right Radial Anti Sensory (Base 1st Digit)  31.2C  Wrist    1.9 <3.1 33.8  Wrist Base 1st Digit 1.9 0.0    Left Ulnar Anti Sensory (5th Digit)  30.7C  Wrist    3.0 <3.7 35.1 >15.0 Wrist 5th Digit 3.0 14.0 47 >38  Right Ulnar Anti Sensory (5th Digit)  31.5C  Wrist    2.7 <3.7 *13.5 >15.0 Wrist 5th Digit 2.7 14.0 52 >38   Motor Summary Table   Stim Site NR Onset (ms) Norm Onset (ms) O-P Amp (mV) Norm O-P Amp Site1 Site2 Delta-0 (ms) Dist (cm) Vel (m/s) Norm Vel (m/s)  Left Median Motor (Abd Poll Brev)  30.4C  Wrist    3.0 <4.2 10.7 >5 Elbow Wrist 3.4 21.5 63 >50  Elbow    6.4  10.7         Right Median Motor (Abd Poll Brev)  31.4C  Wrist    3.2 <4.2 10.6 >5 Elbow Wrist 3.4 25.0 74 >50  Elbow    6.6  10.5         Left Ulnar Motor (Abd Dig Min)  30.6C  Wrist    2.7 <4.2 11.7 >3 B Elbow Wrist 2.8 19.0 68 >53  B Elbow  5.5  12.3  A Elbow B Elbow 0.8 10.0 125 >53  A Elbow    6.3  12.1         Right Ulnar Motor (Abd Dig Min)  31.3C  Wrist    2.7 <4.2 3.3 >3 B Elbow Wrist 2.8 19.0 68 >53  B Elbow    5.5  10.9  A Elbow B Elbow 0.9 11.0 122 >53  A Elbow    6.4  7.9          EMG   Side Muscle Nerve Root Ins Act Fibs Psw Amp Dur Poly Recrt Int Fraser Din Comment  Left 1stDorInt Ulnar C8-T1 Nml Nml Nml Nml Nml 0 Nml Nml   Left Abd Poll Brev Median C8-T1 Nml Nml Nml Nml Nml 0 Nml Nml   Left ExtDigCom   Nml Nml Nml Nml Nml 0 Nml Nml   Left Triceps Radial C6-7-8 Nml Nml Nml Nml Nml 0 Nml Nml   Left Deltoid Axillary C5-6 Nml Nml Nml Nml Nml 0 Nml Nml     Nerve Conduction Studies Anti Sensory Left/Right Comparison   Stim Site L Lat (ms) R Lat (ms) L-R Lat (ms) L Amp (V) R Amp (V) L-R Amp (%) Site1 Site2 L Vel (m/s) R Vel (m/s) L-R Vel (m/s)   Median Acr Palm Anti Sensory (2nd Digit)  30.4C  Wrist 3.2 3.4 0.2 16.6 66.2 74.9 Wrist Palm     Palm  *3.4   25.8        Radial Anti Sensory (Base 1st Digit)  30.2C  Wrist 2.1 1.9 0.2 22.3 33.8 34.0 Wrist Base 1st Digit     Ulnar Anti Sensory (5th Digit)  30.7C  Wrist 3.0 2.7 0.3 35.1 *13.5 61.5 Wrist 5th Digit 47 52 5   Motor Left/Right Comparison   Stim Site L Lat (ms) R Lat (ms) L-R Lat (ms) L Amp (mV) R Amp (mV) L-R Amp (%) Site1 Site2 L Vel (m/s) R Vel (m/s) L-R Vel (m/s)  Median Motor (Abd Poll Brev)  30.4C  Wrist 3.0 3.2 0.2 10.7 10.6 0.9 Elbow Wrist 63 74 *11  Elbow 6.4 6.6 0.2 10.7 10.5 1.9       Ulnar Motor (Abd Dig Min)  30.6C  Wrist 2.7 2.7 0.0 11.7 3.3 *71.8 B Elbow Wrist 68 68 0  B Elbow 5.5 5.5 0.0 12.3 10.9 11.4 A Elbow B Elbow 125 122 3  A Elbow 6.3 6.4 0.1 12.1 7.9 34.7          Waveforms:

## 2022-05-09 ENCOUNTER — Encounter: Payer: Self-pay | Admitting: Family Medicine

## 2022-05-09 ENCOUNTER — Ambulatory Visit: Payer: 59 | Admitting: Family Medicine

## 2022-05-09 VITALS — BP 136/72 | Wt 343.0 lb

## 2022-05-09 DIAGNOSIS — R079 Chest pain, unspecified: Secondary | ICD-10-CM | POA: Diagnosis not present

## 2022-05-09 MED ORDER — PENICILLIN V POTASSIUM 500 MG PO TABS: 500.0000 mg | ORAL_TABLET | Freq: Three times a day (TID) | ORAL | 0 refills | Status: AC

## 2022-05-09 NOTE — Patient Instructions (Signed)
This is MSK in nature.  We will fax over documentation to your dentist.  Take care  Dr. Lacinda Axon

## 2022-05-10 DIAGNOSIS — R079 Chest pain, unspecified: Secondary | ICD-10-CM | POA: Insufficient documentation

## 2022-05-10 NOTE — Progress Notes (Signed)
Subjective:  Patient ID: Jacqueline Collins, female    DOB: 1986/02/19  Age: 36 y.o. MRN: [id number]  CC: Chief Complaint  Patient presents with   Follow-up    Pt is having oral surgery in the coming days and oral surgeon is concerned about popping in chest/tightness. Having some shortness of breath.  Pt would like referral to Dr.Doonquah     HPI:  36 year old female with an extensive past medical history as outlined below presents for follow-up.  Patient is due to have dental extractions.  When she was at the dentist she informed that she has had intermittent chest pain.  They advised her that she needs to be seen by her physician so that she can be cleared to have dental procedure.  Patient has intermittent chest tightness particularly centrally.  She has some baseline shortness of breath due to body habitus.  She has times where it pops.  No current chest pain.  No other reported symptoms at this time.  Patient is requesting refill on penicillin for dental infection to cover her until she gets dental work done.  Patient Active Problem List   Diagnosis Date Noted   Chest pain 05/10/2022   Other hyperlipidemia 05/08/2022   Prediabetes 05/08/2022   Asthma 04/19/2022   Dental infection 04/19/2022   Anxiety with depression 11/10/2020   Family history of colon cancer in mother 10/04/2020   Chronic idiopathic constipation 08/12/2020   Lower extremity edema 08/12/2020   OSA on CPAP 05/22/2020   Vitamin D deficiency 01/06/2020   Gastroesophageal reflux disease 01/06/2020   Migraine 10/01/2019   Nicotine abuse 10/01/2019   Class 3 severe obesity with serious comorbidity and body mass index (BMI) of 60.0 to 69.9 in adult [facility]) 10/01/2019   Essential hypertension 10/01/2019    Social Hx   Social History   Socioeconomic History   Marital status: Significant Other    Spouse name: Not on file   Number of children: Not on file   Years of education: Not on file   Highest education level:  Some college, no degree  Occupational History   Occupation: Company secretary  Tobacco Use   Smoking status: Former    Types: E-cigarettes   Smokeless tobacco: Never  Building services engineer Use: Every day   Substances: Nicotine, CBD, Flavoring  Substance and Sexual Activity   Alcohol use: Yes    Comment: occ   Drug use: Yes    Frequency: 1.0 times per week    Types: Other-see comments    Comment: smokes CBD for pain as needed   Sexual activity: Yes    Birth control/protection: None    Comment: has female partner  Other Topics Concern   Not on file  Social History Narrative   Lives with girlfriend [name]      Enjoys: likes to travel, not very outdoorsy, did like sports, close with family      Diet: eats all food-snacks alot   Caffeine: green tea, detox tea, rare soda, some coffee at [employer]: 3-4 bottles daily      Wears seat belt   Does not use phone while driving   Smoke detectors at home    Right handed   Social Determinants of Health   Financial Resource Strain: High Risk (04/11/2021)   Overall Financial Resource Strain (CARDIA)    Difficulty of Paying Living Expenses: Hard  Food Insecurity: Food Insecurity Present (04/11/2021)   Hunger Vital Sign    Worried  About Running Out of Food in the Last Year: Sometimes true    Ran Out of Food in the Last Year: Sometimes true  Transportation Needs: No Transportation Needs (04/11/2021)   PRAPARE - Administrator, Civil Service (Medical): No    Lack of Transportation (Non-Medical): No  Physical Activity: Inactive (10/05/2021)   Exercise Vital Sign    Days of Exercise per Week: 0 days    Minutes of Exercise per Session: 0 min  Stress: Stress Concern Present (10/05/2021)   [employer] of Occupational Health - Occupational Stress Questionnaire    Feeling of Stress : Rather much  Social Connections: Moderately Isolated (04/11/2021)   Social Connection and Isolation Panel [NHANES]    Frequency of  Communication with Friends and Family: Three times a week    Frequency of Social Gatherings with Friends and Family: Once a week    Attends Religious Services: Never    Database administrator or Organizations: No    Attends Banker Meetings: Never    Marital Status: Living with partner    Review of Systems Per HPI  Objective:  BP 136/72   Wt (!) 343 lb (155.6 kg)   BMI 62.74 kg/m      05/09/2022    1:59 PM 05/08/2022   12:23 PM 05/02/2022    2:18 PM  BP/Weight  Systolic BP 136 120 149  Diastolic BP 72 88 91  Wt. (Lbs) 343 338.8 348  BMI 62.74 kg/m2 61.97 kg/m2 63.65 kg/m2    Physical Exam Vitals and nursing note reviewed.  Constitutional:      General: She is not in acute distress.    Appearance: Normal appearance. She is obese.  HENT:     Head: Normocephalic and atraumatic.  Eyes:     General:        Right eye: No discharge.        Left eye: No discharge.     Conjunctiva/sclera: Conjunctivae normal.  Cardiovascular:     Rate and Rhythm: Normal rate and regular rhythm.  Pulmonary:     Effort: Pulmonary effort is normal.     Breath sounds: Normal breath sounds. No wheezing, rhonchi or rales.  Neurological:     Mental Status: She is alert.     Lab Results  Component Value Date   WBC 8.0 08/15/2021   HGB 13.6 08/15/2021   HCT 40.5 08/15/2021   PLT 369 08/15/2021   GLUCOSE 96 03/16/2022   CHOL 137 03/16/2022   TRIG 107 03/16/2022   HDL 33 (L) 03/16/2022   LDLCALC 84 03/16/2022   ALT 9 03/16/2022   AST 12 03/16/2022   NA 136 03/16/2022   K 4.0 03/16/2022   CL 98 03/16/2022   CREATININE 0.86 03/16/2022   BUN 8 03/16/2022   CO2 23 03/16/2022   TSH 3.340 08/15/2021   HGBA1C 5.5 03/16/2022   EKG: Interpretation -normal sinus rhythm with a rate of 70.  Normal axis.  Normal intervals.  Normal EKG.  Assessment & Plan:   Problem List Items Addressed This Visit       Other   Chest pain - Primary    No evidence of cardiac chest pain.  This  is MSK in nature.  Supportive care.  Okay to proceed with dental work.      Relevant Orders   EKG 12-Lead (Completed)    Meds ordered this encounter  Medications   penicillin v potassium (VEETID) 500 MG tablet  Sig: Take 1 tablet (500 mg total) by mouth 3 (three) times daily for 10 days.    Dispense:  30 tablet    Refill:  0   Basel Defalco DO [facility]

## 2022-05-10 NOTE — Assessment & Plan Note (Signed)
No evidence of cardiac chest pain.  This is MSK in nature.  Supportive care.  Okay to proceed with dental work.

## 2022-05-21 NOTE — Progress Notes (Unsigned)
Chief Complaint:   OBESITY Jacqueline Collins is here to discuss her progress with her obesity treatment plan along with follow-up of her obesity related diagnoses. Jacqueline Collins is on the Category 3 Plan and states she is following her eating plan approximately 50% of the time. Jacqueline Collins states she is walking 120 minutes 7 times per week.  Today's visit was #: 14 Starting weight: 391 lbs Starting date: 01/31/2020 Today's weight: 338 lbs                                         Today's date: 05/08/2022 Total lbs lost to date: 53 Total lbs lost since last in-office visit: 10  Interim History: Jacqueline Collins has lost 2 uncles in the past several weeks. She did more stress eating but still fat mass and gained muscle mass. Pt denies issues with meal plan. Here to review labs  Subjective:   1. Prediabetes Discussed labs with patient today. Jacqueline Collins's fasting insulin is elevated at 19.0. Her A1c was 6.1 but has improved to 5.5. She is on Mounjaro 5 mg weekly and denies side effects, except constipation. Pt is eating all her foods on plan.  2. Chronic idiopathic constipation Discussed labs with patient today. Jacqueline Collins is on Linzess, but it is "too strong" to take everyday. She has to be home for the whole day if she takes it, as it causes significantly loose stools.  3. Other hyperlipidemia Discussed labs with patient today. Improved LDL and slightly improved HDL. Medication: Crestor  4. Vitamin D deficiency Discussed labs with patient today. Compliance is great with once weekly Ergocalciferol, and pt is tolerating it well.  Assessment/Plan:  No orders of the defined types were placed in this encounter.   Medications Discontinued During This Encounter  Medication Reason   linaclotide (LINZESS) 290 MCG CAPS capsule Reorder   tirzepatide (MOUNJARO) 5 MG/0.5ML Pen Reorder   Vitamin D, Ergocalciferol, (DRISDOL) 1.25 MG (50000 UNIT) CAPS capsule Reorder     Meds ordered this encounter  Medications   tirzepatide  (MOUNJARO) 5 MG/0.5ML Pen    Sig: Inject 5 mg into the skin once a week.    Dispense:  2 mL    Refill:  0   linaclotide (LINZESS) 72 MCG capsule    Sig: 1-2 cap q am with food    Dispense:  60 capsule    Refill:  0   Vitamin D, Ergocalciferol, (DRISDOL) 1.25 MG (50000 UNIT) CAPS capsule    Sig: 1 po q sun and 1 po q wed    Dispense:  16 capsule    Refill:  0    60 d supply     1. Prediabetes Jacqueline Collins will continue to work on weight loss, exercise, prudent nutritional plan, and decreasing simple carbohydrates to help decrease the risk of diabetes.  Counseling done on labs done.  Handouts: Pre-diabetes; Insulin Resistance  Refill- tirzepatide (MOUNJARO) 5 MG/0.5ML Pen; Inject 5 mg into the skin once a week.  Dispense: 2 mL; Refill: 0  2. Chronic idiopathic constipation Change to decrease Linzess dose with goal of going daily but with tolerable side effects. Increase water.  Refill- linaclotide (LINZESS) 72 MCG capsule; 1-2 cap q am with food  Dispense: 60 capsule; Refill: 0  3. Other hyperlipidemia Cardiovascular risk and specific lipid/LDL goals reviewed.  We discussed several lifestyle modifications today and Jacqueline Collins will continue to work on diet, exercise  and weight loss efforts. Orders and follow up as documented in patient record.  Continue Crestor and prudent nutritional plan. Pt counseled on decreasing saturated and trans fats.  Counseling Intensive lifestyle modifications are the first line treatment for this issue. Dietary changes: Increase soluble fiber. Decrease simple carbohydrates. Exercise changes: Moderate to vigorous-intensity aerobic activity 150 minutes per week if tolerated. Lipid-lowering medications: see documented in medical record.  4. Vitamin D deficiency Vit D still not at goal. Low Vitamin D level contributes to fatigue and are associated with obesity, breast, and colon cancer. She agrees to increase prescription Vitamin D 50,000 IU to twice week and will  follow-up for routine testing of Vitamin D, at least 2-3 times per year to avoid over-replacement.  Increase & Refill- Vitamin D, Ergocalciferol, (DRISDOL) 1.25 MG (50000 UNIT) CAPS capsule; 1 po q sun and 1 po q wed  Dispense: 16 capsule; Refill: 0  5. Obesity with current BMI 62.0 Jacqueline Collins is currently in the action stage of change. As such, her goal is to continue with weight loss efforts. She has agreed to the Category 3 Plan with breakfast and lunch options.   Handout: Thanksgiving Strategies Exercise goals:  As is  Behavioral modification strategies: emotional eating strategies, holiday eating strategies , and planning for success. Counseling done with pt and her wife regarding how foods affect cholesterol, blood sugar, and diabetes risk. All questions answered.  Jacqueline Collins has agreed to follow-up with our clinic in 3 weeks. She was informed of the importance of frequent follow-up visits to maximize her success with intensive lifestyle modifications for her multiple health conditions.   Objective:   Blood pressure 120/88, pulse 82, temperature 97.8 F (36.6 C), height 5\' 2"  (1.575 m), weight (!) 338 lb 12.8 oz (153.7 kg), SpO2 97 %. Body mass index is 61.97 kg/m.  General: Cooperative, alert, well developed, in no acute distress. HEENT: Conjunctivae and lids unremarkable. Cardiovascular: Regular rhythm.  Lungs: Normal work of breathing. Neurologic: No focal deficits.   Lab Results  Component Value Date   CREATININE 0.86 03/16/2022   BUN 8 03/16/2022   NA 136 03/16/2022   K 4.0 03/16/2022   CL 98 03/16/2022   CO2 23 03/16/2022   Lab Results  Component Value Date   ALT 9 03/16/2022   AST 12 03/16/2022   ALKPHOS 92 03/16/2022   BILITOT 0.4 03/16/2022   Lab Results  Component Value Date   HGBA1C 5.5 03/16/2022   HGBA1C 5.6 08/15/2021   HGBA1C 6.1 (H) 02/23/2021   HGBA1C 5.9 (A) 01/06/2020   HGBA1C 5.9 01/06/2020   HGBA1C 5.9 01/06/2020   HGBA1C 5.9 01/06/2020   Lab  Results  Component Value Date   INSULIN 19.0 03/16/2022   Lab Results  Component Value Date   TSH 3.340 08/15/2021   Lab Results  Component Value Date   CHOL 137 03/16/2022   HDL 33 (L) 03/16/2022   LDLCALC 84 03/16/2022   TRIG 107 03/16/2022   CHOLHDL 4.2 03/16/2022   Lab Results  Component Value Date   VD25OH 36.0 03/16/2022   VD25OH 27.5 (L) 08/15/2021   VD25OH 15.8 (L) 02/23/2021   Lab Results  Component Value Date   WBC 8.0 08/15/2021   HGB 13.6 08/15/2021   HCT 40.5 08/15/2021   MCV 94 08/15/2021   PLT 369 08/15/2021   Lab Results  Component Value Date   IRON 84 01/13/2020   TIBC 353 01/13/2020   FERRITIN 82 01/13/2020    Attestation Statements:  Reviewed by clinician on day of visit: allergies, medications, problem list, medical history, surgical history, family history, social history, and previous encounter notes.  Time spent on visit including pre-visit chart review and post-visit care and charting was 40 minutes.   I, Kyung Rudd, BS, CMA, am acting as transcriptionist for [employer], DO.   I have reviewed the above documentation for accuracy and completeness, and I agree with the above. Carlye Grippe, D.O.  The 21st Century Cures Act was signed into law in 2016 which includes the topic of electronic health records.  This provides immediate access to information in MyChart.  This includes consultation notes, operative notes, office notes, lab results and pathology reports.  If you have any questions about what you read please let us know at your next visit so we can discuss your concerns and take corrective action if need be.  We are right here with you.

## 2022-05-22 ENCOUNTER — Other Ambulatory Visit (INDEPENDENT_AMBULATORY_CARE_PROVIDER_SITE_OTHER): Payer: Self-pay | Admitting: Family Medicine

## 2022-05-22 DIAGNOSIS — E559 Vitamin D deficiency, unspecified: Secondary | ICD-10-CM

## 2022-05-23 ENCOUNTER — Ambulatory Visit (INDEPENDENT_AMBULATORY_CARE_PROVIDER_SITE_OTHER): Payer: 59

## 2022-05-23 ENCOUNTER — Ambulatory Visit (INDEPENDENT_AMBULATORY_CARE_PROVIDER_SITE_OTHER): Payer: 59 | Admitting: Orthopedic Surgery

## 2022-05-23 ENCOUNTER — Encounter: Payer: Self-pay | Admitting: Orthopedic Surgery

## 2022-05-23 DIAGNOSIS — R202 Paresthesia of skin: Secondary | ICD-10-CM

## 2022-05-23 DIAGNOSIS — M542 Cervicalgia: Secondary | ICD-10-CM

## 2022-05-23 DIAGNOSIS — R200 Anesthesia of skin: Secondary | ICD-10-CM | POA: Diagnosis not present

## 2022-05-23 MED ORDER — DIAZEPAM 5 MG PO TABS: 5.0000 mg | ORAL_TABLET | Freq: Once | ORAL | 0 refills | Status: AC

## 2022-05-24 NOTE — Progress Notes (Signed)
Orthopaedic Clinic Return  Assessment: Jacqueline Collins is a 36 y.o. female with the following: Numbness and tingling in bilateral upper extremities, with demonstrated weakness.   Plan: Persistent numbness and tingling in both arms.  EMG was normal.  She has ongoing neck pain, with negative cervical spine x-rays.  She has completed physical therapy for her neck.  Medications are not helping.  Based on her weakness, constellation of symptoms, and attempts at nonoperative management, I am recommending an MRI of the cervical spine.  Pending the results of the MRI, we will make appropriate referral.  If the MRI happens to be negative, I would likely recommend a referral to neurology for further evaluation.  Meds ordered this encounter  Medications   diazepam (VALIUM) 5 MG tablet    Sig: Take 1 tablet (5 mg total) by mouth once for 1 dose. Take within 60 minutes of the procedure/MRI    Dispense:  1 tablet    Refill:  0    There is no height or weight on file to calculate BMI.  Follow-up: Return for After MRI.   Subjective:  Chief Complaint  Patient presents with   Hand Pain    Bil hand pain, s/p NCS.    History of Present Illness: Jacqueline Collins is a 36 y.o. female who returns to clinic for repeat evaluation of bilateral upper extremity numbness and tingling.  She continues to have numbness and tingling in all fingers to both hands.  In addition, she notes some weakness.  She has had neck pain in the past.  She has an EMG, and is here to discuss the results.  Review of Systems: +numbness and tingling  Objective: There were no vitals taken for this visit.  Physical Exam:  Alert and oriented.  No acute distress.  Bilateral upper extremities with decreased sensation throughout both hands.  4/5 grip strength.  4/5 deltoid strength.  IMAGING: I personally ordered and reviewed the following images:  X-ray of the cervical spine obtained in clinic today.  No acute injuries are  noted.  Well-maintained disc height.  Minimal degenerative changes.  No anterior listhesis.  Impression: Negative cervical spine x-ray   Oliver Barre, MD 05/24/2022 1:57 PM

## 2022-05-29 ENCOUNTER — Ambulatory Visit (INDEPENDENT_AMBULATORY_CARE_PROVIDER_SITE_OTHER): Payer: 59 | Admitting: Physician Assistant

## 2022-06-06 ENCOUNTER — Telehealth: Payer: Self-pay

## 2022-06-06 NOTE — Telephone Encounter (Signed)
Please advise. Thank you

## 2022-06-06 NOTE — Telephone Encounter (Signed)
Caller name: HISAKO BUGH  On DPR?: Yes  Call back number: [phone] (mobile)  Provider they see: Tommie Sams, DO  Reason for call:[facility] form placed in Dr Adriana Simas box to be completed regarding weight loss journey

## 2022-06-09 ENCOUNTER — Other Ambulatory Visit: Payer: Self-pay | Admitting: Family Medicine

## 2022-06-15 ENCOUNTER — Ambulatory Visit
Admission: RE | Admit: 2022-06-15 | Discharge: 2022-06-15 | Disposition: A | Discharge status: Home / Self Care | Payer: 59 | Source: Ambulatory Visit | Attending: Orthopedic Surgery | Admitting: Orthopedic Surgery

## 2022-06-15 DIAGNOSIS — R200 Anesthesia of skin: Secondary | ICD-10-CM

## 2022-06-15 DIAGNOSIS — M542 Cervicalgia: Secondary | ICD-10-CM

## 2022-06-19 ENCOUNTER — Ambulatory Visit (INDEPENDENT_AMBULATORY_CARE_PROVIDER_SITE_OTHER): Payer: 59 | Admitting: Family Medicine

## 2022-06-21 ENCOUNTER — Other Ambulatory Visit (INDEPENDENT_AMBULATORY_CARE_PROVIDER_SITE_OTHER): Payer: Self-pay | Admitting: Family Medicine

## 2022-06-21 DIAGNOSIS — E559 Vitamin D deficiency, unspecified: Secondary | ICD-10-CM

## 2022-07-06 ENCOUNTER — Ambulatory Visit (INDEPENDENT_AMBULATORY_CARE_PROVIDER_SITE_OTHER): Payer: 59 | Admitting: Family Medicine

## 2022-07-06 ENCOUNTER — Other Ambulatory Visit (INDEPENDENT_AMBULATORY_CARE_PROVIDER_SITE_OTHER): Payer: Self-pay | Admitting: Family Medicine

## 2022-07-06 DIAGNOSIS — E559 Vitamin D deficiency, unspecified: Secondary | ICD-10-CM

## 2022-07-17 ENCOUNTER — Encounter (INDEPENDENT_AMBULATORY_CARE_PROVIDER_SITE_OTHER): Payer: Self-pay | Admitting: Family Medicine

## 2022-07-17 ENCOUNTER — Ambulatory Visit (INDEPENDENT_AMBULATORY_CARE_PROVIDER_SITE_OTHER): Payer: 59 | Admitting: Family Medicine

## 2022-07-17 VITALS — BP 120/83 | HR 99 | Temp 98.1°F | Ht 62.0 in | Wt 332.0 lb

## 2022-07-17 DIAGNOSIS — E559 Vitamin D deficiency, unspecified: Secondary | ICD-10-CM | POA: Diagnosis not present

## 2022-07-17 DIAGNOSIS — Z6841 Body Mass Index (BMI) 40.0 and over, adult: Secondary | ICD-10-CM | POA: Diagnosis not present

## 2022-07-17 DIAGNOSIS — E669 Obesity, unspecified: Secondary | ICD-10-CM | POA: Diagnosis not present

## 2022-07-17 DIAGNOSIS — R7303 Prediabetes: Secondary | ICD-10-CM

## 2022-07-17 MED ORDER — TIRZEPATIDE 5 MG/0.5ML ~~LOC~~ SOAJ: 5.0000 mg | SUBCUTANEOUS | 1 refills | Status: DC

## 2022-07-17 MED ORDER — VITAMIN D (ERGOCALCIFEROL) 1.25 MG (50000 UNIT) PO CAPS: ORAL_CAPSULE | ORAL | 0 refills | Status: DC

## 2022-07-19 ENCOUNTER — Ambulatory Visit: Payer: 59 | Admitting: Family Medicine

## 2022-07-24 ENCOUNTER — Encounter: Payer: 59 | Admitting: Internal Medicine

## 2022-07-31 ENCOUNTER — Other Ambulatory Visit (HOSPITAL_COMMUNITY): Payer: Self-pay | Admitting: General Surgery

## 2022-07-31 ENCOUNTER — Encounter: Payer: Self-pay | Admitting: Family Medicine

## 2022-08-02 ENCOUNTER — Other Ambulatory Visit (INDEPENDENT_AMBULATORY_CARE_PROVIDER_SITE_OTHER): Payer: Self-pay | Admitting: Family Medicine

## 2022-08-02 DIAGNOSIS — K5904 Chronic idiopathic constipation: Secondary | ICD-10-CM

## 2022-08-03 NOTE — Progress Notes (Signed)
Chief Complaint:   OBESITY Jacqueline Collins is here to discuss her progress with her obesity treatment plan along with follow-up of her obesity related diagnoses. Jacqueline Collins is on the Category 3 Plan with breakfast and lunch options and states she is following her eating plan approximately 50% of the time. Jacqueline Collins states she is not currently exercising.  Today's visit was #: 15 Starting weight: 391 lbs Starting date: 01/31/2020 Today's weight: 332 lbs Today's date: 07/17/2022 Total lbs lost to date: 59 Total lbs lost since last in-office visit: 6  Interim History: Jacqueline Collins had a viral illness of upper respiratory.  Yesterday: breakfast- oatmeal; lunch- ceaser salad with chicken breast; smoothie for snack (4 grams of protein); dinner- did not eat. Pt drinks 1 bottle of water a day, juice, and smoothie. Pt has gastric sleeve surgery coming up. She has an appointment on January 26th and will be scheduled for surgery after that. Her mom and dad had gastric bypass.  Subjective:   1. Prediabetes Jacqueline Collins endorses she is very inconsistent with her diet. Some days she eats very little, other days she eats a lot. And frequently eats on plan like she's "supposed to.".  2. Vitamin D deficiency Jacqueline Collins is tolerating twice weekly Vitamin D from last OV's change. Her dose changed on 05/18/2022 due to level being too low and not at goal.  Assessment/Plan:  No orders of the defined types were placed in this encounter.   Medications Discontinued During This Encounter  Medication Reason   tirzepatide [facility]) 5 MG/0.5ML Pen Reorder   Vitamin D, Ergocalciferol, (DRISDOL) 1.25 MG (50000 UNIT) CAPS capsule Reorder     Meds ordered this encounter  Medications   tirzepatide (MOUNJARO) 5 MG/0.5ML Pen    Sig: Inject 5 mg into the skin once a week.    Dispense:  2 mL    Refill:  1   Vitamin D, Ergocalciferol, (DRISDOL) 1.25 MG (50000 UNIT) CAPS capsule    Sig: 1 po q sun and 1 po q wed    Dispense:  16 capsule     Refill:  0    60 d supply     1. Prediabetes No skipping meals. Increase protein intake and journal.   Refill- tirzepatide (MOUNJARO) 5 MG/0.5ML Pen; Inject 5 mg into the skin once a week.  Dispense: 2 mL; Refill: 1  2. Vitamin D deficiency Counseling done. Repeat Vitamin D at next OV.  Refill- Vitamin D, Ergocalciferol, (DRISDOL) 1.25 MG (50000 UNIT) CAPS capsule; 1 po q sun and 1 po q wed  Dispense: 16 capsule; Refill: 0  3. Obesity with current BMI 60.7 Jacqueline Collins is currently in the action stage of change. As such, her goal is to continue with weight loss efforts. She has agreed to change to keeping a food journal and adhering to recommended goals of 1600-1700 calories and 120 grams protein.   Cut out liquid calories and journal and bring log to next OV.  Exercise goals: All adults should avoid inactivity. Some physical activity is better than none, and adults who participate in any amount of physical activity gain some health benefits.  Behavioral modification strategies: increasing lean protein intake and decreasing simple carbohydrates.  Jacqueline Collins has agreed to follow-up with our clinic in 8 weeks. She was informed of the importance of frequent follow-up visits to maximize her success with intensive lifestyle modifications for her multiple health conditions.   Objective:   Blood pressure 120/83, pulse 99, temperature 98.1 F (36.7 C), height 5' 2"$  (  1.575 m), weight (!) 332 lb (150.6 kg), SpO2 100 %. Body mass index is 60.72 kg/m.  General: Cooperative, alert, well developed, in no acute distress. HEENT: Conjunctivae and lids unremarkable. Cardiovascular: Regular rhythm.  Lungs: Normal work of breathing. Neurologic: No focal deficits.   Lab Results  Component Value Date   CREATININE 0.86 03/16/2022   BUN 8 03/16/2022   NA 136 03/16/2022   K 4.0 03/16/2022   CL 98 03/16/2022   CO2 23 03/16/2022   Lab Results  Component Value Date   ALT 9 03/16/2022   AST 12  03/16/2022   ALKPHOS 92 03/16/2022   BILITOT 0.4 03/16/2022   Lab Results  Component Value Date   HGBA1C 5.5 03/16/2022   HGBA1C 5.6 08/15/2021   HGBA1C 6.1 (H) 02/23/2021   HGBA1C 5.9 (A) 01/06/2020   HGBA1C 5.9 01/06/2020   HGBA1C 5.9 01/06/2020   HGBA1C 5.9 01/06/2020   Lab Results  Component Value Date   INSULIN 19.0 03/16/2022   Lab Results  Component Value Date   TSH 3.340 08/15/2021   Lab Results  Component Value Date   CHOL 137 03/16/2022   HDL 33 (L) 03/16/2022   LDLCALC 84 03/16/2022   TRIG 107 03/16/2022   CHOLHDL 4.2 03/16/2022   Lab Results  Component Value Date   VD25OH 36.0 03/16/2022   VD25OH 27.5 (L) 08/15/2021   VD25OH 15.8 (L) 02/23/2021   Lab Results  Component Value Date   WBC 8.0 08/15/2021   HGB 13.6 08/15/2021   HCT 40.5 08/15/2021   MCV 94 08/15/2021   PLT 369 08/15/2021   Lab Results  Component Value Date   IRON 84 01/13/2020   TIBC 353 01/13/2020   FERRITIN 82 01/13/2020   Attestation Statements:   Reviewed by clinician on day of visit: allergies, medications, problem list, medical history, surgical history, family history, social history, and previous encounter notes.  I, Kathlene November, BS, CMA, am acting as transcriptionist for [employer], DO.  I have reviewed the above documentation for accuracy and completeness, and I agree with the above. Marjory Sneddon, D.O.  The [place] was signed into law in 2016 which includes the topic of electronic health records.  This provides immediate access to information in MyChart.  This includes consultation notes, operative notes, office notes, lab results and pathology reports.  If you have any questions about what you read please let us know at your next visit so we can discuss your concerns and take corrective action if need be.  We are right here with you.

## 2022-08-07 ENCOUNTER — Other Ambulatory Visit: Payer: Self-pay | Admitting: *Deleted

## 2022-08-11 ENCOUNTER — Ambulatory Visit (HOSPITAL_COMMUNITY)
Admission: RE | Admit: 2022-08-11 | Discharge: 2022-08-11 | Disposition: A | Discharge status: Home / Self Care | Payer: 59 | Source: Ambulatory Visit | Attending: General Surgery | Admitting: General Surgery

## 2022-08-11 ENCOUNTER — Other Ambulatory Visit (INDEPENDENT_AMBULATORY_CARE_PROVIDER_SITE_OTHER): Payer: Self-pay | Admitting: Family Medicine

## 2022-08-11 ENCOUNTER — Other Ambulatory Visit: Payer: Self-pay

## 2022-08-11 DIAGNOSIS — K5904 Chronic idiopathic constipation: Secondary | ICD-10-CM

## 2022-08-14 ENCOUNTER — Telehealth: Payer: Self-pay | Admitting: Family Medicine

## 2022-08-14 DIAGNOSIS — G43709 Chronic migraine without aura, not intractable, without status migrainosus: Secondary | ICD-10-CM

## 2022-08-14 NOTE — Telephone Encounter (Signed)
Patient is requesting a letter stating why she can't work right now as soon as possible to carry out to social services so she can apply for food stamps.

## 2022-08-16 ENCOUNTER — Telehealth: Payer: Self-pay

## 2022-08-16 ENCOUNTER — Other Ambulatory Visit: Payer: Self-pay | Admitting: Family Medicine

## 2022-08-16 MED ORDER — EMGALITY 120 MG/ML ~~LOC~~ SOAJ: 120.0000 mg | SUBCUTANEOUS | 0 refills | Status: DC

## 2022-08-16 NOTE — Telephone Encounter (Signed)
Patient is requesting a letter stating why she can't work right now as soon as possible to carry out to social services so she can apply for food stamps please advise

## 2022-08-18 NOTE — Telephone Encounter (Signed)
Patient has picked up letter

## 2022-08-24 ENCOUNTER — Encounter: Payer: 59 | Attending: General Surgery | Admitting: Dietician

## 2022-08-24 ENCOUNTER — Encounter: Payer: Self-pay | Admitting: Dietician

## 2022-08-24 VITALS — Ht 62.0 in | Wt 337.9 lb

## 2022-08-24 DIAGNOSIS — E669 Obesity, unspecified: Secondary | ICD-10-CM | POA: Diagnosis present

## 2022-08-24 NOTE — Progress Notes (Signed)
Nutrition Assessment for Bariatric Surgery: Pre-Surgery Behavioral and Nutrition Intervention Program   Medical Nutrition Therapy  Appt Start Time: 1:50    End Time: 3:10  Patient was seen on 08/24/2022 for Pre-Operative Nutrition Assessment. Purpose of todays visit  enhance perioperative outcomes along with a healthy weight maintenance   Referral stated Supervised Weight Loss (SWL) visits needed: 0  Pt completed visits.   Pt has cleared nutrition requirements.   Planned surgery: Sleeve Gastrectomy Pt expectation of surgery: live a healthier life Pt expectation of dietitian: states [facility]   NUTRITION ASSESSMENT   Anthropometrics  Start weight at [facility]: 337.9 lbs (date: 08/24/2022) Height: 62 in BMI: 61.80 kg/m2     Clinical   Pharmacotherapy: History of weight loss medication used: Mounjaro   Medical hx: asthma, sleep apnea, obesity Medications: emgaity, vit D montelukast, albu  Labs: HDL 33;  Notable signs/symptoms: none noted Any previous deficiencies? No  Evaluation of Nutritional Deficiencies: Micronutrient Nutrition Focused Physical Exam: Hair: No issues observed Eyes: No issues observed Mouth: No issues observed Neck: No issues observed Nails: No issues observed Skin: No issues observed  Lifestyle & Dietary Hx  Pt states her mother had bariatric surgery several years ago, stating she went to all her nutrition visits. Pt states she lost a fair amount of weight on medication.  Pt states she is seeing a dietitian at [facility]. Pt states she has scale at home. Pt states she drinks protein shakes now at home. Pt states she tries to get 4 bottles of water a day. Pt states she uses a CPAP machine twice a week, stating her machine needs to be adjusted. Pt states she has been out of work for two years, stating she is trying to get disability, due to her health. Pt states she gets food stamps, stating  that doesn't go very far when you are trying to eat healthy.  Current Physical Activity Recommendations state 150 minutes per week of moderate to vigorous movement including Cardio and 1-2 days of resistance activities as well as flexibility/balance activities:  Pts current physical activity: go to the gym daily for an hour of cardio, with 100% recommendation reached   Sleep Hygiene: duration and quality: 8, good quality; sometimes with CPAP  Current Patient Perceived Stress Level as stated by pt on a scale of 1-10: 5-7       Stress Management Techniques: gym, meditation and stretches on app  According to the Dietary Guidelines for Americans Recommendation: equivalent 1.5-2 cups fruits per day, equivalent 2-3 cups vegetables per day and at least half all grains whole  Fruit servings per day (on average): 1, meeting 50-66% recommendation  Non-starchy vegetable servings per day (on average): 2, meeting 66-100% recommendation  Whole Grains per day (on average): 0-1  Number of meals missed/skipped per week out of 21: 0  24-Hr Dietary Recall First Meal: oatmeal or steak and fruit, water Snack: apple or mango or peach Second Meal: baked chicken, broccoli Snack:  Third Meal: smoothie (frozen fruit, greek yogurt, almond milk, protein powder), chicken, water Snack: fruit or smoothie Beverages: water, vitamin water (with sugar)  Alcoholic beverages per week: special occasions, 1 drink twice a year   Estimated Energy Needs Calories: 1500  NUTRITION DIAGNOSIS  Overweight/obesity ([place]-3.3) related to past poor dietary habits and physical inactivity as evidenced by patient w/ planned sleeve surgery following dietary guidelines for continued weight loss.   NUTRITION INTERVENTION  Nutrition counseling (  C-1) and education (E-2) to facilitate bariatric surgery goals.  Educated pt on micronutrient deficiencies post-surgery and behavioral/dietary strategies to start in order to mitigate that risk    Behavioral and Dietary Interventions Pre-Op Goals Reviewed with the Patient Nutrition: Healthy Eating Behaviors Switch to non-caloric, non-carbonated and non-caffeinated beverages such as  water, unsweetened tea, Crystal Light and zero calorie beverages (aim for 64 oz. per day) Cut out grazing between meals or at night  Find a protein shake you like Eat every 3-5 hours        Eliminate distractions while eating (TV, computer, reading, driving, texting) Take 20-30 minutes to eat a meal  Decrease high sugar foods/decrease high fat/fried foods Eliminate alcoholic beverages Increase protein intake (eggs, fish, chicken, yogurt) before surgery Eat non starchy vegetables 2 times a day 7 days a week Eat complex carbohydrates such as whole grains and fruits   Behavioral Modification: Physical Activity Increase my usual daily activity (use stairs, park farther, etc.) Engage in _______________________  activity  _______ minutes ______ times per week  Other:    _________________________________________________________________     Problem Solving I will think about my usual eating patterns and how to tweak them How can my friends and family support me Barriers to starting my changes Learn and understand appetite verses hunger   Healthy Coping Allow for ___________ activities per week to help me manage stress Reframe negative thoughts I will keep a picture of someone or something that is my inspiration & look at it daily   Monitoring  Weigh myself once a week  Measure my progress by monitoring how my clothes fit Keep a food record of what I eat and drink for the next ________ (time period) Take pictures of what I eat and drink for the next ________ (time period) Use an app to count steps/day for the next_______ (time period) Measure my progress such as increased energy and more restful sleep Monitor your acid reflux and bowel habits, are they getting better?    Handouts Provided Include   Bariatric Surgery handouts (Nutrition Visits, Pre Surgery Behavioral Change Goals, Protein Shakes Brands to Choose From, Vitamins & Mineral Supplementation)  Learning Style & Readiness for Change Teaching method utilized: Visual, Auditory, and hands on  Demonstrated degree of understanding via: Teach Back  Readiness Level: preparation Barriers to learning/adherence to lifestyle change: nothing identified   RD's Notes for Next Visit   MONITORING & EVALUATION Dietary intake, weekly physical activity, body weight, and preoperative behavioral change goals   Next Steps  Pt has completed visits. No further supervised visits required/recomended  Patient is to follow up at [place] for pre-op class >2 week prior to scheduled surgery.
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
## 2022-08-29 ENCOUNTER — Ambulatory Visit: Payer: 59 | Admitting: Gastroenterology

## 2022-08-29 ENCOUNTER — Encounter: Payer: Self-pay | Admitting: Gastroenterology

## 2022-08-29 VITALS — BP 109/82 | HR 94 | Temp 97.8°F | Ht 62.0 in | Wt 338.8 lb

## 2022-08-29 DIAGNOSIS — R198 Other specified symptoms and signs involving the digestive system and abdomen: Secondary | ICD-10-CM | POA: Diagnosis not present

## 2022-08-29 DIAGNOSIS — R1319 Other dysphagia: Secondary | ICD-10-CM

## 2022-08-29 DIAGNOSIS — R1310 Dysphagia, unspecified: Secondary | ICD-10-CM | POA: Insufficient documentation

## 2022-08-29 DIAGNOSIS — K222 Esophageal obstruction: Secondary | ICD-10-CM | POA: Insufficient documentation

## 2022-08-29 DIAGNOSIS — K219 Gastro-esophageal reflux disease without esophagitis: Secondary | ICD-10-CM | POA: Diagnosis not present

## 2022-08-29 MED ORDER — LINACLOTIDE 145 MCG PO CAPS: 145.0000 ug | ORAL_CAPSULE | Freq: Every day | ORAL | 11 refills | Status: DC

## 2022-08-29 NOTE — Patient Instructions (Signed)
Colonoscopy and upper endoscopy to be scheduled.  Continue Linzess, new dose of 111mg, take once daily. Start good probiotic such as [employer]ron[employer] engineeror Restora once daily for 1-2 months. Samples of Restora provided.  Limit foods listed below that can increase gas/bloating.  Foods to avoid Fruits Fresh, dried, and juiced forms of apple, pear, watermelon, peach, plum, cherries, apricots, blackberries, boysenberries, figs, nectarines, and mango. Avocado. Vegetables Chicory root, artichoke, asparagus, cabbage, snow peas, Brussels sprouts, broccoli, sugar snap peas, mushrooms, celery, and cauliflower. Onions, garlic, leeks, and the white part of scallions. Grains Wheat, including kamut, durum, and semolina. Barley and bulgur. Couscous. Wheat-based cereals. Wheat noodles, bread, crackers, and pastries. Meats and other proteins Fried or fatty meat. Sausage. Cashews and pistachios. Soybeans, baked beans, black beans, chickpeas, kidney beans, fava beans, navy beans, lentils, black-eyed peas, and split peas. Dairy Milk, yogurt, ice cream, and soft cheese. Cream and sour cream. Milk-based sauces. Custard. Buttermilk. Soy milk. Seasoning and other foods Any sugar-free gum or candy. Foods that contain artificial sweeteners such as sorbitol, mannitol, isomalt, or xylitol. Foods that contain honey, high-fructose corn syrup, or agave. Bouillon, vegetable stock, beef stock, and chicken stock. Garlic and onion powder. Condiments made with onion, such as hummus, chutney, pickles, relish, salad dressing, and salsa. Tomato paste. Beverages Chicory-based drinks. Coffee substitutes. Chamomile tea. Fennel tea. Sweet or fortified wines such as port or sherry. Diet soft drinks made with isomalt, mannitol, maltitol, sorbitol, or xylitol. Apple, pear, and mango juice. Juices with high-fructose corn syrup. The items listed above may not be a complete list of foods and beverages you should avoid. Contact a dietitian for more  information.

## 2022-08-30 ENCOUNTER — Telehealth: Payer: Self-pay | Admitting: *Deleted

## 2022-08-30 NOTE — Telephone Encounter (Signed)
[facility]   TCS/EGD/ED w/Dr.Carver. ASA 3 Hold mounjaro x 7 days

## 2022-08-31 ENCOUNTER — Other Ambulatory Visit: Payer: Self-pay | Admitting: *Deleted

## 2022-08-31 ENCOUNTER — Encounter: Payer: Self-pay | Admitting: Radiology

## 2022-08-31 ENCOUNTER — Encounter: Payer: Self-pay | Admitting: *Deleted

## 2022-08-31 MED ORDER — PEG 3350-KCL-NA BICARB-NACL 420 G PO SOLR: 4000.0000 mL | Freq: Once | ORAL | 0 refills | Status: AC

## 2022-08-31 NOTE — Telephone Encounter (Signed)
Pt has been scheduled for 09/21/22. Instructions sent to pt via MyChart and prep sent to the pharmacy.

## 2022-09-01 ENCOUNTER — Encounter: Payer: Self-pay | Admitting: *Deleted

## 2022-09-10 ENCOUNTER — Encounter: Payer: Self-pay | Admitting: Family Medicine

## 2022-09-11 ENCOUNTER — Encounter (INDEPENDENT_AMBULATORY_CARE_PROVIDER_SITE_OTHER): Payer: Self-pay | Admitting: Family Medicine

## 2022-09-11 ENCOUNTER — Telehealth (INDEPENDENT_AMBULATORY_CARE_PROVIDER_SITE_OTHER): Payer: 59 | Admitting: Family Medicine

## 2022-09-11 DIAGNOSIS — Z6841 Body Mass Index (BMI) 40.0 and over, adult: Secondary | ICD-10-CM | POA: Diagnosis not present

## 2022-09-11 DIAGNOSIS — R7303 Prediabetes: Secondary | ICD-10-CM

## 2022-09-11 DIAGNOSIS — E559 Vitamin D deficiency, unspecified: Secondary | ICD-10-CM | POA: Diagnosis not present

## 2022-09-11 MED ORDER — TIRZEPATIDE 5 MG/0.5ML ~~LOC~~ SOAJ: 5.0000 mg | SUBCUTANEOUS | 1 refills | Status: DC

## 2022-09-11 MED ORDER — VITAMIN D (ERGOCALCIFEROL) 1.25 MG (50000 UNIT) PO CAPS: ORAL_CAPSULE | ORAL | 0 refills | Status: DC

## 2022-09-11 NOTE — Progress Notes (Signed)
TeleHealth Visit:  This visit was completed with telemedicine (audio/video) technology. Jacqueline Collins has verbally consented to this TeleHealth visit. The patient is located at home, the provider is located at home. The participants in this visit include the listed provider and patient. The visit was conducted today via MyChart video.  OBESITY Jacqueline Collins is here to discuss her progress with her obesity treatment plan along with follow-up of her obesity related diagnoses.   Today's visit was # 16 Starting weight: 391 lbs Starting date: 01/31/2020 Weight at last in office visit: 332 lbs on 07/17/22 Total weight loss: 59 lbs at last in office visit on 07/17/22. Today's reported weight:  No weight reported.  Nutrition Plan: keeping a food journal with goal of 1600-1700 calories and 120 grams of protein daily   Current exercise: Goes to [employer]-cardio, bike or treadmill - 80 minutes 4 days per week. Does weights some on the weekend.   Interim History:  She is preparing for a sleeve gastrectomy within the next month or so with Dr. Greer Pickerel.  She has a colonoscopy/EGD scheduled for 09/21/2022 which is part of the process.   Her partner is very supportive of her weight loss journey and does all of the cooking.  She has 2 protein shakes per day and says she exceeds her protein goal. She says that her partner is keeping track of her calories and keeping them less than 1500-1700/day.  She sometimes has trouble getting in all of the calories.    She is drinking 3-4 bottles per day.  She is working on consistently getting 4 bottles per day.   Assessment/Plan:  1. Prediabetes Last A1c was 5.5  Medication(s): Mounjaro 5 mg weekly. Appetite well-controlled.  Lab Results  Component Value Date   HGBA1C 5.5 03/16/2022   HGBA1C 5.6 08/15/2021   HGBA1C 6.1 (H) 02/23/2021   HGBA1C 5.9 (A) 01/06/2020   HGBA1C 5.9 01/06/2020   HGBA1C 5.9 01/06/2020   HGBA1C 5.9 01/06/2020   Lab Results   Component Value Date   INSULIN 19.0 03/16/2022    Plan: Will stop Mounjaro 2 weeks prior to surgery.  Refill Mounjaro 5 mg weekly.  2. Vitamin D Deficiency Vitamin D is not at goal of 50.  Most recent vitamin D level was 36 on 03/16/2022. She is on prescription vitamin D 50,000 IU every Wednesday and Sunday. Lab Results  Component Value Date   VD25OH 36.0 03/16/2022   VD25OH 27.5 (L) 08/15/2021   VD25OH 15.8 (L) 02/23/2021    Plan: Refill prescription vitamin D 50,000 IU every Wednesday and Sunday.   3. Morbid Obesity: Current BMI 61.9 Jacqueline Collins is currently in the action stage of change. As such, her goal is to continue with weight loss efforts.  She has agreed to keeping a food journal with goal of 1600-1700 calories and 120 grams of protein daily.  Exercise goals:  as is  Behavioral modification strategies: increasing lean protein intake, decreasing simple carbohydrates , meal planning , increase water intake, and increase frequency of journaling.  Jacqueline Collins has agreed to follow-up with our clinic in 4 weeks.   No orders of the defined types were placed in this encounter.   Medications Discontinued During This Encounter  Medication Reason   tirzepatide [facility]) 5 MG/0.5ML Pen Reorder   Vitamin D, Ergocalciferol, (DRISDOL) 1.25 MG (50000 UNIT) CAPS capsule Reorder     Meds ordered this encounter  Medications   Vitamin D, Ergocalciferol, (DRISDOL) 1.25 MG (50000 UNIT) CAPS capsule    Sig:  1 po q sun and 1 po q wed    Dispense:  16 capsule    Refill:  0    60 d supply    Order Specific Question:   Supervising Provider    Answer:   Dell Ponto [[id number]]   tirzepatide (MOUNJARO) 5 MG/0.5ML Pen    Sig: Inject 5 mg into the skin once a week.    Dispense:  2 mL    Refill:  1    Order Specific Question:   Supervising Provider    Answer:   Dell Ponto [[id number]]      Objective:   VITALS: Per patient if applicable, see vitals. GENERAL: Alert and in no acute  distress. CARDIOPULMONARY: No increased WOB. Speaking in clear sentences.  PSYCH: Pleasant and cooperative. Speech normal rate and rhythm. Affect is appropriate. Insight and judgement are appropriate. Attention is focused, linear, and appropriate.  NEURO: Oriented as arrived to appointment on time with no prompting.   Attestation Statements:   Reviewed by clinician on day of visit: allergies, medications, problem list, medical history, surgical history, family history, social history, and previous encounter notes.  This was prepared with the assistance of Presenter, broadcasting.  Occasional wrong-word or sound-a-like substitutions may have occurred due to the inherent limitations of voice recognition software.

## 2022-09-14 NOTE — Patient Instructions (Signed)
Jacqueline Collins  09/14/2022     '@[email or web address]'$ @   Your procedure is scheduled on  09/21/2022.   Report to [facility] at  1015  A.M.   Call this number if you have problems the morning of surgery:  [phone]  If you experience any cold or flu symptoms such as cough, fever, chills, shortness of breath, etc. between now and your scheduled surgery, please notify us at the above number.   Remember:  Follow the diet and prep instructions given to you by the office.       Your last dose of mounjaro should have been on 09/13/2022.     Take these medicines the morning of surgery with A SIP OF WATER      wellbutrin, flexeril, prozac, mobic, omeprazole, ubrelvy.     Do not wear jewelry, make-up or nail polish.  Do not wear lotions, powders, or perfumes, or deodorant.  Do not shave 48 hours prior to surgery.  Men may shave face and neck.  Do not bring valuables to the hospital.  [facility] is not responsible for any belongings or valuables.  Contacts, dentures or bridgework may not be worn into surgery.  Leave your suitcase in the car.  After surgery it may be brought to your room.  For patients admitted to the hospital, discharge time will be determined by your treatment team.  Patients discharged the day of surgery will not be allowed to drive home and must have someone with them for 24 hours.    Special instructions:   DO NOT smoke tobacco or vape for 24 hors before your procedure.  Please read over the following fact sheets that you were given. Anesthesia Post-op Instructions and Care and Recovery After Surgery      Upper Endoscopy, Adult, Care After After the procedure, it is common to have a sore throat. It is also common to have: Mild stomach pain or discomfort. Bloating. Nausea. Follow these instructions at home: The instructions below may help you care for yourself at home. Your health care provider may give you more instructions. If you have  questions, ask your health care provider. If you were given a sedative during the procedure, it can affect you for several hours. Do not drive or operate machinery until your health care provider says that it is safe. If you will be going home right after the procedure, plan to have a responsible adult: Take you home from the hospital or clinic. You will not be allowed to drive. Care for you for the time you are told. Follow instructions from your health care provider about what you may eat and drink. Return to your normal activities as told by your health care provider. Ask your health care provider what activities are safe for you. Take over-the-counter and prescription medicines only as told by your health care provider. Contact a health care provider if you: Have a sore throat that lasts longer than one day. Have trouble swallowing. Have a fever. Get help right away if you: Vomit blood or your vomit looks like coffee grounds. Have bloody, black, or tarry stools. Have a very bad sore throat or you cannot swallow. Have difficulty breathing or very bad pain in your chest or abdomen. These symptoms may be an emergency. Get help right away. Call 911. Do not wait to see if the symptoms will go away. Do not drive yourself to the hospital. Summary After the procedure, it  is common to have a sore throat, mild stomach discomfort, bloating, and nausea. If you were given a sedative during the procedure, it can affect you for several hours. Do not drive until your health care provider says that it is safe. Follow instructions from your health care provider about what you may eat and drink. Return to your normal activities as told by your health care provider. This information is not intended to replace advice given to you by your health care provider. Make sure you discuss any questions you have with your health care provider. Document Revised: 09/28/2021 Document Reviewed: 09/28/2021 Elsevier  Patient Education  [place]. Esophageal Dilatation Esophageal dilatation, also called esophageal dilation, is a procedure to widen or open a blocked or narrowed part of the esophagus. The esophagus is the part of the body that moves food and liquid from the mouth to the stomach. You may need this procedure if: You have a buildup of scar tissue in your esophagus that makes it difficult, painful, or impossible to swallow. This can be caused by gastroesophageal reflux disease (GERD). You have cancer of the esophagus. There is a problem with how food moves through your esophagus. In some cases, you may need this procedure repeated at a later time to dilate the esophagus gradually. Tell a health care provider about: Any allergies you have. All medicines you are taking, including vitamins, herbs, eye drops, creams, and over-the-counter medicines. Any problems you or family members have had with anesthetic medicines. Any blood disorders you have. Any surgeries you have had. Any medical conditions you have. Any antibiotic medicines you are required to take before dental procedures. Whether you are pregnant or may be pregnant. What are the risks? Generally, this is a safe procedure. However, problems may occur, including: Bleeding due to a tear in the lining of the esophagus. A hole, or perforation, in the esophagus. What happens before the procedure? Ask your health care provider about: Changing or stopping your regular medicines. This is especially important if you are taking diabetes medicines or blood thinners. Taking medicines such as aspirin and ibuprofen. These medicines can thin your blood. Do not take these medicines unless your health care provider tells you to take them. Taking over-the-counter medicines, vitamins, herbs, and supplements. Follow instructions from your health care provider about eating or drinking restrictions. Plan to have a responsible adult take you home from  the hospital or clinic. Plan to have a responsible adult care for you for the time you are told after you leave the hospital or clinic. This is important. What happens during the procedure? You may be given a medicine to help you relax (sedative). A numbing medicine may be sprayed into the back of your throat, or you may gargle the medicine. Your health care provider may perform the dilatation using various surgical instruments, such as: Simple dilators. This instrument is carefully placed in the esophagus to stretch it. Guided wire bougies. This involves using an endoscope to insert a wire into the esophagus. A dilator is passed over this wire to enlarge the esophagus. Then the wire is removed. Balloon dilators. An endoscope with a small balloon is inserted into the esophagus. The balloon is inflated to stretch the esophagus and open it up. The procedure may vary among health care providers and hospitals. What can I expect after the procedure? Your blood pressure, heart rate, breathing rate, and blood oxygen level will be monitored until you leave the hospital or clinic. Your throat may feel  slightly sore and numb. This will get better over time. You will not be allowed to eat or drink until your throat is no longer numb. When you are able to drink, urinate, and sit on the edge of the bed without nausea or dizziness, you may be able to return home. Follow these instructions at home: Take over-the-counter and prescription medicines only as told by your health care provider. If you were given a sedative during the procedure, it can affect you for several hours. Do not drive or operate machinery until your health care provider says that it is safe. Plan to have a responsible adult care for you for the time you are told. This is important. Follow instructions from your health care provider about any eating or drinking restrictions. Do not use any products that contain nicotine or tobacco, such as  cigarettes, e-cigarettes, and chewing tobacco. If you need help quitting, ask your health care provider. Keep all follow-up visits. This is important. Contact a health care provider if: You have a fever. You have pain that is not relieved by medicine. Get help right away if: You have chest pain. You have trouble breathing. You have trouble swallowing. You vomit blood. You have black, tarry, or bloody stools. These symptoms may represent a serious problem that is an emergency. Do not wait to see if the symptoms will go away. Get medical help right away. Call your local emergency services (911 in the [place] Do not drive yourself to the hospital. Summary Esophageal dilatation, also called esophageal dilation, is a procedure to widen or open a blocked or narrowed part of the esophagus. Plan to have a responsible adult take you home from the hospital or clinic. For this procedure, a numbing medicine may be sprayed into the back of your throat, or you may gargle the medicine. Do not drive or operate machinery until your health care provider says that it is safe. This information is not intended to replace advice given to you by your health care provider. Make sure you discuss any questions you have with your health care provider. Document Revised: 11/05/2019 Document Reviewed: 11/05/2019 Elsevier Patient Education  [place]. Colonoscopy, Adult, Care After The following information offers guidance on how to care for yourself after your procedure. Your health care provider may also give you more specific instructions. If you have problems or questions, contact your health care provider. What can I expect after the procedure? After the procedure, it is common to have: A small amount of blood in your stool for 24 hours after the procedure. Some gas. Mild cramping or bloating of your abdomen. Follow these instructions at home: Eating and drinking  Drink enough fluid to keep your urine  pale yellow. Follow instructions from your health care provider about eating or drinking restrictions. Resume your normal diet as told by your health care provider. Avoid heavy or fried foods that are hard to digest. Activity Rest as told by your health care provider. Avoid sitting for a long time without moving. Get up to take short walks every 1-2 hours. This is important to improve blood flow and breathing. Ask for help if you feel weak or unsteady. Return to your normal activities as told by your health care provider. Ask your health care provider what activities are safe for you. Managing cramping and bloating  Try walking around when you have cramps or feel bloated. If directed, apply heat to your abdomen as told by your health care provider. Use the  heat source that your health care provider recommends, such as a moist heat pack or a heating pad. Place a towel between your skin and the heat source. Leave the heat on for 20-30 minutes. Remove the heat if your skin turns bright red. This is especially important if you are unable to feel pain, heat, or cold. You have a greater risk of getting burned. General instructions If you were given a sedative during the procedure, it can affect you for several hours. Do not drive or operate machinery until your health care provider says that it is safe. For the first 24 hours after the procedure: Do not sign important documents. Do not drink alcohol. Do your regular daily activities at a slower pace than normal. Eat soft foods that are easy to digest. Take over-the-counter and prescription medicines only as told by your health care provider. Keep all follow-up visits. This is important. Contact a health care provider if: You have blood in your stool 2-3 days after the procedure. Get help right away if: You have more than a small spotting of blood in your stool. You have large blood clots in your stool. You have swelling of your abdomen. You  have nausea or vomiting. You have a fever. You have increasing pain in your abdomen that is not relieved with medicine. These symptoms may be an emergency. Get help right away. Call 911. Do not wait to see if the symptoms will go away. Do not drive yourself to the hospital. Summary After the procedure, it is common to have a small amount of blood in your stool. You may also have mild cramping and bloating of your abdomen. If you were given a sedative during the procedure, it can affect you for several hours. Do not drive or operate machinery until your health care provider says that it is safe. Get help right away if you have a lot of blood in your stool, nausea or vomiting, a fever, or increased pain in your abdomen. This information is not intended to replace advice given to you by your health care provider. Make sure you discuss any questions you have with your health care provider. Document Revised: 02/09/2021 Document Reviewed: 02/09/2021 Elsevier Patient Education  [place] After The following information offers guidance on how to care for yourself after your procedure. Your health care provider may also give you more specific instructions. If you have problems or questions, contact your health care provider. What can I expect after the procedure? After the procedure, it is common to have: Tiredness. Little or no memory about what happened during or after the procedure. Impaired judgment when it comes to making decisions. Nausea or vomiting. Some trouble with balance. Follow these instructions at home: For the time period you were told by your health care provider:  Rest. Do not participate in activities where you could fall or become injured. Do not drive or use machinery. Do not drink alcohol. Do not take sleeping pills or medicines that cause drowsiness. Do not make important decisions or sign legal documents. Do not take care of  children on your own. Medicines Take over-the-counter and prescription medicines only as told by your health care provider. If you were prescribed antibiotics, take them as told by your health care provider. Do not stop using the antibiotic even if you start to feel better. Eating and drinking Follow instructions from your health care provider about what you may eat and drink. Drink enough fluid to  keep your urine pale yellow. If you vomit: Drink clear fluids slowly and in small amounts as you are able. Clear fluids include water, ice chips, low-calorie sports drinks, and fruit juice that has water added to it (diluted fruit juice). Eat light and bland foods in small amounts as you are able. These foods include bananas, applesauce, rice, lean meats, toast, and crackers. General instructions  Have a responsible adult stay with you for the time you are told. It is important to have someone help care for you until you are awake and alert. If you have sleep apnea, surgery and some medicines can increase your risk for breathing problems. Follow instructions from your health care provider about wearing your sleep device: When you are sleeping. This includes during daytime naps. While taking prescription pain medicines, sleeping medicines, or medicines that make you drowsy. Do not use any products that contain nicotine or tobacco. These products include cigarettes, chewing tobacco, and vaping devices, such as e-cigarettes. If you need help quitting, ask your health care provider. Contact a health care provider if: You feel nauseous or vomit every time you eat or drink. You feel light-headed. You are still sleepy or having trouble with balance after 24 hours. You get a rash. You have a fever. You have redness or swelling around the IV site. Get help right away if: You have trouble breathing. You have new confusion after you get home. These symptoms may be an emergency. Get help right away. Call  911. Do not wait to see if the symptoms will go away. Do not drive yourself to the hospital. This information is not intended to replace advice given to you by your health care provider. Make sure you discuss any questions you have with your health care provider. Document Revised: 11/14/2021 Document Reviewed: 11/14/2021 Elsevier Patient Education  [place].

## 2022-09-15 ENCOUNTER — Ambulatory Visit (INDEPENDENT_AMBULATORY_CARE_PROVIDER_SITE_OTHER): Payer: 59 | Admitting: Licensed Clinical Social Worker

## 2022-09-15 DIAGNOSIS — F4323 Adjustment disorder with mixed anxiety and depressed mood: Secondary | ICD-10-CM
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
## 2022-09-18 ENCOUNTER — Encounter (HOSPITAL_COMMUNITY)
Admission: RE | Admit: 2022-09-18 | Discharge: 2022-09-18 | Disposition: A | Discharge status: Home / Self Care | Payer: 59 | Source: Ambulatory Visit | Attending: Internal Medicine | Admitting: Internal Medicine

## 2022-09-18 ENCOUNTER — Encounter (HOSPITAL_COMMUNITY): Payer: Self-pay | Admitting: Anesthesiology

## 2022-09-18 ENCOUNTER — Telehealth (INDEPENDENT_AMBULATORY_CARE_PROVIDER_SITE_OTHER): Payer: Self-pay | Admitting: *Deleted

## 2022-09-18 DIAGNOSIS — Z01818 Encounter for other preprocedural examination: Secondary | ICD-10-CM

## 2022-09-18 DIAGNOSIS — R7303 Prediabetes: Secondary | ICD-10-CM

## 2022-09-18 NOTE — Telephone Encounter (Signed)
-----   Message from Jacqulynn Cadet, RN sent at 09/18/2022  2:13 PM EDT ----- Regarding: Cancellation Patient scheduled for EGD/ED/TCS on 09/21/2022. presents to PAT c/o sinus infection with thick green drainage.   States she feels "so bad " and needs to see her Primary doctor for antibiotics. We will cancel her procedure for now and reschedule once she is treated for this infection.

## 2022-09-18 NOTE — Progress Notes (Signed)
  Cancellation Received: Today Jacqueline Cadet, RN  [name], CMA; [name], RN Patient scheduled for EGD/ED/TCS on 09/21/2022. presents to PAT c/o sinus infection with thick green drainage.   States she feels "so bad " and needs to see her Primary doctor for antibiotics. We will cancel her procedure for now and reschedule once she is treated for this infection.

## 2022-09-21 ENCOUNTER — Encounter (HOSPITAL_COMMUNITY): Admission: RE | Payer: Self-pay | Source: Home / Self Care

## 2022-09-21 ENCOUNTER — Ambulatory Visit (HOSPITAL_COMMUNITY): Admission: RE | Admit: 2022-09-21 | Payer: 59 | Source: Home / Self Care

## 2022-09-21 SURGERY — COLONOSCOPY WITH PROPOFOL
Anesthesia: Monitor Anesthesia Care

## 2022-09-25 ENCOUNTER — Ambulatory Visit: Payer: 59 | Admitting: Family Medicine

## 2022-09-25 VITALS — BP 120/80 | HR 84 | Temp 98.1°F | Ht 62.0 in | Wt 342.0 lb

## 2022-09-25 DIAGNOSIS — J329 Chronic sinusitis, unspecified: Secondary | ICD-10-CM | POA: Insufficient documentation

## 2022-09-25 MED ORDER — FLUCONAZOLE 150 MG PO TABS: 150.0000 mg | ORAL_TABLET | Freq: Once | ORAL | 1 refills | Status: AC

## 2022-09-25 MED ORDER — AMOXICILLIN-POT CLAVULANATE 875-125 MG PO TABS: 1.0000 | ORAL_TABLET | Freq: Two times a day (BID) | ORAL | 0 refills | Status: DC

## 2022-09-25 NOTE — Assessment & Plan Note (Signed)
Treating with Augmentin.  Diflucan to be taken if she develops yeast vaginitis.

## 2022-09-25 NOTE — Patient Instructions (Signed)
Antibiotic as prescribed.  Diflucan as needed.  Take care  Dr. Lacinda Axon

## 2022-09-25 NOTE — Progress Notes (Signed)
Subjective:  Patient ID: Jacqueline Collins, female    DOB: 1985-09-20  Age: 37 y.o. MRN: [id number]  CC: Chief Complaint  Patient presents with   Sinus Problem    2 weeks, headache, nasal  pressure, coughing up yell/ green mucus     HPI:  37 year old female presents for evaluation of the above.  Patient reports that she has been sick for the past 2 weeks.  She has had sinus pressure and headache.  Has also had productive cough.  Denies fever.  Patient is concerned that she has sinusitis.  She is also very concerned given that she has upcoming bariatric surgery planned.  She believes that she has sinusitis.  No relieving factors.  No other complaints.  Patient Active Problem List   Diagnosis Date Noted   Sinusitis 09/25/2022   Schatzki's ring 08/29/2022   Dysphagia 08/29/2022   Other hyperlipidemia 05/08/2022   Prediabetes 05/08/2022   Asthma 04/19/2022   Anxiety with depression 11/10/2020   Family history of colon cancer in mother 10/04/2020   Chronic idiopathic constipation 08/12/2020   Lower extremity edema 08/12/2020   OSA on CPAP 05/22/2020   Vitamin D deficiency 01/06/2020   Gastroesophageal reflux disease 01/06/2020   Migraine 10/01/2019   Nicotine abuse 10/01/2019   Morbid obesity ([place]) 10/01/2019   Essential hypertension 10/01/2019    Social Hx   Social History   Socioeconomic History   Marital status: Significant Other    Spouse name: Not on file   Number of children: Not on file   Years of education: Not on file   Highest education level: Some college, no degree  Occupational History   Occupation: Biochemist, clinical  Tobacco Use   Smoking status: Former    Types: E-cigarettes   Smokeless tobacco: Never  Scientific laboratory technician Use: Every day   Substances: Nicotine, CBD, Flavoring  Substance and Sexual Activity   Alcohol use: Yes    Comment: occ   Drug use: Yes    Frequency: 1.0 times per week    Types: Other-see comments    Comment: smokes CBD for pain  as needed   Sexual activity: Yes    Birth control/protection: None    Comment: has female partner  Other Topics Concern   Not on file  Social History Narrative   Lives with girlfriend [employer]ear Clock      Enjoys: likes to travel, not very outdoorsy, did like sports, close with family      Diet: eats all food-snacks alot   Caffeine: green tea, detox tea, rare soda, some coffee at [employer]: 3-4 bottles daily      Wears seat belt   Does not use phone while driving   Smoke detectors at home    Right handed   Social Determinants of Health   Financial Resource Strain: High Risk (09/25/2022)   Overall Financial Resource Strain (CARDIA)    Difficulty of Paying Living Expenses: Hard  Food Insecurity: Food Insecurity Present (09/25/2022)   Hunger Vital Sign    Worried About Running Out of Food in the Last Year: Often true    Ran Out of Food in the Last Year: Sometimes true  Transportation Needs: Patient Declined (09/25/2022)   PRAPARE - Transportation    Lack of Transportation (Medical): Patient declined    Lack of Transportation (Non-Medical): Patient declined  Physical Activity: Insufficiently Active (09/25/2022)   Exercise Vital Sign    Days of Exercise per Week: 2 days  Minutes of Exercise per Session: 40 min  Stress: Stress Concern Present (09/25/2022)   [place]    Feeling of Stress : To some extent  Social Connections: Unknown (09/25/2022)   Social Connection and Isolation Panel [NHANES]    Frequency of Communication with Friends and Family: Once a week    Frequency of Social Gatherings with Friends and Family: Once a week    Attends Religious Services: Patient declined    Marine scientist or Organizations: No    Attends Music therapist: Not on file    Marital Status: Patient declined    Review of Systems Per HPI  Objective:  BP 120/80   Pulse 84   Temp 98.1 F (36.7 C)    Ht 5\' 2"  (1.575 m)   Wt (!) 342 lb (155.1 kg)   SpO2 99%   BMI 62.55 kg/m      09/25/2022    3:53 PM 08/29/2022   11:14 AM 08/24/2022    1:50 PM  BP/Weight  Systolic BP 123456 [id number]   Diastolic BP 80 82   Wt. (Lbs) 342 338.8 337.9  BMI 62.55 kg/m2 61.97 kg/m2 61.8 kg/m2    Physical Exam Vitals and nursing note reviewed.  Constitutional:      General: She is not in acute distress.    Appearance: Normal appearance. She is obese.  HENT:     Head: Normocephalic and atraumatic.     Nose: Congestion present.     Mouth/Throat:     Pharynx: Oropharynx is clear.  Eyes:     General:        Right eye: No discharge.        Left eye: No discharge.     Conjunctiva/sclera: Conjunctivae normal.  Cardiovascular:     Rate and Rhythm: Normal rate and regular rhythm.  Pulmonary:     Effort: Pulmonary effort is normal.     Breath sounds: Normal breath sounds. No wheezing, rhonchi or rales.  Neurological:     Mental Status: She is alert.     Lab Results  Component Value Date   WBC 8.0 08/15/2021   HGB 13.6 08/15/2021   HCT 40.5 08/15/2021   PLT 369 08/15/2021   GLUCOSE 96 03/16/2022   CHOL 137 03/16/2022   TRIG 107 03/16/2022   HDL 33 (L) 03/16/2022   LDLCALC 84 03/16/2022   ALT 9 03/16/2022   AST 12 03/16/2022   NA 136 03/16/2022   K 4.0 03/16/2022   CL 98 03/16/2022   CREATININE 0.86 03/16/2022   BUN 8 03/16/2022   CO2 23 03/16/2022   TSH 3.340 08/15/2021   HGBA1C 5.5 03/16/2022     Assessment & Plan:   Problem List Items Addressed This Visit       Respiratory   Sinusitis - Primary    Treating with Augmentin.  Diflucan to be taken if she develops yeast vaginitis.      Relevant Medications   amoxicillin-clavulanate (AUGMENTIN) 875-125 MG tablet   fluconazole (DIFLUCAN) 150 MG tablet    Meds ordered this encounter  Medications   amoxicillin-clavulanate (AUGMENTIN) 875-125 MG tablet    Sig: Take 1 tablet by mouth 2 (two) times daily.    Dispense:  14 tablet     Refill:  0   fluconazole (DIFLUCAN) 150 MG tablet    Sig: Take 1 tablet (150 mg total) by mouth once for 1 dose. Repeat dose in 72 hours.  Dispense:  1 tablet    Refill:  1    Follow-up:  Return if symptoms worsen or fail to improve.  [place]

## 2022-10-04 ENCOUNTER — Other Ambulatory Visit: Payer: Self-pay | Admitting: Internal Medicine

## 2022-10-04 DIAGNOSIS — J329 Chronic sinusitis, unspecified: Secondary | ICD-10-CM

## 2022-10-05 ENCOUNTER — Encounter: Payer: Self-pay | Admitting: *Deleted

## 2022-10-05 ENCOUNTER — Telehealth: Payer: Self-pay | Admitting: *Deleted

## 2022-10-05 NOTE — Telephone Encounter (Signed)
Pt called back and states she is better and needs to reschedule her procedures. Pt has been scheduled for 11/06/22. New instructions mailed.

## 2022-10-09 ENCOUNTER — Encounter: Payer: 59 | Attending: General Surgery | Admitting: Skilled Nursing Facility1

## 2022-10-09 ENCOUNTER — Ambulatory Visit (INDEPENDENT_AMBULATORY_CARE_PROVIDER_SITE_OTHER): Payer: 59 | Admitting: Family Medicine

## 2022-10-10 ENCOUNTER — Encounter: Payer: Self-pay | Admitting: Skilled Nursing Facility1

## 2022-10-10 NOTE — Progress Notes (Signed)
Pre-Operative Nutrition Class:    Patient was seen on 10/09/2022 for Pre-Operative Bariatric Surgery Education at [facility]    Surgery date:  Surgery type: sleeve Start weight at [facility]: 337.9 Weight today: 338.1    The following the learning objectives were met by the patient during this course: Identify Pre-Op Dietary Goals and will begin 2 weeks pre-operatively Identify appropriate sources of fluids and proteins  State protein recommendations and appropriate sources pre and post-operatively Identify Post-Operative Dietary Goals and will follow for 2 weeks post-operatively Identify appropriate multivitamin and calcium sources Describe the need for physical activity post-operatively and will follow MD recommendations State when to call healthcare provider regarding medication questions or post-operative complications When having a diagnosis of diabetes understanding hypoglycemia symptoms and the inclusion of 1 complex carbohydrate per meal  Handouts given during class include: Pre-Op Bariatric Surgery Diet Handout Protein Shake Handout Post-Op Bariatric Surgery Nutrition Handout [facility] Program Information Flyer Support Group Information Flyer [facility] Bariatric Supplements Price List  Follow-Up Plan: Patient will follow-up at [facility] 2 weeks post operatively for diet advancement per MD.

## 2022-10-18 ENCOUNTER — Ambulatory Visit (INDEPENDENT_AMBULATORY_CARE_PROVIDER_SITE_OTHER): Payer: 59 | Admitting: Family Medicine

## 2022-10-30 NOTE — Patient Instructions (Signed)
Jacqueline Collins  10/30/2022     @PREFPERIOPPHARMACY @   Your procedure is scheduled on  11/06/2022.   Report to [name] at  1130  A.M.   Call this number if you have problems the morning of surgery:  [phone]  If you experience any cold or flu symptoms such as cough, fever, chills, shortness of breath, etc. between now and your scheduled surgery, please notify us at the above number.   Remember:  Follow the diet and prep instructions given to you by the office.     Your last dose of mounjaro should have been 10/29/2022.      DO NOT take any medications for diabetes the morning of your procedure.     Take these medicines the morning of surgery with A SIP OF WATER           flexeril(if needed), prozac, mobic (if needed), omeprazole, ubrelvy.     Do not wear jewelry, make-up or nail polish.  Do not wear lotions, powders, or perfumes, or deodorant.  Do not shave 48 hours prior to surgery.  Men may shave face and neck.  Do not bring valuables to the hospital.  [facility] is not responsible for any belongings or valuables.  Contacts, dentures or bridgework may not be worn into surgery.  Leave your suitcase in the car.  After surgery it may be brought to your room.  For patients admitted to the hospital, discharge time will be determined by your treatment team.  Patients discharged the day of surgery will not be allowed to drive home and must have someone with them for 24 hours.    Special instructions:   DO NOT smoke tobacco or vape for 24 hours before your procedure.  Please read over the following fact sheets that you were given. Anesthesia Post-op Instructions and Care and Recovery After Surgery         Upper Endoscopy, Adult, Care After After the procedure, it is common to have a sore throat. It is also common to have: Mild stomach pain or discomfort. Bloating. Nausea. Follow these instructions at home: The instructions below may help you care  for yourself at home. Your health care provider may give you more instructions. If you have questions, ask your health care provider. If you were given a sedative during the procedure, it can affect you for several hours. Do not drive or operate machinery until your health care provider says that it is safe. If you will be going home right after the procedure, plan to have a responsible adult: Take you home from the hospital or clinic. You will not be allowed to drive. Care for you for the time you are told. Follow instructions from your health care provider about what you may eat and drink. Return to your normal activities as told by your health care provider. Ask your health care provider what activities are safe for you. Take over-the-counter and prescription medicines only as told by your health care provider. Contact a health care provider if you: Have a sore throat that lasts longer than one day. Have trouble swallowing. Have a fever. Get help right away if you: Vomit blood or your vomit looks like coffee grounds. Have bloody, black, or tarry stools. Have a very bad sore throat or you cannot swallow. Have difficulty breathing or very bad pain in your chest or abdomen. These symptoms may be an emergency. Get help right away. Call 911. Do  not wait to see if the symptoms will go away. Do not drive yourself to the hospital. Summary After the procedure, it is common to have a sore throat, mild stomach discomfort, bloating, and nausea. If you were given a sedative during the procedure, it can affect you for several hours. Do not drive until your health care provider says that it is safe. Follow instructions from your health care provider about what you may eat and drink. Return to your normal activities as told by your health care provider. This information is not intended to replace advice given to you by your health care provider. Make sure you discuss any questions you have with your  health care provider. Document Revised: 09/28/2021 Document Reviewed: 09/28/2021 Elsevier Patient Education  2023 [employer] Esophageal Dilatation Esophageal dilatation, also called esophageal dilation, is a procedure to widen or open a blocked or narrowed part of the esophagus. The esophagus is the part of the body that moves food and liquid from the mouth to the stomach. You may need this procedure if: You have a buildup of scar tissue in your esophagus that makes it difficult, painful, or impossible to swallow. This can be caused by gastroesophageal reflux disease (GERD). You have cancer of the esophagus. There is a problem with how food moves through your esophagus. In some cases, you may need this procedure repeated at a later time to dilate the esophagus gradually. Tell a health care provider about: Any allergies you have. All medicines you are taking, including vitamins, herbs, eye drops, creams, and over-the-counter medicines. Any problems you or family members have had with anesthetic medicines. Any blood disorders you have. Any surgeries you have had. Any medical conditions you have. Any antibiotic medicines you are required to take before dental procedures. Whether you are pregnant or may be pregnant. What are the risks? Generally, this is a safe procedure. However, problems may occur, including: Bleeding due to a tear in the lining of the esophagus. A hole, or perforation, in the esophagus. What happens before the procedure? Ask your health care provider about: Changing or stopping your regular medicines. This is especially important if you are taking diabetes medicines or blood thinners. Taking medicines such as aspirin and ibuprofen. These medicines can thin your blood. Do not take these medicines unless your health care provider tells you to take them. Taking over-the-counter medicines, vitamins, herbs, and supplements. Follow instructions from your health care provider  about eating or drinking restrictions. Plan to have a responsible adult take you home from the hospital or clinic. Plan to have a responsible adult care for you for the time you are told after you leave the hospital or clinic. This is important. What happens during the procedure? You may be given a medicine to help you relax (sedative). A numbing medicine may be sprayed into the back of your throat, or you may gargle the medicine. Your health care provider may perform the dilatation using various surgical instruments, such as: Simple dilators. This instrument is carefully placed in the esophagus to stretch it. Guided wire bougies. This involves using an endoscope to insert a wire into the esophagus. A dilator is passed over this wire to enlarge the esophagus. Then the wire is removed. Balloon dilators. An endoscope with a small balloon is inserted into the esophagus. The balloon is inflated to stretch the esophagus and open it up. The procedure may vary among health care providers and hospitals. What can I expect after the procedure? Your blood pressure,  heart rate, breathing rate, and blood oxygen level will be monitored until you leave the hospital or clinic. Your throat may feel slightly sore and numb. This will get better over time. You will not be allowed to eat or drink until your throat is no longer numb. When you are able to drink, urinate, and sit on the edge of the bed without nausea or dizziness, you may be able to return home. Follow these instructions at home: Take over-the-counter and prescription medicines only as told by your health care provider. If you were given a sedative during the procedure, it can affect you for several hours. Do not drive or operate machinery until your health care provider says that it is safe. Plan to have a responsible adult care for you for the time you are told. This is important. Follow instructions from your health care provider about any eating or  drinking restrictions. Do not use any products that contain nicotine or tobacco, such as cigarettes, e-cigarettes, and chewing tobacco. If you need help quitting, ask your health care provider. Keep all follow-up visits. This is important. Contact a health care provider if: You have a fever. You have pain that is not relieved by medicine. Get help right away if: You have chest pain. You have trouble breathing. You have trouble swallowing. You vomit blood. You have black, tarry, or bloody stools. These symptoms may represent a serious problem that is an emergency. Do not wait to see if the symptoms will go away. Get medical help right away. Call your local emergency services (911 in the [place] Do not drive yourself to the hospital. Summary Esophageal dilatation, also called esophageal dilation, is a procedure to widen or open a blocked or narrowed part of the esophagus. Plan to have a responsible adult take you home from the hospital or clinic. For this procedure, a numbing medicine may be sprayed into the back of your throat, or you may gargle the medicine. Do not drive or operate machinery until your health care provider says that it is safe. This information is not intended to replace advice given to you by your health care provider. Make sure you discuss any questions you have with your health care provider. Document Revised: 11/05/2019 Document Reviewed: 11/05/2019 Elsevier Patient Education  2023 [employer] Colonoscopy, Adult, Care After The following information offers guidance on how to care for yourself after your procedure. Your health care provider may also give you more specific instructions. If you have problems or questions, contact your health care provider. What can I expect after the procedure? After the procedure, it is common to have: A small amount of blood in your stool for 24 hours after the procedure. Some gas. Mild cramping or bloating of your abdomen. Follow  these instructions at home: Eating and drinking  Drink enough fluid to keep your urine pale yellow. Follow instructions from your health care provider about eating or drinking restrictions. Resume your normal diet as told by your health care provider. Avoid heavy or fried foods that are hard to digest. Activity Rest as told by your health care provider. Avoid sitting for a long time without moving. Get up to take short walks every 1-2 hours. This is important to improve blood flow and breathing. Ask for help if you feel weak or unsteady. Return to your normal activities as told by your health care provider. Ask your health care provider what activities are safe for you. Managing cramping and bloating  Try walking around when  you have cramps or feel bloated. If directed, apply heat to your abdomen as told by your health care provider. Use the heat source that your health care provider recommends, such as a moist heat pack or a heating pad. Place a towel between your skin and the heat source. Leave the heat on for 20-30 minutes. Remove the heat if your skin turns bright red. This is especially important if you are unable to feel pain, heat, or cold. You have a greater risk of getting burned. General instructions If you were given a sedative during the procedure, it can affect you for several hours. Do not drive or operate machinery until your health care provider says that it is safe. For the first 24 hours after the procedure: Do not sign important documents. Do not drink alcohol. Do your regular daily activities at a slower pace than normal. Eat soft foods that are easy to digest. Take over-the-counter and prescription medicines only as told by your health care provider. Keep all follow-up visits. This is important. Contact a health care provider if: You have blood in your stool 2-3 days after the procedure. Get help right away if: You have more than a small spotting of blood in your  stool. You have large blood clots in your stool. You have swelling of your abdomen. You have nausea or vomiting. You have a fever. You have increasing pain in your abdomen that is not relieved with medicine. These symptoms may be an emergency. Get help right away. Call 911. Do not wait to see if the symptoms will go away. Do not drive yourself to the hospital. Summary After the procedure, it is common to have a small amount of blood in your stool. You may also have mild cramping and bloating of your abdomen. If you were given a sedative during the procedure, it can affect you for several hours. Do not drive or operate machinery until your health care provider says that it is safe. Get help right away if you have a lot of blood in your stool, nausea or vomiting, a fever, or increased pain in your abdomen. This information is not intended to replace advice given to you by your health care provider. Make sure you discuss any questions you have with your health care provider. Document Revised: 02/09/2021 Document Reviewed: 02/09/2021 Elsevier Patient Education  2023 [employer] Monitored Anesthesia Care, Care After The following information offers guidance on how to care for yourself after your procedure. Your health care provider may also give you more specific instructions. If you have problems or questions, contact your health care provider. What can I expect after the procedure? After the procedure, it is common to have: Tiredness. Little or no memory about what happened during or after the procedure. Impaired judgment when it comes to making decisions. Nausea or vomiting. Some trouble with balance. Follow these instructions at home: For the time period you were told by your health care provider:  Rest. Do not participate in activities where you could fall or become injured. Do not drive or use machinery. Do not drink alcohol. Do not take sleeping pills or medicines that cause  drowsiness. Do not make important decisions or sign legal documents. Do not take care of children on your own. Medicines Take over-the-counter and prescription medicines only as told by your health care provider. If you were prescribed antibiotics, take them as told by your health care provider. Do not stop using the antibiotic even if you start to feel  better. Eating and drinking Follow instructions from your health care provider about what you may eat and drink. Drink enough fluid to keep your urine pale yellow. If you vomit: Drink clear fluids slowly and in small amounts as you are able. Clear fluids include water, ice chips, low-calorie sports drinks, and fruit juice that has water added to it (diluted fruit juice). Eat light and bland foods in small amounts as you are able. These foods include bananas, applesauce, rice, lean meats, toast, and crackers. General instructions  Have a responsible adult stay with you for the time you are told. It is important to have someone help care for you until you are awake and alert. If you have sleep apnea, surgery and some medicines can increase your risk for breathing problems. Follow instructions from your health care provider about wearing your sleep device: When you are sleeping. This includes during daytime naps. While taking prescription pain medicines, sleeping medicines, or medicines that make you drowsy. Do not use any products that contain nicotine or tobacco. These products include cigarettes, chewing tobacco, and vaping devices, such as e-cigarettes. If you need help quitting, ask your health care provider. Contact a health care provider if: You feel nauseous or vomit every time you eat or drink. You feel light-headed. You are still sleepy or having trouble with balance after 24 hours. You get a rash. You have a fever. You have redness or swelling around the IV site. Get help right away if: You have trouble breathing. You have new  confusion after you get home. These symptoms may be an emergency. Get help right away. Call 911. Do not wait to see if the symptoms will go away. Do not drive yourself to the hospital. This information is not intended to replace advice given to you by your health care provider. Make sure you discuss any questions you have with your health care provider. Document Revised: 11/14/2021 Document Reviewed: 11/14/2021 Elsevier Patient Education  2023 [employer].

## 2022-11-01 ENCOUNTER — Encounter (HOSPITAL_COMMUNITY)
Admission: RE | Admit: 2022-11-01 | Discharge: 2022-11-01 | Disposition: A | Discharge status: Home / Self Care | Payer: 59 | Source: Ambulatory Visit | Attending: Internal Medicine | Admitting: Internal Medicine

## 2022-11-01 ENCOUNTER — Encounter (HOSPITAL_COMMUNITY): Payer: Self-pay

## 2022-11-01 VITALS — BP 117/70 | HR 92 | Temp 98.4°F | Resp 18 | Ht 62.0 in | Wt 338.2 lb

## 2022-11-01 DIAGNOSIS — R7303 Prediabetes: Secondary | ICD-10-CM | POA: Insufficient documentation

## 2022-11-01 DIAGNOSIS — Z01818 Encounter for other preprocedural examination: Secondary | ICD-10-CM | POA: Insufficient documentation

## 2022-11-01 HISTORY — DX: Prediabetes: R73.03

## 2022-11-01 LAB — BASIC METABOLIC PANEL
Anion gap: 8 (ref 5–15)
BUN: 8 mg/dL (ref 6–20)
CO2: 26 mmol/L (ref 22–32)
Calcium: 8.9 mg/dL (ref 8.9–10.3)
Chloride: 103 mmol/L (ref 98–111)
Creatinine, Ser: 0.8 mg/dL (ref 0.44–1.00)
GFR, Estimated: >60 mL/min (ref >60)
Glucose, Bld: 114 mg/dL — ABNORMAL HIGH (ref 70–99)
Potassium: 3.5 mmol/L (ref 3.5–5.1)
Sodium: 137 mmol/L (ref 135–145)

## 2022-11-01 LAB — POCT PREGNANCY, URINE: Preg Test, Ur: NEGATIVE

## 2022-11-06 ENCOUNTER — Other Ambulatory Visit: Payer: Self-pay

## 2022-11-06 ENCOUNTER — Encounter (HOSPITAL_COMMUNITY): Payer: Self-pay

## 2022-11-06 ENCOUNTER — Other Ambulatory Visit (INDEPENDENT_AMBULATORY_CARE_PROVIDER_SITE_OTHER): Payer: Self-pay | Admitting: Family Medicine

## 2022-11-06 ENCOUNTER — Encounter (HOSPITAL_COMMUNITY): Payer: Self-pay | Admitting: Emergency Medicine

## 2022-11-06 ENCOUNTER — Telehealth: Payer: Self-pay | Admitting: *Deleted

## 2022-11-06 ENCOUNTER — Encounter (HOSPITAL_COMMUNITY)
Admission: RE | Disposition: A | Discharge status: Home / Self Care | Payer: Self-pay | Source: Ambulatory Visit | Attending: Internal Medicine

## 2022-11-06 ENCOUNTER — Ambulatory Visit (HOSPITAL_COMMUNITY): Payer: 59 | Admitting: Certified Registered"

## 2022-11-06 ENCOUNTER — Ambulatory Visit (HOSPITAL_BASED_OUTPATIENT_CLINIC_OR_DEPARTMENT_OTHER): Payer: 59 | Admitting: Certified Registered"

## 2022-11-06 ENCOUNTER — Ambulatory Visit (HOSPITAL_COMMUNITY)
Admission: RE | Admit: 2022-11-06 | Discharge: 2022-11-06 | Disposition: A | Discharge status: Home / Self Care | Payer: 59 | Source: Ambulatory Visit | Attending: Internal Medicine | Admitting: Internal Medicine

## 2022-11-06 ENCOUNTER — Emergency Department (HOSPITAL_COMMUNITY)
Admission: EM | Admit: 2022-11-06 | Discharge: 2022-11-06 | Disposition: A | Discharge status: Home / Self Care | Payer: 59 | Source: Home / Self Care | Attending: Emergency Medicine | Admitting: Emergency Medicine

## 2022-11-06 DIAGNOSIS — R194 Change in bowel habit: Secondary | ICD-10-CM

## 2022-11-06 DIAGNOSIS — R1310 Dysphagia, unspecified: Secondary | ICD-10-CM | POA: Insufficient documentation

## 2022-11-06 DIAGNOSIS — Z87891 Personal history of nicotine dependence: Secondary | ICD-10-CM | POA: Insufficient documentation

## 2022-11-06 DIAGNOSIS — F419 Anxiety disorder, unspecified: Secondary | ICD-10-CM | POA: Insufficient documentation

## 2022-11-06 DIAGNOSIS — J45909 Unspecified asthma, uncomplicated: Secondary | ICD-10-CM | POA: Insufficient documentation

## 2022-11-06 DIAGNOSIS — R109 Unspecified abdominal pain: Secondary | ICD-10-CM | POA: Insufficient documentation

## 2022-11-06 DIAGNOSIS — K219 Gastro-esophageal reflux disease without esophagitis: Secondary | ICD-10-CM | POA: Diagnosis not present

## 2022-11-06 DIAGNOSIS — Z79899 Other long term (current) drug therapy: Secondary | ICD-10-CM | POA: Insufficient documentation

## 2022-11-06 DIAGNOSIS — F32A Depression, unspecified: Secondary | ICD-10-CM | POA: Insufficient documentation

## 2022-11-06 DIAGNOSIS — K449 Diaphragmatic hernia without obstruction or gangrene: Secondary | ICD-10-CM

## 2022-11-06 DIAGNOSIS — Z6841 Body Mass Index (BMI) 40.0 and over, adult: Secondary | ICD-10-CM | POA: Insufficient documentation

## 2022-11-06 DIAGNOSIS — K2970 Gastritis, unspecified, without bleeding: Secondary | ICD-10-CM

## 2022-11-06 DIAGNOSIS — I10 Essential (primary) hypertension: Secondary | ICD-10-CM | POA: Insufficient documentation

## 2022-11-06 DIAGNOSIS — R1013 Epigastric pain: Secondary | ICD-10-CM | POA: Diagnosis not present

## 2022-11-06 DIAGNOSIS — E559 Vitamin D deficiency, unspecified: Secondary | ICD-10-CM

## 2022-11-06 HISTORY — PX: ESOPHAGOGASTRODUODENOSCOPY (EGD) WITH PROPOFOL: SHX5813

## 2022-11-06 HISTORY — PX: COLONOSCOPY WITH PROPOFOL: SHX5780

## 2022-11-06 HISTORY — PX: BIOPSY: SHX5522

## 2022-11-06 LAB — GLUCOSE, CAPILLARY: Glucose-Capillary: 105 mg/dL — ABNORMAL HIGH (ref 70–99)

## 2022-11-06 SURGERY — COLONOSCOPY WITH PROPOFOL
Anesthesia: General

## 2022-11-06 MED ORDER — PANTOPRAZOLE SODIUM 40 MG PO TBEC: 40.0000 mg | DELAYED_RELEASE_TABLET | Freq: Every day | ORAL | 11 refills | Status: DC

## 2022-11-06 MED ORDER — PROPOFOL 10 MG/ML IV BOLUS
INTRAVENOUS | Status: DC | PRN
  Administered 2022-11-06: 50 mg via INTRAVENOUS
  Administered 2022-11-06: 80 mg via INTRAVENOUS
  Administered 2022-11-06: 20 mg via INTRAVENOUS

## 2022-11-06 MED ORDER — PROPOFOL 500 MG/50ML IV EMUL
INTRAVENOUS | Status: DC | PRN
  Administered 2022-11-06: 150 ug/kg/min via INTRAVENOUS

## 2022-11-06 MED ORDER — STERILE WATER FOR IRRIGATION IR SOLN
Status: DC | PRN
  Administered 2022-11-06: 60 mL

## 2022-11-06 MED ORDER — LACTATED RINGERS IV SOLN: INTRAVENOUS | Status: DC | PRN

## 2022-11-06 MED ORDER — LIDOCAINE HCL (CARDIAC) PF 100 MG/5ML IV SOSY
PREFILLED_SYRINGE | INTRAVENOUS | Status: DC | PRN
  Administered 2022-11-06: 50 mg via INTRAVENOUS

## 2022-11-06 MED ORDER — KETAMINE HCL 10 MG/ML IJ SOLN
INTRAMUSCULAR | Status: DC | PRN
  Administered 2022-11-06: 20 mg via INTRAVENOUS
  Administered 2022-11-06: 10 mg via INTRAVENOUS
  Administered 2022-11-06: 20 mg via INTRAVENOUS

## 2022-11-06 MED ORDER — KETAMINE HCL 50 MG/5ML IJ SOSY
PREFILLED_SYRINGE | INTRAMUSCULAR | Status: AC
  Filled 2022-11-06: qty 5
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
## 2022-11-06 NOTE — Op Note (Signed)
[facility] Patient Name: Jacqueline Collins Procedure Date: 11/06/2022 1:12 PM MRN: [id number] Date of Birth: [birth year] Attending MD: Hennie Duos. Marletta Lor , [place], [id number] CSN: [id number] Age: 37 Admit Type: Outpatient Procedure:                Upper GI endoscopy Indications:              Epigastric abdominal pain, Dysphagia, Heartburn Providers:                Hennie Duos. Marletta Lor, DO, Angelica Ran, Crystal Page Referring MD:              Medicines:                See the Anesthesia note for documentation of the                            administered medications Complications:            No immediate complications. Estimated Blood Loss:     Estimated blood loss was minimal. Procedure:                Pre-Anesthesia Assessment:                           - The anesthesia plan was to use monitored                            anesthesia care (MAC).                           After obtaining informed consent, the endoscope was                            passed under direct vision. Throughout the                            procedure, the patient's blood pressure, pulse, and                            oxygen saturations were monitored continuously. The                            [id number]-H190 ([id number]) scope was introduced through the                            mouth, and advanced to the second part of duodenum.                            The upper GI endoscopy was accomplished without                            difficulty. The patient tolerated the procedure                            well. Scope In: 1:30:04 PM Scope Out: 1:32:58 PM Total Procedure Duration: 0 hours 2 minutes 54 seconds  Findings:      The Z-line was regular and was  found 39 cm from the incisors.      There is no endoscopic evidence of stenosis or stricture in the entire       esophagus.      Patchy mild inflammation characterized by erythema was found in the       entire examined stomach. Biopsies were taken with a cold forceps for        Helicobacter pylori testing.      The duodenal bulb, first portion of the duodenum and second portion of       the duodenum were normal. Impression:               - Z-line regular, 39 cm from the incisors.                           - Gastritis. Biopsied.                           - Normal duodenal bulb, first portion of the                            duodenum and second portion of the duodenum. Moderate Sedation:      Per Anesthesia Care Recommendation:           - Patient has a contact number available for                            emergencies. The signs and symptoms of potential                            delayed complications were discussed with the                            patient. Return to normal activities tomorrow.                            Written discharge instructions were provided to the                            patient.                           - Resume previous diet.                           - Continue present medications.                           - Await pathology results.                           - Use Protonix (pantoprazole) 40 mg PO daily.                           - Return to GI clinic in 3 months. Procedure Code(s):        --- Professional ---  16109, Esophagogastroduodenoscopy, flexible,                            transoral; with biopsy, single or multiple Diagnosis Code(s):        --- Professional ---                           K29.70, Gastritis, unspecified, without bleeding                           R10.13, Epigastric pain                           R13.10, Dysphagia, unspecified                           R12, Heartburn CPT copyright 2022 American Medical Association. All rights reserved. The codes documented in this report are preliminary and upon coder review may  be revised to meet current compliance requirements. Hennie Duos. Marletta Lor, DO Hennie Duos. Marletta Lor, DO 11/06/2022 1:35:30 PM This report has been signed  electronically. Number of Addenda: 0

## 2022-11-06 NOTE — ED Notes (Addendum)
Pt has an endoscopy and colonoscopy scheduled for 1115 today to address her ongoing abdominal pain and constipation. She c/o upper abdominal pain that has improved since being here. She reports she was only able to drink half of the colon prep and has had about 6 BM's since starting prep. Denies blood in stool. Jacqueline Collins

## 2022-11-06 NOTE — Telephone Encounter (Signed)
Pt went to ED after having severe abd cramping after taking laxative and cramping. She was released back home and advised to complete prep. She tried and did half prep and is having mostly clear stools. She is not able to complete 2nd half. Secure chat sent to Dr. Marletta Lor

## 2022-11-06 NOTE — Discharge Instructions (Addendum)
EGD Discharge instructions Please read the instructions outlined below and refer to this sheet in the next few weeks. These discharge instructions provide you with general information on caring for yourself after you leave the hospital. Your doctor may also give you specific instructions. While your treatment has been planned according to the most current medical practices available, unavoidable complications occasionally occur. If you have any problems or questions after discharge, please call your doctor. ACTIVITY You may resume your regular activity but move at a slower pace for the next 24 hours.  Take frequent rest periods for the next 24 hours.  Walking will help expel (get rid of) the air and reduce the bloated feeling in your abdomen.  No driving for 24 hours (because of the anesthesia (medicine) used during the test).  You may shower.  Do not sign any important legal documents or operate any machinery for 24 hours (because of the anesthesia used during the test).  NUTRITION Drink plenty of fluids.  You may resume your normal diet.  Begin with a light meal and progress to your normal diet.  Avoid alcoholic beverages for 24 hours or as instructed by your caregiver.  MEDICATIONS You may resume your normal medications unless your caregiver tells you otherwise.  WHAT YOU CAN EXPECT TODAY You may experience abdominal discomfort such as a feeling of fullness or "gas" pains.  FOLLOW-UP Your doctor will discuss the results of your test with you.  SEEK IMMEDIATE MEDICAL ATTENTION IF ANY OF THE FOLLOWING OCCUR: Excessive nausea (feeling sick to your stomach) and/or vomiting.  Severe abdominal pain and distention (swelling).  Trouble swallowing.  Temperature over 101 F (37.8 C).  Rectal bleeding or vomiting of blood.      Colonoscopy Discharge Instructions  Read the instructions outlined below and refer to this sheet in the next few weeks. These discharge instructions provide you  with general information on caring for yourself after you leave the hospital. Your doctor may also give you specific instructions. While your treatment has been planned according to the most current medical practices available, unavoidable complications occasionally occur.   ACTIVITY You may resume your regular activity, but move at a slower pace for the next 24 hours.  Take frequent rest periods for the next 24 hours.  Walking will help get rid of the air and reduce the bloated feeling in your belly (abdomen).  No driving for 24 hours (because of the medicine (anesthesia) used during the test).   Do not sign any important legal documents or operate any machinery for 24 hours (because of the anesthesia used during the test).  NUTRITION Drink plenty of fluids.  You may resume your normal diet as instructed by your doctor.  Begin with a light meal and progress to your normal diet. Heavy or fried foods are harder to digest and may make you feel sick to your stomach (nauseated).  Avoid alcoholic beverages for 24 hours or as instructed.  MEDICATIONS You may resume your normal medications unless your doctor tells you otherwise.  WHAT YOU CAN EXPECT TODAY Some feelings of bloating in the abdomen.  Passage of more gas than usual.  Spotting of blood in your stool or on the toilet paper.  IF YOU HAD POLYPS REMOVED DURING THE COLONOSCOPY: No aspirin products for 7 days or as instructed.  No alcohol for 7 days or as instructed.  Eat a soft diet for the next 24 hours.  FINDING OUT THE RESULTS OF YOUR TEST Not all test results  are available during your visit. If your test results are not back during the visit, make an appointment with your caregiver to find out the results. Do not assume everything is normal if you have not heard from your caregiver or the medical facility. It is important for you to follow up on all of your test results.  SEEK IMMEDIATE MEDICAL ATTENTION IF: You have more than a  spotting of blood in your stool.  Your belly is swollen (abdominal distention).  You are nauseated or vomiting.  You have a temperature over 101.  You have abdominal pain or discomfort that is severe or gets worse throughout the day.   Your EGD revealed mild amount inflammation in your stomach.  I took biopsies of this to rule out infection with a bacteria called H. pylori.  Await pathology results, my office will contact you.  Esophagus was normal.  No tightenings or strictures seen today.  Did not need to dilate.  Small bowel appeared normal.  I am going to change your omeprazole to pantoprazole 40 mg daily.  Your colonoscopy was relatively unremarkable.  I did not find any polyps or evidence of colon cancer.  I did not find any active inflammation indicative of underlying inflammatory bowel disease such as Crohn's disease or ulcerative colitis.  I recommend repeating colonoscopy in 10 years for colon cancer screening purposes.    Continue on Linzess  Follow up in GI office in 2-3 months.   I hope you have a great rest of your week!  Hennie Duos. Marletta Lor, D.O. Gastroenterology and Hepatology [facility]

## 2022-11-06 NOTE — Transfer of Care (Signed)
Immediate Anesthesia Transfer of Care Note  Patient: Jacqueline Collins  Procedure(s) Performed: COLONOSCOPY WITH PROPOFOL ESOPHAGOGASTRODUODENOSCOPY (EGD) WITH PROPOFOL BIOPSY  Patient Location: Short Stay  Anesthesia Type:General  Level of Consciousness: awake  Airway & Oxygen Therapy: Patient Spontanous Breathing  Post-op Assessment: Report given to RN and Post -op Vital signs reviewed and stable  Post vital signs: Reviewed and stable  Last Vitals:  Vitals Value Taken Time  BP    Temp    Pulse    Resp    SpO2      Last Pain:  Vitals:   11/06/22 1324  TempSrc:   PainSc: 0-No pain         Complications: No notable events documented.

## 2022-11-06 NOTE — Op Note (Signed)
[facility] Patient Name: Jacqueline Collins Procedure Date: 11/06/2022 1:09 PM MRN: [id number] Date of Birth: Feb 24, 1986 Attending MD: Hennie Duos. Marletta Lor , [place], [id number] CSN: [id number] Age: 37 Admit Type: Outpatient Procedure:                Colonoscopy Indications:              Change in bowel habits Providers:                Hennie Duos. Marletta Lor, DO, Angelica Ran, Crystal Page Referring MD:              Medicines:                See the Anesthesia note for documentation of the                            administered medications Complications:            No immediate complications. Estimated Blood Loss:     Estimated blood loss: none. Procedure:                Pre-Anesthesia Assessment:                           - The anesthesia plan was to use monitored                            anesthesia care (MAC).                           After obtaining informed consent, the colonoscope                            was passed under direct vision. Throughout the                            procedure, the patient's blood pressure, pulse, and                            oxygen saturations were monitored continuously. The                            [id number]-HQ190L ([id number]) scope was introduced through                            the anus and advanced to the the cecum, identified                            by appendiceal orifice and ileocecal valve. The                            colonoscopy was performed without difficulty. The                            patient tolerated the procedure well. The quality                            of the  bowel preparation was evaluated using the                            BBPS [facility] Bowel Preparation Scale) with scores                            of: Right Colon = 3, Transverse Colon = 3 and Left                            Colon = 3 (entire mucosa seen well with no residual                            staining, small fragments of stool or opaque                             liquid). The total BBPS score equals 9. Scope In: 1:36:51 PM Scope Out: 1:45:47 PM Scope Withdrawal Time: 0 hours 7 minutes 32 seconds  Total Procedure Duration: 0 hours 8 minutes 56 seconds  Findings:      The terminal ileum appeared normal.      The colon (entire examined portion) appeared normal. Impression:               - The examined portion of the ileum was normal.                           - The entire examined colon is normal.                           - No specimens collected. Moderate Sedation:      Per Anesthesia Care Recommendation:           - Patient has a contact number available for                            emergencies. The signs and symptoms of potential                            delayed complications were discussed with the                            patient. Return to normal activities tomorrow.                            Written discharge instructions were provided to the                            patient.                           - Resume previous diet.                           - Continue present medications.                           -  Repeat colonoscopy in 10 years for screening                            purposes.                           - Return to GI clinic in 3 months. Procedure Code(s):        --- Professional ---                           [phone], Colonoscopy, flexible; diagnostic, including                            collection of specimen(s) by brushing or washing,                            when performed (separate procedure) Diagnosis Code(s):        --- Professional ---                           R19.4, Change in bowel habit CPT copyright 2022 American Medical Association. All rights reserved. The codes documented in this report are preliminary and upon coder review may  be revised to meet current compliance requirements. Hennie Duos. Marletta Lor, DO Hennie Duos. Marletta Lor, DO 11/06/2022 1:47:42 PM This report has been signed electronically. Number of  Addenda: 0
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
## 2022-11-06 NOTE — Anesthesia Procedure Notes (Signed)
Date/Time: 11/06/2022 1:35 PM  Performed by: Julian Reil, CRNAPre-anesthesia Checklist: Patient identified, Emergency Drugs available, Suction available and Patient being monitored Patient Re-evaluated:Patient Re-evaluated prior to induction Oxygen Delivery Method: Nasal cannula Induction Type: IV induction Placement Confirmation: positive ETCO2

## 2022-11-06 NOTE — Anesthesia Preprocedure Evaluation (Signed)
Anesthesia Evaluation  Patient identified by MRN, date of birth, ID band Patient awake    Reviewed: Allergy & Precautions, H&P , NPO status , Patient's Chart, lab work & pertinent test results, reviewed documented beta blocker date and time   Airway Mallampati: II  TM Distance: >3 FB Neck ROM: full    Dental no notable dental hx.    Pulmonary neg pulmonary ROS, asthma , sleep apnea , former smoker   Pulmonary exam normal breath sounds clear to auscultation       Cardiovascular Exercise Tolerance: Good hypertension, negative cardio ROS  Rhythm:regular Rate:Normal     Neuro/Psych  Headaches PSYCHIATRIC DISORDERS Anxiety Depression    negative neurological ROS  negative psych ROS   GI/Hepatic negative GI ROS, Neg liver ROS,GERD  ,,  Endo/Other    Morbid obesity  Renal/GU negative Renal ROS  negative genitourinary   Musculoskeletal   Abdominal   Peds  Hematology negative hematology ROS (+)   Anesthesia Other Findings   Reproductive/Obstetrics negative OB ROS                             Anesthesia Physical Anesthesia Plan  ASA: 3  Anesthesia Plan: General   Post-op Pain Management:    Induction:   PONV Risk Score and Plan: Propofol infusion  Airway Management Planned:   Additional Equipment:   Intra-op Plan:   Post-operative Plan:   Informed Consent: I have reviewed the patients History and Physical, chart, labs and discussed the procedure including the risks, benefits and alternatives for the proposed anesthesia with the patient or authorized representative who has indicated his/her understanding and acceptance.     Dental Advisory Given  Plan Discussed with: CRNA  Anesthesia Plan Comments:        Anesthesia Quick Evaluation

## 2022-11-06 NOTE — ED Notes (Signed)
Dr Wickline at bedside. Jacqueline Collins Jacqueline Rockman RN 

## 2022-11-06 NOTE — ED Triage Notes (Signed)
Pt with c/o upper abdominal pain/cramping since starting her prep for her colonoscopy that is scheduled for this morning.

## 2022-11-06 NOTE — Discharge Instructions (Signed)

## 2022-11-07 ENCOUNTER — Telehealth: Payer: Self-pay

## 2022-11-07 LAB — SURGICAL PATHOLOGY

## 2022-11-07 NOTE — Transitions of Care (Post Inpatient/ED Visit) (Signed)
   11/07/2022  Name: Jacqueline Collins MRN: [id number] DOB: [birth year]  Today's TOC FU Call Status: Today's TOC FU Call Status:: Unsuccessul Call (1st Attempt) Unsuccessful Call (1st Attempt) Date: 11/07/22  Attempted to reach the patient regarding the most recent Inpatient/ED visit.  Follow Up Plan: Additional outreach attempts will be made to reach the patient to complete the Transitions of Care (Post Inpatient/ED visit) call.   Signature Karena Addison, LPN [facility] Nurse Health Advisor Direct Dial [phone]

## 2022-11-07 NOTE — Anesthesia Postprocedure Evaluation (Signed)
Anesthesia Post Note  Patient: Jacqueline Collins  Procedure(s) Performed: COLONOSCOPY WITH PROPOFOL ESOPHAGOGASTRODUODENOSCOPY (EGD) WITH PROPOFOL BIOPSY  Patient location during evaluation: Phase II Anesthesia Type: General Level of consciousness: awake Pain management: pain level controlled Vital Signs Assessment: post-procedure vital signs reviewed and stable Respiratory status: spontaneous breathing and respiratory function stable Cardiovascular status: blood pressure returned to baseline and stable Postop Assessment: no headache and no apparent nausea or vomiting Anesthetic complications: no Comments: Late entry   No notable events documented.   Last Vitals:  Vitals:   11/06/22 1154 11/06/22 1351  BP: 116/77 105/72  Pulse: 86 86  Resp: 18 14  Temp: 36.7 C   SpO2: 100% 99%    Last Pain:  Vitals:   11/06/22 1351  TempSrc:   PainSc: 0-No pain                 Windell Norfolk

## 2022-11-08 ENCOUNTER — Telehealth: Payer: Self-pay

## 2022-11-08 NOTE — Telephone Encounter (Signed)
Pt called needing to speak with Dr Marletta Lor regarding a procedure (Gastric Sleeve) she is having done. Sent to [place] because she seen the pt last and may have input to include.  Dr Marletta Lor the pt stated she was advised by the surgeon to have a conversation with you regarding it. Pt really didn't give me much information. Please call the pt. The procedure was scheduled for 11/20/2022 but she canceled it until she speaks with you.

## 2022-11-08 NOTE — Telephone Encounter (Signed)
Jacqueline Collins, since patient requesting to speak to Dr. Marletta Lor and he saw patient two days ago at time of her procedures, I will let him discuss with patient.

## 2022-11-09 NOTE — Telephone Encounter (Signed)
I have tried to call patient back multiple times with no answer.  I will try again tomorrow.

## 2022-11-09 NOTE — Telephone Encounter (Signed)
noted 

## 2022-11-10 NOTE — Telephone Encounter (Signed)
Message from [facility] @ [phone] left on the phone making sure you are aware of the pt needing to speak with you. They would also like to speak with you.

## 2022-11-13 ENCOUNTER — Encounter (HOSPITAL_COMMUNITY): Payer: Self-pay | Admitting: Internal Medicine

## 2022-11-14 NOTE — Transitions of Care (Post Inpatient/ED Visit) (Signed)
11/14/2022  Name: Jacqueline Collins MRN: [id number] DOB: [birth year]  Today's TOC FU Call Status: Today's TOC FU Call Status:: Successful TOC FU Call Competed Unsuccessful Call (1st Attempt) Date: 11/07/22 [facility] FU Call Complete Date: 11/14/22  Transition Care Management Follow-up Telephone Call Date of Discharge: 11/06/22 [facility] Type of Discharge: Emergency Department Reason for ED Visit: Other: (chest pain) How have you been since you were released from the hospital?: Better Any questions or concerns?: No  Items Reviewed: Did you receive and understand the discharge instructions provided?: Yes Medications obtained,verified, and reconciled?: Yes (Medications Reviewed) Any new allergies since your discharge?: No Dietary orders reviewed?: Yes Do you have support at home?: No  Medications Reviewed Today: Medications Reviewed Today     Reviewed by Karena Addison, LPN (Licensed Practical Nurse) on 11/14/22 at 1341  Med List Status: <None>   Medication Order Taking? Sig Documenting Provider Last Dose Status Informant  albuterol (VENTOLIN HFA) 108 (90 Base) MCG/ACT inhaler [id number] Yes Inhale 1-2 puffs into the lungs every 6 (six) hours as needed for wheezing or shortness of breath. Tommie Sams, DO Taking Active Self  carboxymethylcellulose (REFRESH PLUS) 0.5 % SOLN [id number] Yes Place 1 drop into both eyes 3 (three) times daily as needed (dry eyes). [provider] Taking Active Self  FLUoxetine (PROZAC) 20 MG tablet [id number] Yes Take 1 tablet (20 mg total) by mouth daily. Helane Rima, DO Taking Active Self  fluticasone (FLONASE) 50 MCG/ACT nasal spray [id number] Yes Place 2 sprays into both nostrils daily.  Patient taking differently: Place 2 sprays into both nostrils daily as needed for allergies.   Couture, Cortni S, PA-C Taking Active Self  furosemide (LASIX) 20 MG tablet [id number] Yes Take 1 tablet (20 mg total) by mouth daily. [name], MD Taking Active Self  Galcanezumab-gnlm [facility]) 120 MG/ML Ivory Broad [id number] No Inject 120 mg into the skin every 30 (thirty) days.  Patient not taking: Reported on 11/14/2022   Shawnie Dapper, NP Not Taking Active Self           Med Note Alphonzo [place]   Tue Oct 31, 2022  9:31 AM) On hold for procedure  guaiFENesin (MUCINEX) 600 MG 12 hr tablet [id number] Yes Take 600-1,200 mg by mouth 2 (two) times daily as needed for to loosen phlegm or cough. [provider] Taking Active Self  hydrocortisone cream 1 % [id number] Yes Apply 1 Application topically daily as needed for itching. [provider] Taking Active Self  linaclotide [facility]) 145 MCG CAPS capsule [id number] Yes Take 1 capsule (145 mcg total) by mouth daily before breakfast.  Patient taking differently: Take 145 mcg by mouth every other day.   Tiffany Kocher, PA-C Taking Active Self  meloxicam (MOBIC) 7.5 MG tablet [id number] Yes TAKE 1 TABLET BY MOUTH ONCE DAILY AS NEEDED FOR PAIN Vivi Barrack, DPM Taking Active Self  montelukast (SINGULAIR) 10 MG tablet [id number] Yes TAKE 1 TABLET BY MOUTH AT BEDTIME [name], MD Taking Active Self  Multiple Vitamins-Minerals (WOMENS MULTIVITAMIN) TABS [id number] Yes Take 1 tablet by mouth daily. [provider] Taking Active Self  NON FORMULARY [id number] Yes Pt uses cpap nightly [provider] Taking Active Self  pantoprazole (PROTONIX) 40 MG tablet [id number] Yes Take 1 tablet (40 mg total) by mouth daily. Lanelle Bal, DO Taking Active   potassium chloride SA (KLOR-CON M) 20 MEQ tablet [id number] Yes Take 1 tablet by mouth once daily Allena Katz, Rutwik  K, MD Taking Active Self  rosuvastatin (CRESTOR) 10 MG tablet [id number] Yes Take 1 tablet (10 mg total) by mouth daily. [name], MD Taking Active Self  tirzepatide [facility]) 5 MG/0.5ML Pen [id number] No Inject 5 mg into the skin once a week.  Patient not taking: Reported on 11/14/2022    Whitmire, Thermon Leyland, FNP Not Taking Active Self           Med Note Alphonzo [place]   Tue Oct 31, 2022  9:31 AM) On hold for procedure  Ubrogepant (UBRELVY) 100 MG TABS [id number] Yes Take 100 mg by mouth daily as needed. Take one tablet at onset of headache, may repeat 1 tablet in 2 hours, no more than 2 tablets in 24 hours Lomax, Amy, NP Taking Active Self  Vitamin D, Ergocalciferol, (DRISDOL) 1.25 MG (50000 UNIT) CAPS capsule [id number] Yes TAKE 1 CAPSULE BY MOUTH TWICE A WEEK ON SUNDAYS AND WEDNESDAYS Whitmire, Thermon Leyland, FNP Taking Active             Home Care and Equipment/Supplies: Were Home Health Services Ordered?: NA Any new equipment or medical supplies ordered?: NA  Functional Questionnaire: Do you need assistance with bathing/showering or dressing?: No Do you need assistance with meal preparation?: No Do you need assistance with eating?: No Do you have difficulty maintaining continence: No Do you need assistance with getting out of bed/getting out of a chair/moving?: No Do you have difficulty managing or taking your medications?: No  Follow up appointments reviewed: PCP Follow-up appointment confirmed?: NA Specialist Hospital Follow-up appointment confirmed?: NA Do you need transportation to your follow-up appointment?: No Do you understand care options if your condition(s) worsen?: Yes-patient verbalized understanding    SIGNATURE Karena Addison, LPN [facility] Nurse Health Advisor Direct Dial [phone]

## 2022-11-15 ENCOUNTER — Ambulatory Visit: Payer: 59 | Admitting: Family Medicine

## 2022-11-16 ENCOUNTER — Other Ambulatory Visit: Payer: Self-pay | Admitting: Family Medicine

## 2022-11-16 ENCOUNTER — Telehealth: Payer: Self-pay

## 2022-11-16 DIAGNOSIS — G43709 Chronic migraine without aura, not intractable, without status migrainosus: Secondary | ICD-10-CM

## 2022-11-16 NOTE — Telephone Encounter (Signed)
Prescription Request  11/16/2022  LOV: Visit date not found  What is the name of the medication or equipment? Galcanezumab-gnlm (EMGALITY) 120 MG/ML SOAJ   Have you contacted your pharmacy to request a refill? Yes   Which pharmacy would you like this sent to?  [facility] [address] [place] - [address] [place] [address] Phone: [phone] Fax: [phone]    Patient notified that their request is being sent to the clinical staff for review and that they should receive a response within 2 business days.   Please advise at Mobile [phone] (mobile)

## 2022-11-20 ENCOUNTER — Encounter (HOSPITAL_COMMUNITY): Admission: RE | Payer: Self-pay | Source: Ambulatory Visit

## 2022-11-20 ENCOUNTER — Ambulatory Visit (HOSPITAL_COMMUNITY): Admission: RE | Admit: 2022-11-20 | Payer: 59 | Source: Ambulatory Visit | Admitting: General Surgery

## 2022-11-20 SURGERY — GASTRECTOMY, SLEEVE, LAPAROSCOPIC
Anesthesia: General

## 2022-11-21 ENCOUNTER — Ambulatory Visit: Payer: 59 | Admitting: Family Medicine

## 2022-11-21 NOTE — Telephone Encounter (Signed)
Everlene Other G, DO     I went to prescribe it and it flagged me due to patient's medication allergies.

## 2022-11-21 NOTE — Telephone Encounter (Signed)
Left message to return call 

## 2022-11-22 NOTE — Telephone Encounter (Signed)
Returning Autumn call

## 2022-11-23 NOTE — Telephone Encounter (Signed)
Everlene Other G, DO     Migraine meds were prescribed by neurology. I would recommend reaching out to them. What is not affordable regarding her asthma treatment?

## 2022-11-23 NOTE — Telephone Encounter (Signed)
Left message for patient to return call.

## 2022-11-23 NOTE — Telephone Encounter (Addendum)
Patient states she takes it and gets a rash from it and has to take meds but has to have something for migraines. Patient is open to try something different  Patient also stated she could not afford med for asthma and needs something cheaper

## 2022-11-24 NOTE — Telephone Encounter (Signed)
Left message to return call 

## 2022-11-24 NOTE — Patient Instructions (Addendum)
DUE TO COVID-19 ONLY TWO VISITORS  (aged 36 and older)  ARE ALLOWED TO COME WITH YOU AND STAY IN THE WAITING ROOM ONLY DURING PRE OP AND PROCEDURE.   **NO VISITORS ARE ALLOWED IN THE SHORT STAY AREA OR RECOVERY ROOM!!**  IF YOU WILL BE ADMITTED INTO THE HOSPITAL YOU ARE ALLOWED ONLY FOUR SUPPORT PEOPLE DURING VISITATION HOURS ONLY (7 AM -8PM)   The support person(s) must pass our screening, gel in and out, and wear a mask at all times, including in the patient's room. Patients must also wear a mask when staff or their support person are in the room. Visitors GUEST BADGE MUST BE WORN VISIBLY  One adult visitor may remain with you overnight and MUST be in the room by 8 P.M.     Your procedure is scheduled on: 12/25/22   Report to [facility] Main Entrance    Report to admitting at 8:45  AM   Call this number if you have problems the morning of surgery [phone]   Do not eat food :After Midnight.   After Midnight you may have the following liquids until _8:00_____ AM/  DAY OF SURGERY  Water Black Coffee (sugar ok, NO MILK/CREAM OR CREAMERS)  Tea (sugar ok, NO MILK/CREAM OR CREAMERS) regular and decaf                             Plain Jell-O (NO RED)                                           Fruit ices (not with fruit pulp, NO RED)                                     Popsicles (NO RED)                                                                  Juice: apple, WHITE grape, WHITE cranberry Sports drinks like Gatorade (NO RED)                  The day of surgery:  Drink ONE  G2 at 7:45 AM the morning of surgery. Drink in one sitting. Do not sip.  This drink was given to you during your hospital  pre-op appointment visit. Nothing else to drink after completing the   G2. At 8:00 AM          If you have questions, please contact your surgeon's office.      Oral Hygiene is also important to reduce your risk of infection.                                    Remember -  BRUSH YOUR TEETH THE MORNING OF SURGERY WITH YOUR REGULAR TOOTHPASTE    Do NOT smoke after Midnight   Take these medicines the morning of surgery with A SIP OF WATER: Use your inhaler and bring it with you  Prozac                                                                                                                           Rosuvastatin                                                                                                                           Linzess                                                                                                                           Pantoprazole  DO NOT TAKE ANY ORAL DIABETIC MEDICATIONS DAY OF YOUR SURGERY Hold Moungaro for 7 days prior to day of surgery.  No dose after6/16/24  Bring CPAP mask and tubing day of surgery.                              You may not have any metal on your body including hair pins, jewelry, and body piercing             Do not wear make-up, lotions, powders, perfumes/cologne, or deodorant  Do not wear nail polish including gel and S&S, artificial/acrylic nails, or any other type of covering on natural nails including finger and toenails. If you have artificial nails, gel coating, etc. that needs to be removed by a nail salon please have this removed prior to surgery or surgery may need to be canceled/ delayed if the surgeon/ anesthesia feels like they are unable to be safely monitored.   Do not shave  48 hours prior to surgery.     Do not bring valuables to the hospital. [facility] IS NOT             RESPONSIBLE   FOR VALUABLES.   Contacts, glasses, or bridgework may not be worn into surgery.   Bring small  overnight bag day of surgery.   DO NOT BRING YOUR HOME MEDICATIONS TO THE HOSPITAL. PHARMACY WILL DISPENSE MEDICATIONS LISTED ON YOUR MEDICATION LIST TO YOU DURING YOUR  ADMISSION IN THE HOSPITAL!     Special Instructions: Bring a copy of your healthcare power of attorney and living will documents the day of surgery if you haven't scanned them before.              Please read over the following fact sheets you were given: IF YOU HAVE QUESTIONS ABOUT YOUR PRE-OP INSTRUCTIONS PLEASE CALL [phone]    [facility] - Preparing for Surgery Before surgery, you can play an important role.  Because skin is not sterile, your skin needs to be as free of germs as possible.  You can reduce the number of germs on your skin by washing with CHG (chlorahexidine gluconate) soap before surgery.  CHG is an antiseptic cleaner which kills germs and bonds with the skin to continue killing germs even after washing. Please DO NOT use if you have an allergy to CHG or antibacterial soaps.  If your skin becomes reddened/irritated stop using the CHG and inform your nurse when you arrive at Short Stay. Do not shave (including legs and underarms) for at least 48 hours prior to the first CHG shower.  Please follow these instructions carefully:  1.  Shower with CHG Soap the night before surgery and the  morning of Surgery.  2.  If you choose to wash your hair, wash your hair first as usual with your  normal  shampoo.  3.  After you shampoo, rinse your hair and body thoroughly to remove the  shampoo.                            4.  Use CHG as you would any other liquid soap.  You can apply chg directly  to the skin and wash                       Gently with a scrungie or clean washcloth.  5.  Apply the CHG Soap to your body ONLY FROM THE NECK DOWN.   Do not use on face/ open                           Wound or open sores. Avoid contact with eyes, ears mouth and genitals (private parts).                       Wash face,  Genitals (private parts) with your normal soap.             6.  Wash thoroughly, paying special attention to the area where your surgery  will be performed.  7.  Thoroughly  rinse your body with warm water from the neck down.  8.  DO NOT shower/wash with your normal soap after using and rinsing off  the CHG Soap.             9.  Pat yourself dry with a clean towel.            10.  Wear clean pajamas.            11.  Place clean sheets on your bed the night of your first shower and do not  sleep with pets. Day of Surgery : Do not apply any  lotions/deodorants the morning of surgery.  Please wear clean clothes to the hospital/surgery center.  FAILURE TO FOLLOW THESE INSTRUCTIONS MAY RESULT IN THE CANCELLATION OF YOUR SURGERY  PATIENT SIGNATURE_________________________________   ________________________________________________________________________  Jacqueline Collins  An incentive spirometer is a tool that can help keep your lungs clear and active. This tool measures how well you are filling your lungs with each breath. Taking long deep breaths may help reverse or decrease the chance of developing breathing (pulmonary) problems (especially infection) following: A long period of time when you are unable to move or be active. BEFORE THE PROCEDURE  If the spirometer includes an indicator to show your best effort, your nurse or respiratory therapist will set it to a desired goal. If possible, sit up straight or lean slightly forward. Try not to slouch. Hold the incentive spirometer in an upright position. INSTRUCTIONS FOR USE  Sit on the edge of your bed if possible, or sit up as far as you can in bed or on a chair. Hold the incentive spirometer in an upright position. Breathe out normally. Place the mouthpiece in your mouth and seal your lips tightly around it. Breathe in slowly and as deeply as possible, raising the piston or the ball toward the top of the column. Hold your breath for 3-5 seconds or for as long as possible. Allow the piston or ball to fall to the bottom of the column. Remove the mouthpiece from your mouth and breathe out normally. Rest for  a few seconds and repeat Steps 1 through 7 at least 10 times every 1-2 hours when you are awake. Take your time and take a few normal breaths between deep breaths. The spirometer may include an indicator to show your best effort. Use the indicator as a goal to work toward during each repetition. After each set of 10 deep breaths, practice coughing to be sure your lungs are clear. If you have an incision (the cut made at the time of surgery), support your incision when coughing by placing a pillow or rolled up towels firmly against it. Once you are able to get out of bed, walk around indoors and cough well. You may stop using the incentive spirometer when instructed by your caregiver.  RISKS AND COMPLICATIONS Take your time so you do not get dizzy or light-headed. If you are in pain, you may need to take or ask for pain medication before doing incentive spirometry. It is harder to take a deep breath if you are having pain. AFTER USE Rest and breathe slowly and easily. It can be helpful to keep track of a log of your progress. Your caregiver can provide you with a simple table to help with this. If you are using the spirometer at home, follow these instructions: SEEK MEDICAL CARE IF:  You are having difficultly using the spirometer. You have trouble using the spirometer as often as instructed. Your pain medication is not giving enough relief while using the spirometer. You develop fever of 100.5 F (38.1 C) or higher. SEEK IMMEDIATE MEDICAL CARE IF:  You cough up bloody sputum that had not been present before. You develop fever of 102 F (38.9 C) or greater. You develop worsening pain at or near the incision site. MAKE SURE YOU:  Understand these instructions. Will watch your condition. Will get help right away if you are not doing well or get worse. Document Released: 10/30/2006 Document Revised: 09/11/2011 Document Reviewed: 12/31/2006 [facility] Patient Information 2014 [place],  [place].   ________________________________________________________________________

## 2022-11-24 NOTE — Telephone Encounter (Signed)
Patient states she does not see the neurologist anymore and will need to find a new on and the asthma med was a pill to help her not have to use her inhaler

## 2022-11-27 ENCOUNTER — Other Ambulatory Visit: Payer: Self-pay | Admitting: Family Medicine

## 2022-11-28 NOTE — Telephone Encounter (Signed)
Jacqueline Sams, DO     The Singulair is generic and should be affordable. If not, we can send in a controller inhaler. In regards to her migraine medication, I would advise she contact her insurance company to see what is  on formulary for preventative treatment.

## 2022-11-29 ENCOUNTER — Encounter (HOSPITAL_COMMUNITY): Payer: Self-pay

## 2022-11-30 ENCOUNTER — Encounter: Payer: Self-pay | Admitting: *Deleted

## 2022-11-30 NOTE — Telephone Encounter (Signed)
Patient notified via mychart

## 2022-12-05 ENCOUNTER — Ambulatory Visit: Payer: Self-pay | Admitting: General Surgery

## 2022-12-05 ENCOUNTER — Ambulatory Visit: Payer: 59

## 2022-12-05 DIAGNOSIS — I10 Essential (primary) hypertension: Secondary | ICD-10-CM

## 2022-12-05 NOTE — Progress Notes (Signed)
Sent message, via epic in basket, requesting orders in epic from surgeon.  

## 2022-12-06 ENCOUNTER — Other Ambulatory Visit: Payer: Self-pay | Admitting: Family Medicine

## 2022-12-06 ENCOUNTER — Encounter (HOSPITAL_COMMUNITY): Payer: Self-pay

## 2022-12-06 ENCOUNTER — Other Ambulatory Visit (INDEPENDENT_AMBULATORY_CARE_PROVIDER_SITE_OTHER): Payer: Self-pay | Admitting: Family Medicine

## 2022-12-06 DIAGNOSIS — E559 Vitamin D deficiency, unspecified: Secondary | ICD-10-CM

## 2022-12-07 ENCOUNTER — Other Ambulatory Visit: Payer: Self-pay

## 2022-12-11 ENCOUNTER — Encounter: Payer: Self-pay | Admitting: Family Medicine

## 2022-12-12 ENCOUNTER — Encounter (HOSPITAL_COMMUNITY)
Admission: RE | Admit: 2022-12-12 | Discharge: 2022-12-12 | Disposition: A | Discharge status: Home / Self Care | Payer: 59 | Source: Ambulatory Visit | Attending: General Surgery | Admitting: General Surgery

## 2022-12-12 ENCOUNTER — Other Ambulatory Visit: Payer: Self-pay

## 2022-12-12 ENCOUNTER — Encounter (HOSPITAL_COMMUNITY): Payer: Self-pay

## 2022-12-12 VITALS — BP 129/92 | HR 70 | Temp 98.8°F | Resp 18 | Ht 62.0 in | Wt 333.0 lb

## 2022-12-12 DIAGNOSIS — I10 Essential (primary) hypertension: Secondary | ICD-10-CM | POA: Insufficient documentation

## 2022-12-12 DIAGNOSIS — Z01812 Encounter for preprocedural laboratory examination: Secondary | ICD-10-CM | POA: Diagnosis not present

## 2022-12-12 DIAGNOSIS — R7303 Prediabetes: Secondary | ICD-10-CM | POA: Insufficient documentation

## 2022-12-12 HISTORY — DX: Irritable bowel syndrome, unspecified: K58.9

## 2022-12-12 LAB — COMPREHENSIVE METABOLIC PANEL
ALT: 12 U/L (ref 0–44)
AST: 16 U/L (ref 15–41)
Albumin: 3.9 g/dL (ref 3.5–5.0)
Alkaline Phosphatase: 81 U/L (ref 38–126)
Anion gap: 8 (ref 5–15)
BUN: 17 mg/dL (ref 6–20)
CO2: 23 mmol/L (ref 22–32)
Calcium: 9.1 mg/dL (ref 8.9–10.3)
Chloride: 105 mmol/L (ref 98–111)
Creatinine, Ser: 0.82 mg/dL (ref 0.44–1.00)
GFR, Estimated: >60 mL/min (ref >60)
Glucose, Bld: 107 mg/dL — ABNORMAL HIGH (ref 70–99)
Potassium: 4.2 mmol/L (ref 3.5–5.1)
Sodium: 136 mmol/L (ref 135–145)
Total Bilirubin: 0.9 mg/dL (ref 0.3–1.2)
Total Protein: 7.5 g/dL (ref 6.5–8.1)

## 2022-12-12 LAB — CBC WITH DIFFERENTIAL/PLATELET
Abs Immature Granulocytes: 0.02 10*3/uL (ref 0.00–0.07)
Basophils Absolute: 0 10*3/uL (ref 0.0–0.1)
Basophils Relative: 0 %
Eosinophils Absolute: 0.2 10*3/uL (ref 0.0–0.5)
Eosinophils Relative: 2 %
HCT: 39 % (ref 36.0–46.0)
Hemoglobin: 13 g/dL (ref 12.0–15.0)
Immature Granulocytes: 0 %
Lymphocytes Relative: 29 %
Lymphs Abs: 2.7 10*3/uL (ref 0.7–4.0)
MCH: 31.5 pg (ref 26.0–34.0)
MCHC: 33.3 g/dL (ref 30.0–36.0)
MCV: 94.4 fL (ref 80.0–100.0)
Monocytes Absolute: 0.5 10*3/uL (ref 0.1–1.0)
Monocytes Relative: 5 %
Neutro Abs: 5.8 10*3/uL (ref 1.7–7.7)
Neutrophils Relative %: 64 %
Platelets: 355 10*3/uL (ref 150–400)
RBC: 4.13 MIL/uL (ref 3.87–5.11)
RDW: 13.5 % (ref 11.5–15.5)
WBC: 9.1 10*3/uL (ref 4.0–10.5)
nRBC: 0 % (ref 0.0–0.2)

## 2022-12-12 LAB — TYPE AND SCREEN
ABO/RH(D): O POS
Antibody Screen: NEGATIVE

## 2022-12-12 LAB — HEMOGLOBIN A1C
Hgb A1c MFr Bld: 5.1 % (ref 4.8–5.6)
Mean Plasma Glucose: 99.67 mg/dL

## 2022-12-12 NOTE — Progress Notes (Signed)
Anesthesia note:    PCP - Everlene Other DO Cardiologist -none Other-   Chest x-ray - 08/11/22-epic EKG - 08/11/22-epic Stress Test -  ECHO - no Cardiac Cath - no CABG-no Pacemaker/ICD device last checked:NA  Sleep Study - yes CPAP - yes  Pt is pre diabetic-yes hasn't taken Mounjaro since 11/08/22 CBG at PAT visit-no Fasting Blood Sugar at home-no Checks Blood Sugar no_____  Blood Thinner:no Blood Thinner Instructions: Aspirin Instructions: Last Dose:  Anesthesia review:  /No  reason:  Patient denies shortness of breath, fever, cough and chest pain at PAT appointment. Pt is 333 lbs. She is SOB with exertion.    Patient verbalized understanding of instructions that were given to them at the PAT appointment. Patient was also instructed that they will need to review over the PAT instructions again at home before surgery.yes

## 2022-12-18 ENCOUNTER — Other Ambulatory Visit: Payer: Self-pay | Admitting: Family Medicine

## 2022-12-18 DIAGNOSIS — G43709 Chronic migraine without aura, not intractable, without status migrainosus: Secondary | ICD-10-CM

## 2022-12-18 NOTE — Telephone Encounter (Signed)
Patient is requesting a prescription for Emgality 120 mg/ml Pen inj sent to [facility].

## 2022-12-21 NOTE — Telephone Encounter (Signed)
Message sent to patient

## 2022-12-22 NOTE — Telephone Encounter (Signed)
Nurse please close after finishing message.

## 2022-12-25 ENCOUNTER — Other Ambulatory Visit: Payer: Self-pay

## 2022-12-25 ENCOUNTER — Ambulatory Visit (HOSPITAL_COMMUNITY): Payer: 59 | Admitting: Certified Registered Nurse Anesthetist

## 2022-12-25 ENCOUNTER — Observation Stay (HOSPITAL_COMMUNITY)
Admission: RE | Admit: 2022-12-25 | Discharge: 2022-12-26 | Disposition: A | Discharge status: Home / Self Care | Payer: 59 | Source: Ambulatory Visit | Attending: General Surgery | Admitting: General Surgery

## 2022-12-25 ENCOUNTER — Ambulatory Visit (HOSPITAL_BASED_OUTPATIENT_CLINIC_OR_DEPARTMENT_OTHER): Payer: 59 | Admitting: Certified Registered Nurse Anesthetist

## 2022-12-25 ENCOUNTER — Encounter (HOSPITAL_COMMUNITY)
Admission: RE | Disposition: A | Discharge status: Home / Self Care | Payer: Self-pay | Source: Ambulatory Visit | Attending: General Surgery

## 2022-12-25 ENCOUNTER — Encounter (HOSPITAL_COMMUNITY): Payer: Self-pay | Admitting: General Surgery

## 2022-12-25 DIAGNOSIS — G4733 Obstructive sleep apnea (adult) (pediatric): Secondary | ICD-10-CM | POA: Diagnosis not present

## 2022-12-25 DIAGNOSIS — I10 Essential (primary) hypertension: Secondary | ICD-10-CM | POA: Diagnosis not present

## 2022-12-25 DIAGNOSIS — Z9884 Bariatric surgery status: Secondary | ICD-10-CM

## 2022-12-25 DIAGNOSIS — F419 Anxiety disorder, unspecified: Secondary | ICD-10-CM | POA: Insufficient documentation

## 2022-12-25 DIAGNOSIS — Z79899 Other long term (current) drug therapy: Secondary | ICD-10-CM | POA: Insufficient documentation

## 2022-12-25 DIAGNOSIS — F32A Depression, unspecified: Secondary | ICD-10-CM | POA: Diagnosis not present

## 2022-12-25 DIAGNOSIS — F418 Other specified anxiety disorders: Secondary | ICD-10-CM

## 2022-12-25 DIAGNOSIS — J45909 Unspecified asthma, uncomplicated: Secondary | ICD-10-CM | POA: Insufficient documentation

## 2022-12-25 DIAGNOSIS — G43909 Migraine, unspecified, not intractable, without status migrainosus: Secondary | ICD-10-CM | POA: Insufficient documentation

## 2022-12-25 DIAGNOSIS — K5909 Other constipation: Secondary | ICD-10-CM | POA: Diagnosis not present

## 2022-12-25 DIAGNOSIS — Z87891 Personal history of nicotine dependence: Secondary | ICD-10-CM | POA: Diagnosis not present

## 2022-12-25 DIAGNOSIS — Z9989 Dependence on other enabling machines and devices: Secondary | ICD-10-CM

## 2022-12-25 DIAGNOSIS — Z6841 Body Mass Index (BMI) 40.0 and over, adult: Secondary | ICD-10-CM

## 2022-12-25 HISTORY — PX: UPPER GI ENDOSCOPY: SHX6162

## 2022-12-25 HISTORY — PX: LAPAROSCOPIC GASTRIC SLEEVE RESECTION: SHX5895

## 2022-12-25 LAB — POCT PREGNANCY, URINE: Preg Test, Ur: NEGATIVE

## 2022-12-25 LAB — GLUCOSE, CAPILLARY: Glucose-Capillary: 102 mg/dL — ABNORMAL HIGH (ref 70–99)

## 2022-12-25 LAB — HEMOGLOBIN AND HEMATOCRIT, BLOOD
HCT: 41.4 % (ref 36.0–46.0)
Hemoglobin: 13.7 g/dL (ref 12.0–15.0)

## 2022-12-25 LAB — ABO/RH: ABO/RH(D): O POS

## 2022-12-25 SURGERY — GASTRECTOMY, SLEEVE, LAPAROSCOPIC
Anesthesia: General | Site: Abdomen

## 2022-12-25 MED ORDER — SIMETHICONE 80 MG PO CHEW
80.0000 mg | CHEWABLE_TABLET | Freq: Four times a day (QID) | ORAL | Status: DC | PRN
  Administered 2022-12-26: 80 mg via ORAL
  Filled 2022-12-25: qty 1

## 2022-12-25 MED ORDER — LACTATED RINGERS IV SOLN: INTRAVENOUS | Status: DC

## 2022-12-25 MED ORDER — ENSURE MAX PROTEIN PO LIQD
2.0000 [oz_av] | ORAL | Status: DC
  Administered 2022-12-26 (×5): 2 [oz_av] via ORAL

## 2022-12-25 MED ORDER — HYDRALAZINE HCL 20 MG/ML IJ SOLN: 10.0000 mg | INTRAMUSCULAR | Status: DC | PRN

## 2022-12-25 MED ORDER — MIDAZOLAM HCL 2 MG/2ML IJ SOLN
INTRAMUSCULAR | Status: AC
  Filled 2022-12-25: qty 2

## 2022-12-25 MED ORDER — DEXAMETHASONE SODIUM PHOSPHATE 10 MG/ML IJ SOLN
INTRAMUSCULAR | Status: DC | PRN
  Administered 2022-12-25: 10 mg via INTRAVENOUS

## 2022-12-25 MED ORDER — DEXAMETHASONE SODIUM PHOSPHATE 4 MG/ML IJ SOLN: 4.0000 mg | INTRAMUSCULAR | Status: DC

## 2022-12-25 MED ORDER — CHLORHEXIDINE GLUCONATE 4 % EX SOLN: Freq: Once | CUTANEOUS | Status: DC

## 2022-12-25 MED ORDER — FENTANYL CITRATE PF 50 MCG/ML IJ SOSY
25.0000 ug | PREFILLED_SYRINGE | INTRAMUSCULAR | Status: DC | PRN
  Administered 2022-12-25 (×2): 50 ug via INTRAVENOUS

## 2022-12-25 MED ORDER — KETAMINE HCL 10 MG/ML IJ SOLN
INTRAMUSCULAR | Status: DC | PRN
  Administered 2022-12-25: 30 mg via INTRAVENOUS

## 2022-12-25 MED ORDER — 0.9 % SODIUM CHLORIDE (POUR BTL) OPTIME
TOPICAL | Status: DC | PRN
  Administered 2022-12-25: 1000 mL

## 2022-12-25 MED ORDER — HEPARIN SODIUM (PORCINE) 5000 UNIT/ML IJ SOLN
5000.0000 [IU] | INTRAMUSCULAR | Status: AC
  Administered 2022-12-25: 5000 [IU] via SUBCUTANEOUS
  Filled 2022-12-25: qty 1

## 2022-12-25 MED ORDER — FENTANYL CITRATE (PF) 100 MCG/2ML IJ SOLN
INTRAMUSCULAR | Status: DC | PRN
  Administered 2022-12-25: 100 ug via INTRAVENOUS

## 2022-12-25 MED ORDER — ONDANSETRON HCL 4 MG/2ML IJ SOLN
INTRAMUSCULAR | Status: DC | PRN
  Administered 2022-12-25: 4 mg via INTRAVENOUS

## 2022-12-25 MED ORDER — BUPIVACAINE LIPOSOME 1.3 % IJ SUSP: 20.0000 mL | Freq: Once | INTRAMUSCULAR | Status: DC

## 2022-12-25 MED ORDER — PROPOFOL 10 MG/ML IV BOLUS
INTRAVENOUS | Status: AC
  Filled 2022-12-25: qty 20

## 2022-12-25 MED ORDER — ROCURONIUM BROMIDE 10 MG/ML (PF) SYRINGE
PREFILLED_SYRINGE | INTRAVENOUS | Status: DC | PRN
  Administered 2022-12-25: 70 mg via INTRAVENOUS

## 2022-12-25 MED ORDER — AMISULPRIDE (ANTIEMETIC) 5 MG/2ML IV SOLN
INTRAVENOUS | Status: AC
  Administered 2022-12-25: 10 mg via INTRAVENOUS
  Filled 2022-12-25: qty 4

## 2022-12-25 MED ORDER — DEXAMETHASONE SODIUM PHOSPHATE 10 MG/ML IJ SOLN
INTRAMUSCULAR | Status: AC
  Filled 2022-12-25: qty 1

## 2022-12-25 MED ORDER — PHENYLEPHRINE 80 MCG/ML (10ML) SYRINGE FOR IV PUSH (FOR BLOOD PRESSURE SUPPORT)
PREFILLED_SYRINGE | INTRAVENOUS | Status: DC | PRN
  Administered 2022-12-25: 160 ug via INTRAVENOUS

## 2022-12-25 MED ORDER — CHLORHEXIDINE GLUCONATE 0.12 % MT SOLN
15.0000 mL | Freq: Once | OROMUCOSAL | Status: AC
  Administered 2022-12-25: 15 mL via OROMUCOSAL

## 2022-12-25 MED ORDER — ACETAMINOPHEN 160 MG/5ML PO SOLN: 1000.0000 mg | Freq: Three times a day (TID) | ORAL | Status: DC

## 2022-12-25 MED ORDER — FENTANYL CITRATE (PF) 100 MCG/2ML IJ SOLN
INTRAMUSCULAR | Status: AC
  Filled 2022-12-25: qty 2

## 2022-12-25 MED ORDER — FENTANYL CITRATE PF 50 MCG/ML IJ SOSY
PREFILLED_SYRINGE | INTRAMUSCULAR | Status: AC
  Administered 2022-12-25: 50 ug via INTRAVENOUS
  Filled 2022-12-25: qty 3

## 2022-12-25 MED ORDER — MORPHINE SULFATE (PF) 2 MG/ML IV SOLN
1.0000 mg | INTRAVENOUS | Status: DC | PRN
  Administered 2022-12-25: 2 mg via INTRAVENOUS
  Filled 2022-12-25: qty 1

## 2022-12-25 MED ORDER — SUGAMMADEX SODIUM 200 MG/2ML IV SOLN
INTRAVENOUS | Status: DC | PRN
  Administered 2022-12-25: 200 mg via INTRAVENOUS

## 2022-12-25 MED ORDER — MONTELUKAST SODIUM 10 MG PO TABS
10.0000 mg | ORAL_TABLET | Freq: Every day | ORAL | Status: DC
  Administered 2022-12-25: 10 mg via ORAL
  Filled 2022-12-25: qty 1

## 2022-12-25 MED ORDER — HYDROMORPHONE HCL 1 MG/ML IJ SOLN
0.5000 mg | INTRAMUSCULAR | Status: DC | PRN
  Administered 2022-12-25: 0.5 mg via INTRAVENOUS

## 2022-12-25 MED ORDER — SODIUM CHLORIDE 0.9 % IV SOLN: 12.5000 mg | Freq: Four times a day (QID) | INTRAVENOUS | Status: DC | PRN

## 2022-12-25 MED ORDER — LACTATED RINGERS IR SOLN
Status: DC | PRN
  Administered 2022-12-25: 1000 mL

## 2022-12-25 MED ORDER — APREPITANT 40 MG PO CAPS
40.0000 mg | ORAL_CAPSULE | ORAL | Status: AC
  Administered 2022-12-25: 40 mg via ORAL
  Filled 2022-12-25: qty 1

## 2022-12-25 MED ORDER — HYDROMORPHONE HCL 1 MG/ML IJ SOLN
INTRAMUSCULAR | Status: AC
  Filled 2022-12-25: qty 1

## 2022-12-25 MED ORDER — SODIUM CHLORIDE 0.9 % IV SOLN
2.0000 g | INTRAVENOUS | Status: AC
  Administered 2022-12-25: 2 g via INTRAVENOUS
  Filled 2022-12-25: qty 2

## 2022-12-25 MED ORDER — LIDOCAINE 2% (20 MG/ML) 5 ML SYRINGE
INTRAMUSCULAR | Status: DC | PRN
  Administered 2022-12-25: 80 mg via INTRAVENOUS

## 2022-12-25 MED ORDER — BUPIVACAINE-EPINEPHRINE 0.25% -1:200000 IJ SOLN
INTRAMUSCULAR | Status: AC
  Filled 2022-12-25: qty 1

## 2022-12-25 MED ORDER — ONDANSETRON HCL 4 MG/2ML IJ SOLN: 4.0000 mg | Freq: Four times a day (QID) | INTRAMUSCULAR | Status: DC | PRN

## 2022-12-25 MED ORDER — HEPARIN SODIUM (PORCINE) 5000 UNIT/ML IJ SOLN
5000.0000 [IU] | Freq: Three times a day (TID) | INTRAMUSCULAR | Status: DC
  Administered 2022-12-25 – 2022-12-26 (×3): 5000 [IU] via SUBCUTANEOUS
  Filled 2022-12-25 (×3): qty 1

## 2022-12-25 MED ORDER — ALBUTEROL SULFATE (2.5 MG/3ML) 0.083% IN NEBU: 2.5000 mg | INHALATION_SOLUTION | Freq: Four times a day (QID) | RESPIRATORY_TRACT | Status: DC | PRN

## 2022-12-25 MED ORDER — DEXMEDETOMIDINE HCL IN NACL 80 MCG/20ML IV SOLN
INTRAVENOUS | Status: DC | PRN
  Administered 2022-12-25: 8 ug via INTRAVENOUS

## 2022-12-25 MED ORDER — BUPIVACAINE LIPOSOME 1.3 % IJ SUSP
INTRAMUSCULAR | Status: AC
  Filled 2022-12-25: qty 20

## 2022-12-25 MED ORDER — FLUOXETINE HCL 20 MG PO CAPS
20.0000 mg | ORAL_CAPSULE | Freq: Every day | ORAL | Status: DC
  Administered 2022-12-26: 20 mg via ORAL
  Filled 2022-12-25: qty 1

## 2022-12-25 MED ORDER — KETAMINE HCL 50 MG/5ML IJ SOSY
PREFILLED_SYRINGE | INTRAMUSCULAR | Status: AC
  Filled 2022-12-25: qty 5

## 2022-12-25 MED ORDER — ACETAMINOPHEN 500 MG PO TABS
1000.0000 mg | ORAL_TABLET | ORAL | Status: AC
  Administered 2022-12-25: 1000 mg via ORAL
  Filled 2022-12-25: qty 2

## 2022-12-25 MED ORDER — AMISULPRIDE (ANTIEMETIC) 5 MG/2ML IV SOLN: 10.0000 mg | Freq: Once | INTRAVENOUS | Status: AC | PRN

## 2022-12-25 MED ORDER — PANTOPRAZOLE SODIUM 40 MG IV SOLR
40.0000 mg | Freq: Every day | INTRAVENOUS | Status: DC
  Administered 2022-12-25: 40 mg via INTRAVENOUS
  Filled 2022-12-25: qty 10

## 2022-12-25 MED ORDER — CYCLOBENZAPRINE HCL 10 MG PO TABS: 10.0000 mg | ORAL_TABLET | Freq: Three times a day (TID) | ORAL | Status: DC | PRN

## 2022-12-25 MED ORDER — ACETAMINOPHEN 500 MG PO TABS
1000.0000 mg | ORAL_TABLET | Freq: Three times a day (TID) | ORAL | Status: DC
  Administered 2022-12-25 – 2022-12-26 (×3): 1000 mg via ORAL
  Filled 2022-12-25 (×3): qty 2

## 2022-12-25 MED ORDER — KCL IN DEXTROSE-NACL 20-5-0.45 MEQ/L-%-% IV SOLN
INTRAVENOUS | Status: DC
  Filled 2022-12-25 (×2): qty 1000

## 2022-12-25 MED ORDER — OXYCODONE HCL 5 MG/5ML PO SOLN
5.0000 mg | Freq: Four times a day (QID) | ORAL | Status: DC | PRN
  Administered 2022-12-26 (×2): 5 mg via ORAL
  Filled 2022-12-25 (×3): qty 5

## 2022-12-25 MED ORDER — MIDAZOLAM HCL 5 MG/5ML IJ SOLN
INTRAMUSCULAR | Status: DC | PRN
  Administered 2022-12-25: 2 mg via INTRAVENOUS

## 2022-12-25 MED ORDER — ONDANSETRON HCL 4 MG/2ML IJ SOLN
INTRAMUSCULAR | Status: AC
  Filled 2022-12-25: qty 2

## 2022-12-25 MED ORDER — SCOPOLAMINE 1 MG/3DAYS TD PT72
1.0000 | MEDICATED_PATCH | TRANSDERMAL | Status: DC
  Administered 2022-12-25: 1.5 mg via TRANSDERMAL
  Filled 2022-12-25: qty 1

## 2022-12-25 MED ORDER — PROPOFOL 10 MG/ML IV BOLUS
INTRAVENOUS | Status: DC | PRN
  Administered 2022-12-25: 200 mg via INTRAVENOUS

## 2022-12-25 MED ORDER — BUPIVACAINE-EPINEPHRINE (PF) 0.25% -1:200000 IJ SOLN
INTRAMUSCULAR | Status: DC | PRN
  Administered 2022-12-25: 50 mL

## 2022-12-25 SURGICAL SUPPLY — 85 items
ANTIFOG SOL W/FOAM PAD STRL (MISCELLANEOUS) ×2
APL PRP STRL LF DISP 70% ISPRP (MISCELLANEOUS) ×4
APL SRG 32X5 SNPLK LF DISP (MISCELLANEOUS)
APL SWBSTK 6 STRL LF DISP (MISCELLANEOUS)
APPLICATOR COTTON TIP 6 STRL (MISCELLANEOUS) IMPLANT
APPLICATOR COTTON TIP 6IN STRL (MISCELLANEOUS)
APPLIER CLIP ROT 10 11.4 M/L (STAPLE)
APPLIER CLIP ROT 13.4 12 LRG (CLIP)
APR CLP LRG 13.4X12 ROT 20 MLT (CLIP)
APR CLP MED LRG 11.4X10 (STAPLE)
BAG COUNTER SPONGE SURGICOUNT (BAG) IMPLANT
BAG SPNG CNTER NS LX DISP (BAG)
BLADE SURG SZ11 CARB STEEL (BLADE) ×2 IMPLANT
CABLE HIGH FREQUENCY MONO STRZ (ELECTRODE) IMPLANT
CHLORAPREP W/TINT 26 (MISCELLANEOUS) ×4 IMPLANT
CLIP APPLIE ROT 10 11.4 M/L (STAPLE) IMPLANT
CLIP APPLIE ROT 13.4 12 LRG (CLIP) IMPLANT
COVER SURGICAL LIGHT HANDLE (MISCELLANEOUS) ×2 IMPLANT
DEVICE SUT QUICK LOAD TK 5 (SUTURE) IMPLANT
DEVICE SUT TI-KNOT TK 5X26 (SUTURE) IMPLANT
DEVICE SUTURE ENDOST 10MM (ENDOMECHANICALS) IMPLANT
DISSECTOR BLUNT TIP ENDO 5MM (MISCELLANEOUS) IMPLANT
DRAPE UTILITY XL STRL (DRAPES) ×4 IMPLANT
DRSG TEGADERM 2-3/8X2-3/4 SM (GAUZE/BANDAGES/DRESSINGS) ×12 IMPLANT
ELECT L-HOOK LAP 45CM DISP (ELECTROSURGICAL)
ELECT REM PT RETURN 15FT ADLT (MISCELLANEOUS) ×2 IMPLANT
ELECTRODE L-HOOK LAP 45CM DISP (ELECTROSURGICAL) IMPLANT
GAUZE SPONGE 2X2 8PLY STRL LF (GAUZE/BANDAGES/DRESSINGS) IMPLANT
GAUZE SPONGE 4X4 12PLY STRL (GAUZE/BANDAGES/DRESSINGS) IMPLANT
GLOVE BIO SURGEON STRL SZ7.5 (GLOVE) ×2 IMPLANT
GLOVE INDICATOR 8.0 STRL GRN (GLOVE) ×2 IMPLANT
GOWN STRL REUS W/ TWL XL LVL3 (GOWN DISPOSABLE) ×6 IMPLANT
GOWN STRL REUS W/TWL XL LVL3 (GOWN DISPOSABLE) ×6
GRASPER SUT TROCAR 14GX15 (MISCELLANEOUS) ×2 IMPLANT
IRRIG SUCT STRYKERFLOW 2 WTIP (MISCELLANEOUS) ×2
IRRIGATION SUCT STRKRFLW 2 WTP (MISCELLANEOUS) ×2 IMPLANT
KIT BASIN OR (CUSTOM PROCEDURE TRAY) ×2 IMPLANT
KIT TURNOVER KIT A (KITS) IMPLANT
MARKER SKIN DUAL TIP RULER LAB (MISCELLANEOUS) ×2 IMPLANT
MAT PREVALON FULL STRYKER (MISCELLANEOUS) ×2 IMPLANT
NDL SPNL 22GX3.5 QUINCKE BK (NEEDLE) ×2 IMPLANT
NEEDLE SPNL 22GX3.5 QUINCKE BK (NEEDLE) ×2 IMPLANT
PACK UNIVERSAL I (CUSTOM PROCEDURE TRAY) ×2 IMPLANT
PENCIL SMOKE EVACUATOR (MISCELLANEOUS) IMPLANT
RELOAD STAPLE 60 2.6 WHT THN (STAPLE) IMPLANT
RELOAD STAPLE 60 3.6 BLU REG (STAPLE) ×2 IMPLANT
RELOAD STAPLE 60 3.8 GOLD REG (STAPLE) IMPLANT
RELOAD STAPLE 60 4.1 GRN THCK (STAPLE) ×2 IMPLANT
RELOAD STAPLE 60 BLK VRY/THCK (STAPLE) IMPLANT
RELOAD STAPLER 60MM BLK (STAPLE) IMPLANT
RELOAD STAPLER BLUE 60MM (STAPLE) ×8 IMPLANT
RELOAD STAPLER GOLD 60MM (STAPLE) ×2 IMPLANT
RELOAD STAPLER GREEN 60MM (STAPLE) ×2 IMPLANT
RELOAD STAPLER WHITE 60MM (STAPLE) ×2 IMPLANT
SCISSORS LAP 5X45 EPIX DISP (ENDOMECHANICALS) IMPLANT
SEALANT SURGICAL APPL DUAL CAN (MISCELLANEOUS) IMPLANT
SET TUBE SMOKE EVAC HIGH FLOW (TUBING) ×2 IMPLANT
SHEARS HARMONIC ACE PLUS 45CM (MISCELLANEOUS) ×2 IMPLANT
SLEEVE ADV FIXATION 5X100MM (TROCAR) ×4 IMPLANT
SLEEVE GASTRECTOMY 40FR VISIGI (MISCELLANEOUS) ×2 IMPLANT
SOLUTION ANTFG W/FOAM PAD STRL (MISCELLANEOUS) ×2 IMPLANT
SPIKE FLUID TRANSFER (MISCELLANEOUS) ×2 IMPLANT
STAPLE LINE REINFORCEMENT LAP (STAPLE) IMPLANT
STAPLER ECHELON BIOABSB 60 FLE (MISCELLANEOUS) IMPLANT
STAPLER ECHELON LONG 60 440 (INSTRUMENTS) ×2 IMPLANT
STAPLER RELOAD 60MM BLK (STAPLE)
STAPLER RELOAD BLUE 60MM (STAPLE) ×8
STAPLER RELOAD GOLD 60MM (STAPLE) ×2
STAPLER RELOAD GREEN 60MM (STAPLE) ×2
STAPLER RELOAD WHITE 60MM (STAPLE) ×2
STRIP CLOSURE SKIN 1/2X4 (GAUZE/BANDAGES/DRESSINGS) ×2 IMPLANT
SUT MNCRL AB 4-0 PS2 18 (SUTURE) ×2 IMPLANT
SUT SURGIDAC NAB ES-9 0 48 120 (SUTURE) IMPLANT
SUT VICRYL 0 TIES 12 18 (SUTURE) ×2 IMPLANT
SYR 20ML LL LF (SYRINGE) ×2 IMPLANT
SYR 50ML LL SCALE MARK (SYRINGE) ×2 IMPLANT
SYS KII OPTICAL ACCESS 15MM (TROCAR) ×2
SYSTEM KII OPTICAL ACCESS 15MM (TROCAR) ×2 IMPLANT
TOWEL OR 17X26 10 PK STRL BLUE (TOWEL DISPOSABLE) ×2 IMPLANT
TOWEL OR NON WOVEN STRL DISP B (DISPOSABLE) ×2 IMPLANT
TROCAR ADV FIXATION 5X100MM (TROCAR) ×2 IMPLANT
TROCAR XCEL NON-BLD 5MMX100MML (ENDOMECHANICALS) ×2 IMPLANT
TROCAR Z-THREAD OPTICAL 5X100M (TROCAR) IMPLANT
TUBING CONNECTING 10 (TUBING) ×4 IMPLANT
TUBING ENDO SMARTCAP (MISCELLANEOUS) ×2 IMPLANT

## 2022-12-25 NOTE — H&P (Signed)
PROVIDER: Poppi Scantling Sherril Cong, MD  MRN: [id number] DOB: 22-Oct-1985 DATE OF ENCOUNTER: 12/05/2022 Subjective  Chief Complaint: Follow-up (weightloss)   History of Present Illness: Jacqueline Collins is a 37 y.o. female who is seen today for long-term follow-up regarding her severe obesity and related comorbidities.. Initially met her in January to discuss bariatric surgery. Her weight at that time was 341 pounds.  Comorbidities include hypertension, obstructive sleep apnea, prediabetes, migraines, chronic constipation and asthma. She has still been working with obesity medicine  She recently stopped Mounjaro in anticipation of upcoming bariatric surgery. She has completed the bariatric surgery evaluation process and has received nutritional and psychological evaluation.  She did undergo upper endoscopy and lower endoscopy. She did have some cramping afterwards. She does have some cramping associated with constipation. She is on Linzess. She denies any fever, chills, nausea or vomiting. She has some occasional food related GERD.  Has attended her preoperative education class.  Review of Systems: A complete review of systems was obtained from the patient. I have reviewed this information and discussed as appropriate with the patient. See HPI as well for other ROS.  ROS  Medical History: Past Medical History: Diagnosis Date Anxiety Arthritis Asthma, unspecified asthma severity, unspecified whether complicated, unspecified whether persistent (HHS-HCC) GERD (gastroesophageal reflux disease) Hyperlipidemia Hypertension Sleep apnea  Patient Active Problem List Diagnosis Anxiety with depression Essential hypertension Migraine Prediabetes Vitamin D deficiency  History reviewed. No pertinent surgical history.  Allergies Allergen Reactions Other Hives, Rash and Swelling Pt reports Ajovy. Fremanezumab-Vfrm Hives, Rash and Swelling  Current Outpatient Medications on File Prior to  Visit Medication Sig Dispense Refill albuterol 90 mcg/actuation inhaler Inhale into the lungs benzonatate (TESSALON) 100 MG capsule Take 100 mg by mouth 3 (three) times daily as needed for Cough buPROPion (WELLBUTRIN SR) 100 MG SR tablet Take 100 mg by mouth 2 (two) times daily cyclobenzaprine (FLEXERIL) 10 MG tablet Take 10 mg by mouth 3 (three) times daily as needed ergocalciferol, vitamin D2, 1,250 mcg (50,000 unit) capsule 1 po q sun and 1 po q wed FUROsemide (LASIX) 20 MG tablet Take 20 mg by mouth once daily lactulose (ENULOSE) 20 gram/30 mL solution Take 10 g by mouth once daily montelukast (SINGULAIR) 10 mg tablet Take 1 tablet by mouth at bedtime MOUNJARO 5 mg/0.5 mL PnIj Inject 5 mg subcutaneously every 7 (seven) days omeprazole (PRILOSEC) 20 MG DR capsule Take 20 mg by mouth once daily  No current facility-administered medications on file prior to visit.  History reviewed. No pertinent family history.  Social History  Tobacco Use Smoking Status Never Smokeless Tobacco Never   Social History  Socioeconomic History Marital status: Unknown Tobacco Use Smoking status: Never Smokeless tobacco: Never Vaping Use Vaping status: Every Day Substance and Sexual Activity Alcohol use: Yes Drug use: Never  Social Determinants of Health  Financial Resource Strain: High Risk (09/25/2022) Received from [facility], [place] Overall Financial Resource Strain (CARDIA) Difficulty of Paying Living Expenses: Hard Food Insecurity: Food Insecurity Present (09/25/2022) Received from [facility], [place] Hunger Vital Sign Worried About Running Out of Food in the Last Year: Often true Ran Out of Food in the Last Year: Sometimes true Transportation Needs: Patient Declined (09/25/2022) Received from [facility], [place] [facility] - Transportation Lack of Transportation (Medical): Patient declined Lack of Transportation (Non-Medical): Patient declined Physical Activity:  Insufficiently Active (09/25/2022) Received from [facility], [place] Exercise Vital Sign Days of Exercise per Week: 2 days Minutes of Exercise per Session: 40 min  Stress: Stress Concern Present (09/25/2022) Received from [facility], [facility] - Occupational Stress Questionnaire Feeling of Stress : To some extent Social Connections: Unknown (09/25/2022) Received from [facility], [place] Social Connection and Isolation Panel [NHANES] Frequency of Communication with Friends and Family: Once a week Frequency of Social Gatherings with Friends and Family: Once a week Attends Religious Services: Patient declined Database administrator or Organizations: No Marital Status: Patient declined  Objective:  Vitals: 12/05/22 1543 BP: 123/84 Pulse: 74 Weight: (!) 155.1 kg (342 lb) Height: 157.5 cm (5\' 2" )  Body mass index is 62.55 kg/m.  Gen: alert, NAD, non-toxic appearing; severe obesity Pupils: equal, no scleral icterus Pulm: Lungs clear to auscultation, symmetric chest rise CV: regular rate and rhythm Abd: soft, nontender, nondistended. Ext: no edema, Skin: no rash, no jaundice  Labs, Imaging and Diagnostic Testing:  Colonoscopy 11/06/22 - wnl Egd 11/06/22 Z-line was regular and found at 39 cm from the incisors. No stenosis or stricture. Patchy inflammation in the stomach, H. pylori biopsy negative  Upper GI for 2/9 showed small nonobstructing/skis ring otherwise normal Cxr x-ray February 9 nml  Obesity medicine note 09/11/22  Office note from January 2024  Assessment and Plan: Diagnoses and all orders for this visit:  Severe obesity (CMS/HHS-HCC)  Prediabetes  Vitamin D deficiency  Essential hypertension  OSA (obstructive sleep apnea)  Chronic constipation  Migraine without status migrainosus, not intractable, unspecified migraine type  Anxiety with depression    Reviewed her workup. We discussed her upper GI,  upper endoscopy, lower endoscopy. We reviewed her labs. She has attended her preoperative education class. We rediscussed the typical hospitalization. We discussed the typical issues that we see after surgery. She reviewed the surgical consent form and signed. We discussed the importance of the preoperative meal plan. We went over it as well. All of her questions were asked and answered.  This patient encounter took 22 minutes today to perform the following: take history, perform exam, review outside records, interpret imaging, counsel the patient on their diagnosis and document encounter, findings & plan in the EHR  No follow-ups on file.  Mary Sella. Andrey Campanile MD FACS General, Minimally Invasive, & Bariatric Surgery Electronically signed by Gara Kroner, MD at 12/05/2022 4:23 PM EDT

## 2022-12-25 NOTE — Interval H&P Note (Signed)
History and Physical Interval Note:  12/25/2022 11:03 AM  Jacqueline Collins  has presented today for surgery, with the diagnosis of OBESITY.  The various methods of treatment have been discussed with the patient and family. After consideration of risks, benefits and other options for treatment, the patient has consented to  Procedure(s): LAPAROSCOPIC SLEEVE GASTRECTOMY (N/A) UPPER GI ENDOSCOPY (N/A) as a surgical intervention.  The patient's history has been reviewed, patient examined, no change in status, stable for surgery.  I have reviewed the patient's chart and labs.  Questions were answered to the patient's satisfaction.    Mary Sella. Andrey Campanile, MD, FACS General, Bariatric, & Minimally Invasive Surgery [facility],  [facility]  Gaynelle Adu

## 2022-12-25 NOTE — Telephone Encounter (Signed)
Please close

## 2022-12-25 NOTE — Progress Notes (Signed)
   12/25/22 2006  BiPAP/CPAP/SIPAP  Reason BIPAP/CPAP not in use Non-compliant   Asked PT about cpap, states she is suppose to use one at home but does not, and does no want to use one while here.

## 2022-12-25 NOTE — Op Note (Signed)
12/25/2022 [name] 1985-09-27 [id number]   PRE-OPERATIVE DIAGNOSIS:   Severe obesity (BMI 61))  Prediabetes  Vitamin D deficiency  Essential hypertension  OSA (obstructive sleep apnea)  Chronic constipation  Migraine without status migrainosus, not intractable, unspecified migraine type  Anxiety with depression   POST-OPERATIVE DIAGNOSIS:  same  PROCEDURE:  Procedure(s): LAPAROSCOPIC SLEEVE GASTRECTOMY  UPPER GI ENDOSCOPY Laparoscopic bilateral TAP block  SURGEON:  Surgeon(s): Atilano Ina, MD FACS FASMBS  ASSISTANTS: Phylliss Blakes MD FACS  ANESTHESIA:   general  DRAINS: none   BOUGIE: 40 fr ViSiGi  LOCAL MEDICATIONS USED:   Exparel  EBL: minimal  SPECIMEN:  Source of Specimen:  Greater curvature of stomach  DISPOSITION OF SPECIMEN:  PATHOLOGY  COUNTS:  YES  INDICATION FOR PROCEDURE: This is a very pleasant 37 y.o.-year-old morbidly obese female who has had unsuccessful attempts for sustained weight loss. The patient presents today for a planned laparoscopic sleeve gastrectomy with upper endoscopy. We have discussed the risk and benefits of the procedure extensively preoperatively. Please see my separate notes.  PROCEDURE: After obtaining informed consent and receiving 5000 units of subcutaneous heparin, the patient was brought to the operating room at [facility] and placed supine on the operating room table. General endotracheal anesthesia was established. Sequential compression devices were placed. A orogastric tube was placed. The patient's abdomen was prepped and draped in the usual standard surgical fashion. The patient received preoperative IV antibiotic. A surgical timeout was performed. ERAS protocol used.   Access to the abdomen was achieved using a 5 mm 0 laparoscope thru a 5 mm trocar In the left upper Quadrant 2 fingerbreadths below the left subcostal margin using the Optiview technique. Pneumoperitoneum was smoothly established up to  15 mm of mercury. The laparoscope was advanced and the abdominal cavity was surveilled. The patient was then placed in reverse Trendelenburg.   A 5 mm trocar was placed slightly above and to the left of the umbilicus under direct visualization.  The [facility] liver retractor was placed under the left lobe of the liver through a 5 mm trocar incision site in the subxiphoid position. A 5 mm trocar was placed in the lateral right upper quadrant along with a 15 mm trocar in the mid right abdomen. A final 5 mm trocar was placed in the lateral LUQ.  All under direct visualization after exparel had been infiltrated in bilateral lateral upper abdominal walls as a TAP block for postoperative pain relief.  The stomach was inspected. It was completely decompressed and the orogastric tube was removed.  There was no small anterior dimple that was obviously visible. Preop upper gi showed no sliding hiatal hernia.   We identified the pylorus and measured 6 cm proximal to the pylorus and identified an area of where we would start taking down the short gastric vessels. Harmonic scalpel was used to take down the short gastric vessels along the greater curvature of the stomach. We were able to enter the lesser sac. We continued to march along the greater curvature of the stomach taking down the short gastrics. As we approached the gastrosplenic ligament we took care in this area not to injure the spleen. We were able to take down the entire gastrosplenic ligament. We then mobilized the fundus away from the left crus of diaphragm. There were not any significant posterior gastric avascular attachments. This left the stomach completely mobilized. No vessels had been taken down along the lesser curvature of the stomach.  We then reidentified  the pylorus. A 40Fr ViSiGi was then placed in the oropharynx and advanced down into the stomach and placed in the distal antrum and positioned along the lesser curvature. It was placed under  suction which secured the 40Fr ViSiGi in place along the lesser curve. Then using the Ethicon echelon 60 mm stapler with a gold load with ethicon staple line reinforcement (ESLR), I placed a stapler along the antrum approximately 5 cm from the pylorus. The stapler was angled so that there is ample room at the angularis incisura. I then fired the first staple load after inspecting it posteriorly to ensure adequate space both anteriorly and posteriorly. At this point I still was not completely past the angularis so with a blue load with ESLR, I placed the stapler in position just inside the prior stapleline. We then rotated the stomach to insure that there was adequate anteriorly as well as posteriorly. The stapler was then fired.  At this point I continued using 60 mm blue load staple cartridges with ESLR. The echelon stapler was then repositioned with a 60 mm blue load with ESLR and we continued to march up along the ViSiGi. My assistant was holding traction along the greater curvature stomach along the cauterized short gastric vessels ensuring that the stomach was symmetrically retracted. Prior to each firing of the staple, we rotated the stomach to ensure that there is adequate stomach left.  As we approached the fundus, I used 60 mm blue cartridge with ESLR aiming  lateral to the GE junction after mobilizing some of the esophageal fat pad. There was about 1-2 mm of intact stomach left where the staples weren't divided so I used a final 60mm white load.  The sleeve was inspected. There is no evidence of cork screw. The staple line appeared hemostatic. The CRNA inflated the ViSiGi to the green zone and the upper abdomen was flooded with saline. There were no bubbles. The sleeve was decompressed and the ViSiGi removed.  Pneumoperitoneum was reduced to 8 mmHg so we can inspect for bleeding along the staple line.  There was 1 small spot at the beginning of the 3rd staple line where I placed two clips. My assistant  scrubbed out and performed an upper endoscopy. The sleeve easily distended with air and the scope was easily advanced to the pylorus. There is no evidence of internal bleeding or cork screwing. There was no narrowing at the angularis. There is no evidence of bubbles. Please see her operative note for further details. The gastric sleeve was decompressed and the endoscope was removed.  The greater curvature the stomach was grasped with a laparoscopic grasper and removed from the 15 mm trocar site.  The liver retractor was removed. I then closed the 15 mm trocar site with 1 interrupted 0 Vicryl sutures through the fascia using the endoclose. The closure was viewed laparoscopically and it was airtight. Remaining Exparel was then infiltrated in the preperitoneal spaces around the trocar sites. Pneumoperitoneum was released. All trocar sites were closed with a 4-0 Monocryl in a subcuticular fashion followed by the application of steri-strips, and bandaids. The patient was extubated and taken to the recovery room in stable condition. All needle, instrument, and sponge counts were correct x2. There are no immediate complications  (0) 60 mm green with ESLR (1) 60 mm gold with ESLR (4) 60 mm blue with ESLR (1) 60mm white load  PLAN OF CARE: Admit to inpatient   PATIENT DISPOSITION:  PACU - hemodynamically stable.   Delay  start of Pharmacological VTE agent (>24hrs) due to surgical blood loss or risk of bleeding:  no  Mary Sella. Andrey Campanile, MD, FACS FASMBS General, Bariatric, & Minimally Invasive Surgery [facility], [place]
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
## 2022-12-25 NOTE — Op Note (Signed)
Preoperative diagnosis: laparoscopic sleeve gastrectomy ° °Postoperative diagnosis: Same  ° °Procedure: Upper endoscopy  ° °Surgeon: Coriann Brouhard A Fatemah Pourciau, M.D. ° °Anesthesia: Gen.  ° °Description of procedure: The endoscope was placed in the mouth and oropharynx and under endoscopic vision it was advanced to the esophagogastric junction which was identified at 39cm from the teeth.  The pouch was tensely insufflated while the upper abdomen was flooded with irrigation to perform a leak test, which was negative. No bubbles were seen.  The staple line was hemostatic and the lumen was evenly tubular without undue narrowing, angulation or twisting specifically at the incisura angularis. The lumen was decompressed and the scope was withdrawn without difficulty.   ° °Teddie Mehta A Lillyrose Reitan, M.D. °General, Bariatric, & Minimally Invasive Surgery °[facility] [place] ° ° °

## 2022-12-25 NOTE — Transfer of Care (Signed)
Immediate Anesthesia Transfer of Care Note  Patient: Jacqueline Collins  Procedure(s) Performed: Procedure(s): LAPAROSCOPIC SLEEVE GASTRECTOMY (N/A) UPPER GI ENDOSCOPY (N/A)  Patient Location: PACU  Anesthesia Type:General  Level of Consciousness: Alert, Awake, Oriented  Airway & Oxygen Therapy: Patient Spontanous Breathing  Post-op Assessment: Report given to RN  Post vital signs: Reviewed and stable  Last Vitals:  Vitals:   12/25/22 0905 12/25/22 1315  BP: 107/79 (!) 144/96  Pulse: 61 84  Resp: 18 (!) 24  Temp: 37.2 C 36.4 C  SpO2: 100% 100%    Complications: No apparent anesthesia complications

## 2022-12-25 NOTE — Anesthesia Postprocedure Evaluation (Signed)
Anesthesia Post Note  Patient: Jacqueline Collins  Procedure(s) Performed: LAPAROSCOPIC SLEEVE GASTRECTOMY (Abdomen) UPPER GI ENDOSCOPY     Patient location during evaluation: PACU Anesthesia Type: General Level of consciousness: awake and alert Pain management: pain level controlled Vital Signs Assessment: post-procedure vital signs reviewed and stable Respiratory status: spontaneous breathing, nonlabored ventilation, respiratory function stable and patient connected to nasal cannula oxygen Cardiovascular status: blood pressure returned to baseline and stable Postop Assessment: no apparent nausea or vomiting Anesthetic complications: no  No notable events documented.  Last Vitals:  Vitals:   12/25/22 1330 12/25/22 1345  BP: (!) 139/98 (!) 135/91  Pulse: 79 68  Resp: 13 16  Temp:  36.7 C  SpO2: 100% 100%    Last Pain:  Vitals:   12/25/22 1345  TempSrc:   PainSc: 6                  Kennieth Rad

## 2022-12-25 NOTE — Discharge Instructions (Signed)

## 2022-12-25 NOTE — Progress Notes (Signed)
Discussed QI "Goals for Discharge" document with patient including ambulation in halls, Incentive Spirometry use every hour, and oral care.  Also discussed pain and nausea control.  Enabled or verified head of bed 30 degree alarm activated.  BSTOP education provided including BSTOP information guide, "Guide for Pain Management after your Bariatric Procedure".  Diet progression education provided including "Bariatric Surgery Post-Op Food Plan Phase 1: Liquids".  Questions answered.  Will continue to partner with bedside RN and follow up with patient per protocol.    Thank you,  Jori Frerichs Jrake Rodriquez, RN, MSN Bariatric Nurse Coordinator [phone] (office)  

## 2022-12-25 NOTE — Anesthesia Procedure Notes (Signed)
Procedure Name: Intubation Date/Time: 12/25/2022 11:45 AM  Performed by: Basilio Cairo, CRNAPre-anesthesia Checklist: Patient identified, Patient being monitored, Timeout performed, Emergency Drugs available and Suction available Patient Re-evaluated:Patient Re-evaluated prior to induction Oxygen Delivery Method: Circle system utilized Preoxygenation: Pre-oxygenation with 100% oxygen Induction Type: IV induction Ventilation: Mask ventilation without difficulty Laryngoscope Size: Mac and 3 Grade View: Grade I Tube type: Oral Tube size: 7.5 mm Number of attempts: 1 Airway Equipment and Method: Stylet Placement Confirmation: ETT inserted through vocal cords under direct vision, positive ETCO2 and breath sounds checked- equal and bilateral Secured at: 21 cm Tube secured with: Tape Dental Injury: Teeth and Oropharynx as per pre-operative assessment

## 2022-12-25 NOTE — Anesthesia Preprocedure Evaluation (Signed)
Anesthesia Evaluation  Patient identified by MRN, date of birth, ID band Patient awake    Reviewed: Allergy & Precautions, NPO status , Patient's Chart, lab work & pertinent test results  Airway Mallampati: II  TM Distance: >3 FB Neck ROM: Full    Dental   Pulmonary asthma , sleep apnea and Continuous Positive Airway Pressure Ventilation , Patient abstained from smoking., former smoker   Pulmonary exam normal        Cardiovascular hypertension, Pt. on medications  Rhythm:Regular Rate:Normal     Neuro/Psych  Headaches    GI/Hepatic Neg liver ROS,GERD  ,,  Endo/Other    Morbid obesity  Renal/GU negative Renal ROS     Musculoskeletal   Abdominal   Peds  Hematology negative hematology ROS (+)   Anesthesia Other Findings   Reproductive/Obstetrics                             Anesthesia Physical Anesthesia Plan  ASA: 3  Anesthesia Plan: General   Post-op Pain Management: Tylenol PO (pre-op)* and Ketamine IV*   Induction: Intravenous  PONV Risk Score and Plan: 4 or greater and Dexamethasone, Ondansetron, Midazolam and Scopolamine patch - Pre-op  Airway Management Planned: Oral ETT  Additional Equipment:   Intra-op Plan:   Post-operative Plan: Extubation in OR  Informed Consent: I have reviewed the patients History and Physical, chart, labs and discussed the procedure including the risks, benefits and alternatives for the proposed anesthesia with the patient or authorized representative who has indicated his/her understanding and acceptance.     Dental advisory given  Plan Discussed with: CRNA  Anesthesia Plan Comments:        Anesthesia Quick Evaluation

## 2022-12-25 NOTE — Progress Notes (Signed)
Pacu RN Report to floor given  Gave report to [employer]. Room: 1304    Discussed surgery, meds given in OR and Pacu, VS, IV fluids given, EBL, urine output, pain and other pertinent information. Also discussed if pt had any family or friends here or belongings with them.   Pt is on CPM, tolerating well. She is moving her legs, no pain.  Verified with Anesthesia for low HR of upper 39 to low 40s, Bp was 126/76, now 165/80s, she did have Clonidine in OR, Dr Glade Stanford and Dr Lequita Halt are ok with pt going to the floor.     Pt exits my care.

## 2022-12-26 ENCOUNTER — Encounter (HOSPITAL_COMMUNITY): Payer: Self-pay | Admitting: General Surgery

## 2022-12-26 LAB — COMPREHENSIVE METABOLIC PANEL
ALT: 16 U/L (ref 0–44)
AST: 22 U/L (ref 15–41)
Albumin: 3.9 g/dL (ref 3.5–5.0)
Alkaline Phosphatase: 65 U/L (ref 38–126)
Anion gap: 10 (ref 5–15)
BUN: 8 mg/dL (ref 6–20)
CO2: 21 mmol/L — ABNORMAL LOW (ref 22–32)
Calcium: 9 mg/dL (ref 8.9–10.3)
Chloride: 105 mmol/L (ref 98–111)
Creatinine, Ser: 0.8 mg/dL (ref 0.44–1.00)
GFR, Estimated: >60 mL/min (ref >60)
Glucose, Bld: 190 mg/dL — ABNORMAL HIGH (ref 70–99)
Potassium: 4.4 mmol/L (ref 3.5–5.1)
Sodium: 136 mmol/L (ref 135–145)
Total Bilirubin: 0.7 mg/dL (ref 0.3–1.2)
Total Protein: 7.5 g/dL (ref 6.5–8.1)

## 2022-12-26 LAB — CBC WITH DIFFERENTIAL/PLATELET
Abs Immature Granulocytes: 0.04 10*3/uL (ref 0.00–0.07)
Basophils Absolute: 0 10*3/uL (ref 0.0–0.1)
Basophils Relative: 0 %
Eosinophils Absolute: 0 10*3/uL (ref 0.0–0.5)
Eosinophils Relative: 0 %
HCT: 39 % (ref 36.0–46.0)
Hemoglobin: 12.8 g/dL (ref 12.0–15.0)
Immature Granulocytes: 0 %
Lymphocytes Relative: 10 %
Lymphs Abs: 1.2 10*3/uL (ref 0.7–4.0)
MCH: 30.9 pg (ref 26.0–34.0)
MCHC: 32.8 g/dL (ref 30.0–36.0)
MCV: 94.2 fL (ref 80.0–100.0)
Monocytes Absolute: 0.5 10*3/uL (ref 0.1–1.0)
Monocytes Relative: 4 %
Neutro Abs: 10.3 10*3/uL — ABNORMAL HIGH (ref 1.7–7.7)
Neutrophils Relative %: 86 %
Platelets: 381 10*3/uL (ref 150–400)
RBC: 4.14 MIL/uL (ref 3.87–5.11)
RDW: 13.3 % (ref 11.5–15.5)
WBC: 12 10*3/uL — ABNORMAL HIGH (ref 4.0–10.5)
nRBC: 0 % (ref 0.0–0.2)

## 2022-12-26 LAB — SURGICAL PATHOLOGY

## 2022-12-26 MED ORDER — ACETAMINOPHEN 500 MG PO TABS
1000.0000 mg | ORAL_TABLET | Freq: Three times a day (TID) | ORAL | 0 refills | Status: AC
Start: 2022-12-26 — End: 2022-12-31

## 2022-12-26 MED ORDER — OXYCODONE HCL 5 MG PO TABS
5.0000 mg | ORAL_TABLET | Freq: Four times a day (QID) | ORAL | 0 refills | Status: DC | PRN
Start: 2022-12-26 — End: 2023-01-03

## 2022-12-26 MED ORDER — ONDANSETRON 4 MG PO TBDP
4.0000 mg | ORAL_TABLET | Freq: Four times a day (QID) | ORAL | 0 refills | Status: AC | PRN
Start: 2022-12-26 — End: ?

## 2022-12-26 NOTE — Progress Notes (Signed)
Reviewed written d/c instructions with pt and all questions answered. Pt verbalized understanding. Pt left in stable condition with all belongings.  

## 2022-12-26 NOTE — Progress Notes (Signed)
Patient alert and oriented, pain is controlled. Patient is tolerating fluids, advanced to protein shake today, patient is tolerating well. Reviewed Gastric sleeve/bypass discharge instructions with patient and patient is able to articulate understanding. Provided information on BELT program, Support Group, BSTOP-D, and [facility] outpatient pharmacy. Communicated general update of patient status to surgeon. All questions answered. 24hr fluid recall is 420 mL Per hydration protocol, bariatric nurse coordinator to make follow-up phone call within one week.    Thank you,  Lubertha Basque, RN, MSN Bariatric Nurse Coordinator [phone] (office)

## 2022-12-27 ENCOUNTER — Telehealth: Payer: Self-pay

## 2022-12-27 NOTE — Transitions of Care (Post Inpatient/ED Visit) (Unsigned)
   12/27/2022  Name: Jacqueline Collins MRN: [id number] DOB: 01/22/1986  Today's TOC FU Call Status: Today's TOC FU Call Status:: Unsuccessul Call (1st Attempt) Unsuccessful Call (1st Attempt) Date: 12/27/22  Attempted to reach the patient regarding the most recent Inpatient/ED visit.  Follow Up Plan: Additional outreach attempts will be made to reach the patient to complete the Transitions of Care (Post Inpatient/ED visit) call.   Signature Karena Addison, LPN [facility] Nurse Health Advisor Direct Dial [phone]

## 2022-12-27 NOTE — Discharge Summary (Signed)
Physician Discharge Summary  Jacqueline Collins ACZ:[id number] DOB: Mar 11, 1986 DOA: 12/25/2022  PCP: Tommie Sams, DO  Admit date: 12/25/2022 Discharge date: 12/26/2022  Recommendations for Outpatient Follow-up:    Follow-up Information     Gaynelle Adu, MD Follow up on 01/17/2023.   Specialty: General Surgery Why: Please arrive 15 minutes prior to your appointment at 10:15am Contact information: [address] [place] [address] [phone]         Reine Just, [place] Follow up on 02/22/2023.   Specialty: General Surgery Why: Please arrive 15 minutes prior to your appointment at 9am with Herbert Pun, on behalf of Dr. Lanier Prude information: [address] [facility] [place] [address] [phone]                Discharge Diagnoses:  Principal Problem:   S/P laparoscopic sleeve gastrectomy Severe obesity (BMI 61))  Prediabetes  Vitamin D deficiency  Essential hypertension  OSA (obstructive sleep apnea)  Chronic constipation  Migraine without status migrainosus, not intractable, unspecified migraine type  Anxiety with depression   Surgical Procedure: Laparoscopic Sleeve Gastrectomy, upper endoscopy  Discharge Condition: Good Disposition: Home  Diet recommendation: Postoperative sleeve gastrectomy diet (liquids only)  Filed Weights   12/25/22 0905 12/25/22 0956  Weight: (!) 150.3 kg (!) 150.3 kg     Hospital Course:  The patient was admitted for a planned laparoscopic sleeve gastrectomy. Please see operative note. Preoperatively the patient was given 5000 units of subcutaneous heparin for DVT prophylaxis. Postoperative prophylactic heparin dosing was started on the evening of postoperative day 0. ERAS protocol was used. On the evening of postoperative day 0, the patient was started on water and ice chips. On postoperative day 1 the patient had no fever or tachycardia and was tolerating water in  their diet was gradually advanced throughout the day. The patient was ambulating without difficulty. Their vital signs are stable without fever or tachycardia. Their hemoglobin had remained stable. The patient was maintained on their home settings for CPAP therapy. The patient had received discharge instructions and counseling. They were deemed stable for discharge and had met discharge criteria  Gen: alert, NAD, non-toxic appearing Pupils: equal, no scleral icterus Pulm: Lungs clear to auscultation, symmetric chest rise CV: regular rate and rhythm Abd: soft, mild approp tender, nondistended. . No cellulitis. No incisional hernia Ext: no edema, no calf tenderness Skin: no rash, no jaundice  Discharge Instructions  Discharge Instructions     Ambulate hourly while awake   Complete by: As directed    Call MD for:  difficulty breathing, headache or visual disturbances   Complete by: As directed    Call MD for:  persistant dizziness or light-headedness   Complete by: As directed    Call MD for:  persistant nausea and vomiting   Complete by: As directed    Call MD for:  redness, tenderness, or signs of infection (pain, swelling, redness, odor or green/yellow discharge around incision site)   Complete by: As directed    Call MD for:  severe uncontrolled pain   Complete by: As directed    Call MD for:  temperature >101 F   Complete by: As directed    Diet bariatric full liquid   Complete by: As directed    Discharge instructions   Complete by: As directed    See bariatric discharge instructions   Incentive spirometry   Complete by: As directed    Perform hourly  while awake      Allergies as of 12/26/2022       Reactions   Ajovy [fremanezumab-vfrm] Hives, Swelling, Rash        Medication List     STOP taking these medications    tirzepatide 5 MG/0.5ML Pen Commonly known as: MOUNJARO       TAKE these medications    acetaminophen 500 MG tablet Commonly known as:  TYLENOL Take 2 tablets (1,000 mg total) by mouth every 8 (eight) hours for 5 days.   albuterol 108 (90 Base) MCG/ACT inhaler Commonly known as: VENTOLIN HFA INHALE 1 TO 2 PUFFS BY MOUTH EVERY 6 HOURS AS NEEDED FOR WHEEZING FOR SHORTNESS OF BREATH What changed: See the new instructions.   carboxymethylcellulose 0.5 % Soln Commonly known as: REFRESH PLUS Place 1 drop into both eyes 3 (three) times daily as needed (dry eyes).   cyclobenzaprine 10 MG tablet Commonly known as: FLEXERIL Take 10 mg by mouth 3 (three) times daily as needed for muscle spasms.   Emgality 120 MG/ML Soaj Generic drug: Galcanezumab-gnlm Inject 120 mg into the skin every 30 (thirty) days.   FLUoxetine 20 MG tablet Commonly known as: PROZAC Take 1 tablet (20 mg total) by mouth daily.   fluticasone 50 MCG/ACT nasal spray Commonly known as: FLONASE Place 2 sprays into both nostrils daily. What changed:  when to take this reasons to take this   furosemide 20 MG tablet Commonly known as: LASIX Take 1 tablet (20 mg total) by mouth daily. Notes to patient: Monitor Blood Pressure Daily and keep a log for primary care physician.  Monitor for symptoms of dehydration.  Your physician may need to make changes to your medications with rapid weight loss.      guaiFENesin 600 MG 12 hr tablet Commonly known as: MUCINEX Take 600-1,200 mg by mouth 2 (two) times daily as needed for to loosen phlegm or cough.   hydrocortisone cream 1 % Apply 1 Application topically daily as needed for itching.   linaclotide 145 MCG Caps capsule Commonly known as: Linzess Take 1 capsule (145 mcg total) by mouth daily before breakfast. What changed: when to take this   meloxicam 7.5 MG tablet Commonly known as: MOBIC TAKE 1 TABLET BY MOUTH ONCE DAILY AS NEEDED FOR PAIN What changed: See the new instructions.   montelukast 10 MG tablet Commonly known as: SINGULAIR TAKE 1 TABLET BY MOUTH AT BEDTIME   NON FORMULARY Pt uses cpap  nightly   ondansetron 4 MG disintegrating tablet Commonly known as: ZOFRAN-ODT Take 1 tablet (4 mg total) by mouth every 6 (six) hours as needed for nausea or vomiting.   oxyCODONE 5 MG immediate release tablet Commonly known as: Oxy IR/ROXICODONE Take 1 tablet (5 mg total) by mouth every 6 (six) hours as needed for severe pain.   pantoprazole 40 MG tablet Commonly known as: Protonix Take 1 tablet (40 mg total) by mouth daily.   potassium chloride SA 20 MEQ tablet Commonly known as: KLOR-CON M Take 1 tablet by mouth once daily   rosuvastatin 10 MG tablet Commonly known as: Crestor Take 1 tablet (10 mg total) by mouth daily.   Ubrelvy 100 MG Tabs Generic drug: Ubrogepant Take 100 mg by mouth daily as needed. Take one tablet at onset of headache, may repeat 1 tablet in 2 hours, no more than 2 tablets in 24 hours   Vitamin D (Ergocalciferol) 1.25 MG (50000 UNIT) Caps capsule Commonly known as: DRISDOL TAKE 1 CAPSULE BY  MOUTH TWICE A WEEK ON SUNDAYS AND WEDNESDAYS   Womens Multivitamin Tabs Take 1 tablet by mouth daily.        Follow-up Information     Gaynelle Adu, MD Follow up on 01/17/2023.   Specialty: General Surgery Why: Please arrive 15 minutes prior to your appointment at 10:15am Contact information: [address] [place] [address] [phone]         Reine Just, [place] Follow up on 02/22/2023.   Specialty: General Surgery Why: Please arrive 15 minutes prior to your appointment at 9am with Herbert Pun, on behalf of Dr. Lanier Prude information: [address] [facility] [place] [address] [phone]                  The results of significant diagnostics from this hospitalization (including imaging, microbiology, ancillary and laboratory) are listed below for reference.    Significant Diagnostic Studies: No results found.  Labs: Basic Metabolic Panel: Recent Labs  Lab  12/26/22 0416  NA 136  K 4.4  CL 105  CO2 21*  GLUCOSE 190*  BUN 8  CREATININE 0.80  CALCIUM 9.0   Liver Function Tests: Recent Labs  Lab 12/26/22 0416  AST 22  ALT 16  ALKPHOS 65  BILITOT 0.7  PROT 7.5  ALBUMIN 3.9    CBC: Recent Labs  Lab 12/25/22 1657 12/26/22 0416  WBC  --  12.0*  NEUTROABS  --  10.3*  HGB 13.7 12.8  HCT 41.4 39.0  MCV  --  94.2  PLT  --  381    CBG: Recent Labs  Lab 12/25/22 1116  GLUCAP 102*    Principal Problem:   S/P laparoscopic sleeve gastrectomy   Time coordinating discharge: 15 min  Signed:  Atilano Ina, MD [facility] [phone] 12/27/2022, 12:30 PM

## 2022-12-28 NOTE — Telephone Encounter (Signed)
Message sent to patient via mychart

## 2022-12-28 NOTE — Transitions of Care (Post Inpatient/ED Visit) (Signed)
12/28/2022  Name: Jacqueline Collins MRN: [id number] DOB: Jun 01, 1986  Today's TOC FU Call Status: Today's TOC FU Call Status:: Successful TOC FU Call Competed Unsuccessful Call (1st Attempt) Date: 12/27/22 [facility] FU Call Complete Date: 12/28/22  Transition Care Management Follow-up Telephone Call Date of Discharge: 12/26/22 Discharge Facility: [facility]) Type of Discharge: Inpatient Admission Primary Inpatient Discharge Diagnosis:: bariatric surgery How have you been since you were released from the hospital?: Better Any questions or concerns?: No  Items Reviewed: Did you receive and understand the discharge instructions provided?: No Medications obtained,verified, and reconciled?: Yes (Medications Reviewed) Any new allergies since your discharge?: No Dietary orders reviewed?: Yes Do you have support at home?: Yes People in Home: significant other  Medications Reviewed Today: Medications Reviewed Today     Reviewed by Karena Addison, LPN (Licensed Practical Nurse) on 12/28/22 at 1510  Med List Status: <None>   Medication Order Taking? Sig Documenting Provider Last Dose Status Informant  acetaminophen (TYLENOL) 500 MG tablet [id number]  Take 2 tablets (1,000 mg total) by mouth every 8 (eight) hours for 5 days. Gaynelle Adu, MD  Active   albuterol (VENTOLIN HFA) 108 (90 Base) MCG/ACT inhaler [id number] No INHALE 1 TO 2 PUFFS BY MOUTH EVERY 6 HOURS AS NEEDED FOR WHEEZING FOR SHORTNESS OF BREATH  Patient taking differently: Inhale 1-2 puffs into the lungs every 6 (six) hours as needed for shortness of breath or wheezing.   Tommie Sams, DO More than a month Active Self, Pharmacy Records  carboxymethylcellulose (REFRESH PLUS) 0.5 % SOLN [id number] No Place 1 drop into both eyes 3 (three) times daily as needed (dry eyes). [provider] Past Week Active Self, Pharmacy Records  cyclobenzaprine (FLEXERIL) 10 MG tablet [id number] No Take 10 mg by mouth 3 (three) times daily as  needed for muscle spasms. [provider] Past Month Active Self, Pharmacy Records  FLUoxetine (PROZAC) 20 MG tablet [id number] No Take 1 tablet (20 mg total) by mouth daily. Helane Rima, DO Past Week Active Self, Pharmacy Records           Med Note Rowe Clack D   Tue Dec 12, 2022  2:53 PM)    fluticasone [facility]) 50 MCG/ACT nasal spray [id number] No Place 2 sprays into both nostrils daily.  Patient taking differently: Place 2 sprays into both nostrils daily as needed for allergies.   Couture, Cortni S, PA-C Past Week Active Self, Pharmacy Records  furosemide (LASIX) 20 MG tablet [id number] No Take 1 tablet (20 mg total) by mouth daily. [name], MD Past Week Active Self, Pharmacy Records           Med Note Shiela Mayer   Tue Dec 12, 2022  2:54 PM)    Galcanezumab-gnlm [facility]) 120 MG/ML SOAJ [id number] No Inject 120 mg into the skin every 30 (thirty) days. Shawnie Dapper, NP 11/03/2022 Active Self, Pharmacy Records           Med Note Randa Evens, [place] D   Tue Dec 12, 2022  2:54 PM)    guaiFENesin (MUCINEX) 600 MG 12 hr tablet [id number] No Take 600-1,200 mg by mouth 2 (two) times daily as needed for to loosen phlegm or cough. [provider] Past Week Active Self, Pharmacy Records  hydrocortisone cream 1 % [id number] No Apply 1 Application topically daily as needed for itching. [provider] Past Month Active Self, Pharmacy Records  linaclotide [facility]) 145 MCG CAPS capsule [id number] No Take 1 capsule (145 mcg total)  by mouth daily before breakfast.  Patient taking differently: Take 145 mcg by mouth every other day.   Tiffany Kocher, PA-C 12/25/2022 0745 Active Self, Pharmacy Records  meloxicam (MOBIC) 7.5 MG tablet [id number] No TAKE 1 TABLET BY MOUTH ONCE DAILY AS NEEDED FOR PAIN  Patient taking differently: Take 7.5 mg by mouth daily as needed for pain.   Vivi Barrack, DPM 12/11/2022 Active Self, Pharmacy Records           Med Note  Shiela Mayer   Tue Dec 12, 2022  2:52 PM)    montelukast (SINGULAIR) 10 MG tablet [id number] No TAKE 1 TABLET BY MOUTH AT BEDTIME [name], MD 12/11/2022 Active Self, Pharmacy Records  Multiple Vitamins-Minerals (WOMENS MULTIVITAMIN) TABS [id number] No Take 1 tablet by mouth daily. [provider] 12/11/2022 Active Self, Pharmacy Records  NON FORMULARY [id number] No Pt uses cpap nightly [provider] 12/11/2022 Active Self, Pharmacy Records  ondansetron (ZOFRAN-ODT) 4 MG disintegrating tablet [id number]  Take 1 tablet (4 mg total) by mouth every 6 (six) hours as needed for nausea or vomiting. Gaynelle Adu, MD  Active   oxyCODONE (OXY IR/ROXICODONE) 5 MG immediate release tablet [id number]  Take 1 tablet (5 mg total) by mouth every 6 (six) hours as needed for severe pain. Gaynelle Adu, MD  Active   pantoprazole (PROTONIX) 40 MG tablet [id number] No Take 1 tablet (40 mg total) by mouth daily. Lanelle Bal, DO 12/25/2022 0745 Active Self, Pharmacy Records  potassium chloride SA (KLOR-CON M) 20 MEQ tablet [id number] No Take 1 tablet by mouth once daily [name], MD 12/11/2022 Active Self, Pharmacy Records           Med Note Randa Evens, [place] D   Tue Dec 12, 2022  2:55 PM)    rosuvastatin (CRESTOR) 10 MG tablet [id number] No Take 1 tablet (10 mg total) by mouth daily. [name], MD 12/25/2022 0745 Active Self, Pharmacy Records           Med Note Randa Evens, [facility] D   Tue Dec 12, 2022  2:53 PM)    Ubrogepant (UBRELVY) 100 MG TABS [id number] No Take 100 mg by mouth daily as needed. Take one tablet at onset of headache, may repeat 1 tablet in 2 hours, no more than 2 tablets in 24 hours Lomax, Amy, NP 11/03/2022 Active Self, Pharmacy Records  Vitamin D, Ergocalciferol, (DRISDOL) 1.25 MG (50000 UNIT) CAPS capsule [id number] No TAKE 1 CAPSULE BY MOUTH TWICE A WEEK ON SUNDAYS AND WEDNESDAYS [place], Thermon Leyland, FNP Past Week Active Self, Pharmacy Records            Home  Care and Equipment/Supplies: Were Home Health Services Ordered?: NA Any new equipment or medical supplies ordered?: NA  Functional Questionnaire: Do you need assistance with bathing/showering or dressing?: No Do you need assistance with meal preparation?: No Do you need assistance with eating?: No Do you have difficulty maintaining continence: No Do you need assistance with getting out of bed/getting out of a chair/moving?: No Do you have difficulty managing or taking your medications?: No  Follow up appointments reviewed: PCP Follow-up appointment confirmed?: NA Specialist Hospital Follow-up appointment confirmed?: Yes Date of Specialist follow-up appointment?: 01/09/23 Do you need transportation to your follow-up appointment?: No Do you understand care options if your condition(s) worsen?: Yes-patient verbalized understanding    SIGNATURE Karena Addison, LPN [facility] Nurse Health Advisor Direct Dial [phone]

## 2023-01-02 ENCOUNTER — Other Ambulatory Visit (INDEPENDENT_AMBULATORY_CARE_PROVIDER_SITE_OTHER): Payer: Self-pay | Admitting: Family Medicine

## 2023-01-02 ENCOUNTER — Telehealth (HOSPITAL_COMMUNITY): Payer: Self-pay | Admitting: *Deleted

## 2023-01-02 ENCOUNTER — Ambulatory Visit (INDEPENDENT_AMBULATORY_CARE_PROVIDER_SITE_OTHER): Payer: 59 | Admitting: Family Medicine

## 2023-01-02 DIAGNOSIS — R300 Dysuria: Secondary | ICD-10-CM

## 2023-01-02 DIAGNOSIS — R319 Hematuria, unspecified: Secondary | ICD-10-CM

## 2023-01-02 DIAGNOSIS — G43909 Migraine, unspecified, not intractable, without status migrainosus: Secondary | ICD-10-CM | POA: Diagnosis not present

## 2023-01-02 DIAGNOSIS — J4530 Mild persistent asthma, uncomplicated: Secondary | ICD-10-CM

## 2023-01-02 DIAGNOSIS — E559 Vitamin D deficiency, unspecified: Secondary | ICD-10-CM

## 2023-01-02 DIAGNOSIS — I10 Essential (primary) hypertension: Secondary | ICD-10-CM

## 2023-01-02 LAB — POCT URINALYSIS DIP (CLINITEK)
Bilirubin, UA: NEGATIVE
Glucose, UA: NEGATIVE mg/dL
Leukocytes, UA: NEGATIVE
Nitrite, UA: NEGATIVE
POC PROTEIN,UA: 30 — AB
Spec Grav, UA: >=1.03 — AB (ref 1.010–1.025)
Urobilinogen, UA: 0.2 E.U./dL
pH, UA: 5 (ref 5.0–8.0)

## 2023-01-02 MED ORDER — NURTEC 75 MG PO TBDP: 75.0000 mg | ORAL_TABLET | ORAL | 3 refills | Status: AC

## 2023-01-02 MED ORDER — FLUTICASONE FUROATE-VILANTEROL 100-25 MCG/ACT IN AEPB: 1.0000 | INHALATION_SPRAY | Freq: Every day | RESPIRATORY_TRACT | 5 refills | Status: DC

## 2023-01-02 NOTE — Telephone Encounter (Signed)
1. Tell me about your pain and pain management?     Pt reported pain at the top left incision and lower mid abdominal area. Pt shared it is getting better and has not been intolerable.    2. Let's talk about fluid intake. How much total fluid are you taking in?    Pt states that s/he is working to meet goal of 64 oz of fluid, but has had a time to do so. Pt encouraged to continue to work towards meeting goal. Pt instructed to assess status and suggestions daily utilizing Hydration Action Plan on discharge folder and to call [facility] if in the "red zone".    3. How much protein have you taken in the last day?   Pt states that she is working to meet the goal of 60g of protein today, pt did not share the amount in grams that is being consumed, but was clear that she had not met the goals yet. Pt plans to drink the reminder of goal throughout the day to aim to meet criteria   4. Have you had nausea? Tell me about when you have experienced nausea and what you did to help?   Pt denies nausea.   5. Has the frequency or color changed with your urine?   Pt states that s/he is urinating and noted that for several days she has had blood in the urine. Pt stated that she went to her PCP today who did a UA to run labs. Pt awaiting results.  Encouraged pt to contact [facility] to share this occurrence and any findings. Also reminded pt to call [facility] if she is not able to meet the fluid goal requirements as highlighted on the hydration action plan - pt receptive.   6. Tell me what your incisions look like?   "Incisions look fine". Pt denies a fever, chills. Pt states incisions are not swollen, open, or draining. Pt encouraged to call [facility] if incisions change.    7. Have you been passing gas? BM?   Pt states that they have had a BM. Pt stated their PCP "took them of linzess" but they still have some at home  Pt instructed to take either Miralax or MoM as instructed per "Gastric Bypass/Sleeve Discharge Home Care  Instructions". Pt to call surgeon's office if not able to have BM with medication.     8. If a problem or question were to arise who would you call? Do you know contact numbers for [facility], [facility], and [facility]?   Pt knows to call [facility] for surgical, [facility] for nutrition, and [facility] for non-urgent questions or concerns. Pt denies dehydration symptoms. Pt can describe s/sx of dehydration.   9. How has the walking going?   Pt states s/he is walking around and able to be active without difficulty, pt stated she "has been walking w/ her puppy at least 3 times a day"  10. Are you still using your incentive spirometer? If so, how often?   Pt states that s/he is doing the I.S. Pt encouraged to use incentive spirometer,  while awake until s/he sees the surgeon.   11. How are your vitamins and calcium going? How are you taking them?     Pt states that s/he is taking his/her supplements and vitamins without difficulty.   Will notify [facility] office of this encounter

## 2023-01-02 NOTE — Patient Instructions (Addendum)
Medications sent.  Follow up in 3-6 months.  Call with concerns.  Take care  Dr. Adriana Simas

## 2023-01-03 ENCOUNTER — Emergency Department (HOSPITAL_COMMUNITY)
Admission: EM | Admit: 2023-01-03 | Discharge: 2023-01-03 | Disposition: A | Discharge status: Home / Self Care | Payer: 59 | Attending: Emergency Medicine | Admitting: Emergency Medicine

## 2023-01-03 ENCOUNTER — Emergency Department (HOSPITAL_COMMUNITY): Payer: 59

## 2023-01-03 ENCOUNTER — Other Ambulatory Visit: Payer: Self-pay

## 2023-01-03 ENCOUNTER — Encounter (HOSPITAL_COMMUNITY): Payer: Self-pay | Admitting: Emergency Medicine

## 2023-01-03 DIAGNOSIS — R824 Acetonuria: Secondary | ICD-10-CM | POA: Diagnosis not present

## 2023-01-03 DIAGNOSIS — I10 Essential (primary) hypertension: Secondary | ICD-10-CM | POA: Diagnosis not present

## 2023-01-03 DIAGNOSIS — E860 Dehydration: Secondary | ICD-10-CM | POA: Insufficient documentation

## 2023-01-03 DIAGNOSIS — J45909 Unspecified asthma, uncomplicated: Secondary | ICD-10-CM | POA: Insufficient documentation

## 2023-01-03 DIAGNOSIS — R300 Dysuria: Secondary | ICD-10-CM | POA: Insufficient documentation

## 2023-01-03 DIAGNOSIS — R319 Hematuria, unspecified: Secondary | ICD-10-CM | POA: Insufficient documentation

## 2023-01-03 LAB — URINALYSIS, ROUTINE W REFLEX MICROSCOPIC
Bilirubin Urine: NEGATIVE
Glucose, UA: NEGATIVE mg/dL
Ketones, ur: 80 mg/dL — AB
Nitrite: NEGATIVE
Protein, ur: 30 mg/dL — AB
Specific Gravity, Urine: 1.028 (ref 1.005–1.030)
pH: 5 (ref 5.0–8.0)

## 2023-01-03 LAB — COMPREHENSIVE METABOLIC PANEL
ALT: 17 U/L (ref 0–44)
AST: 17 U/L (ref 15–41)
Albumin: 4 g/dL (ref 3.5–5.0)
Alkaline Phosphatase: 64 U/L (ref 38–126)
Anion gap: 12 (ref 5–15)
BUN: 9 mg/dL (ref 6–20)
CO2: 22 mmol/L (ref 22–32)
Calcium: 9.3 mg/dL (ref 8.9–10.3)
Chloride: 101 mmol/L (ref 98–111)
Creatinine, Ser: 0.89 mg/dL (ref 0.44–1.00)
GFR, Estimated: >60 mL/min (ref >60)
Glucose, Bld: 94 mg/dL (ref 70–99)
Potassium: 4 mmol/L (ref 3.5–5.1)
Sodium: 135 mmol/L (ref 135–145)
Total Bilirubin: 0.9 mg/dL (ref 0.3–1.2)
Total Protein: 7.9 g/dL (ref 6.5–8.1)

## 2023-01-03 LAB — CBC
HCT: 40.8 % (ref 36.0–46.0)
Hemoglobin: 13.5 g/dL (ref 12.0–15.0)
MCH: 31.8 pg (ref 26.0–34.0)
MCHC: 33.1 g/dL (ref 30.0–36.0)
MCV: 96.2 fL (ref 80.0–100.0)
Platelets: 327 10*3/uL (ref 150–400)
RBC: 4.24 MIL/uL (ref 3.87–5.11)
RDW: 13.8 % (ref 11.5–15.5)
WBC: 6.7 10*3/uL (ref 4.0–10.5)
nRBC: 0 % (ref 0.0–0.2)

## 2023-01-03 LAB — LIPASE, BLOOD: Lipase: 32 U/L (ref 11–51)

## 2023-01-03 LAB — POC URINE PREG, ED: Preg Test, Ur: NEGATIVE

## 2023-01-03 MED ORDER — SODIUM CHLORIDE 0.9 % IV BOLUS
1000.0000 mL | Freq: Once | INTRAVENOUS | Status: AC
  Administered 2023-01-03: 1000 mL via INTRAVENOUS

## 2023-01-03 MED ORDER — OXYCODONE HCL 5 MG PO TABS: 5.0000 mg | ORAL_TABLET | Freq: Four times a day (QID) | ORAL | 0 refills | Status: DC | PRN

## 2023-01-03 MED ORDER — OXYCODONE HCL 5 MG PO TABS
5.0000 mg | ORAL_TABLET | Freq: Once | ORAL | Status: AC
  Administered 2023-01-03: 5 mg via ORAL
  Filled 2023-01-03: qty 1

## 2023-01-03 MED ORDER — IOHEXOL 300 MG/ML  SOLN
100.0000 mL | Freq: Once | INTRAMUSCULAR | Status: AC | PRN
  Administered 2023-01-03: 100 mL via INTRAVENOUS

## 2023-01-03 MED ORDER — SULFAMETHOXAZOLE-TRIMETHOPRIM 800-160 MG PO TABS
1.0000 | ORAL_TABLET | Freq: Once | ORAL | Status: AC
  Administered 2023-01-03: 1 via ORAL
  Filled 2023-01-03: qty 1

## 2023-01-03 MED ORDER — SULFAMETHOXAZOLE-TRIMETHOPRIM 800-160 MG PO TABS: 1.0000 | ORAL_TABLET | Freq: Two times a day (BID) | ORAL | 0 refills | Status: AC

## 2023-01-03 NOTE — Assessment & Plan Note (Signed)
Placing on Nurtec.  Will see if insurance will cover.

## 2023-01-03 NOTE — Assessment & Plan Note (Signed)
Blood pressure is well-controlled today.  Patient has been on Lasix but this has not been prescribed since 2022.  Discontinuing.

## 2023-01-03 NOTE — Assessment & Plan Note (Signed)
Confirming hematuria with microscopy.  Sending culture.   Referring to urology.

## 2023-01-03 NOTE — ED Provider Notes (Signed)
[place] EMERGENCY DEPARTMENT AT [facility] Provider Note   CSN: [id number] Arrival date & time: 01/03/23  1452     History  Chief Complaint  Patient presents with   Emesis    Jacqueline Collins is a 37 y.o. female with a history include an asthma, GERD, reviewed note he bringing up hypertension who underwent gastric sleeve procedure June 24 presenting for evaluation of suspected dehydration.  She has been trying to keep up with her p.o. fluids, is currently still on liquids since her surgery, she endorses generalized fatigue, and has been able to take small amounts of fluid p.o., approximately 4 ounces at a time, triggering nausea without dehydration.  PCP sent her here for suspected dehydration.  She was advised to receive IV fluids.  She does report pain at her incision sites, denies fevers or chills, also denies vomiting, diarrhea.  She has been urinating less than normal since her surgery.  The history is provided by the patient.       Home Medications Prior to Admission medications   Medication Sig Start Date End Date Taking? Authorizing Provider  sulfamethoxazole-trimethoprim (BACTRIM DS) 800-160 MG tablet Take 1 tablet by mouth 2 (two) times daily for 3 days. 01/03/23 01/06/23 Yes Alveda Vanhorne, Raynelle Fanning, PA-C  albuterol (VENTOLIN HFA) 108 (90 Base) MCG/ACT inhaler INHALE 1 TO 2 PUFFS BY MOUTH EVERY 6 HOURS AS NEEDED FOR WHEEZING FOR SHORTNESS OF BREATH Patient taking differently: Inhale 1-2 puffs into the lungs every 6 (six) hours as needed for shortness of breath or wheezing. 12/06/22   Tommie Sams, DO  carboxymethylcellulose (REFRESH PLUS) 0.5 % SOLN Place 1 drop into both eyes 3 (three) times daily as needed (dry eyes).    [provider]  cyclobenzaprine (FLEXERIL) 10 MG tablet Take 10 mg by mouth 3 (three) times daily as needed for muscle spasms.    [provider]  fluticasone (FLONASE) 50 MCG/ACT nasal spray Place 2 sprays into both nostrils daily. Patient  taking differently: Place 2 sprays into both nostrils daily as needed for allergies. 09/16/21   Couture, Cortni S, PA-C  fluticasone furoate-vilanterol (BREO ELLIPTA) 100-25 MCG/ACT AEPB Inhale 1 puff into the lungs daily. 01/02/23   Tommie Sams, DO  hydrocortisone cream 1 % Apply 1 Application topically daily as needed for itching.    [provider]  linaclotide (LINZESS) 145 MCG CAPS capsule Take 1 capsule (145 mcg total) by mouth daily before breakfast. Patient taking differently: Take 145 mcg by mouth every other day. 08/29/22   Tiffany Kocher, PA-C  Multiple Vitamins-Minerals (WOMENS MULTIVITAMIN) TABS Take 1 tablet by mouth daily.    [provider]  ondansetron (ZOFRAN-ODT) 4 MG disintegrating tablet Take 1 tablet (4 mg total) by mouth every 6 (six) hours as needed for nausea or vomiting. 12/26/22   Gaynelle Adu, MD  oxyCODONE (ROXICODONE) 5 MG immediate release tablet Take 1 tablet (5 mg total) by mouth every 6 (six) hours as needed for severe pain. 01/03/23   Burgess Amor, PA-C  pantoprazole (PROTONIX) 40 MG tablet Take 1 tablet (40 mg total) by mouth daily. 11/06/22 11/06/23  Lanelle Bal, DO  Rimegepant Sulfate (NURTEC) 75 MG TBDP Take 1 tablet (75 mg total) by mouth every other day. 01/02/23   Cook, Verdis Frederickson, DO  Ubrogepant (UBRELVY) 100 MG TABS Take 100 mg by mouth daily as needed. Take one tablet at onset of headache, may repeat 1 tablet in 2 hours, no more than 2 tablets  in 24 hours 08/25/21   Shawnie Dapper, NP      Allergies    Ajovy [[id number]-vfrm]    Review of Systems   Review of Systems  Constitutional:  Positive for fatigue. Negative for fever.  HENT:  Negative for congestion.   Eyes: Negative.   Respiratory:  Negative for chest tightness and shortness of breath.   Cardiovascular:  Negative for chest pain.  Gastrointestinal:  Positive for abdominal pain. Negative for nausea and vomiting.  Genitourinary: Negative.   Musculoskeletal:  Negative for arthralgias,  joint swelling and neck pain.  Skin: Negative.  Negative for rash and wound.  Neurological:  Positive for weakness. Negative for dizziness, light-headedness, numbness and headaches.  Psychiatric/Behavioral: Negative.      Physical Exam Updated Vital Signs BP 110/80 (BP Location: Right Arm)   Pulse 62   Temp 98.2 F (36.8 C) (Oral)   Resp 18   Ht 5\' 2"  (1.575 m)   Wt (!) 147.2 kg   LMP 11/01/2022 Comment: 12-25-22 Ua pregnancy test  Negative  SpO2 99%   BMI 59.35 kg/m  Physical Exam Vitals and nursing note reviewed.  Constitutional:      Appearance: She is well-developed.  HENT:     Head: Normocephalic and atraumatic.  Eyes:     Conjunctiva/sclera: Conjunctivae normal.  Cardiovascular:     Rate and Rhythm: Normal rate and regular rhythm.     Heart sounds: Normal heart sounds.  Pulmonary:     Effort: Pulmonary effort is normal.     Breath sounds: Normal breath sounds. No wheezing.  Abdominal:     General: Bowel sounds are normal. There is distension.     Palpations: Abdomen is soft.     Tenderness: There is abdominal tenderness. There is no guarding or rebound.     Comments: Well-healing laparoscopic incisions on abdominal wall.  Musculoskeletal:        General: Normal range of motion.     Cervical back: Normal range of motion.  Skin:    General: Skin is warm and dry.  Neurological:     Mental Status: She is alert.     ED Results / Procedures / Treatments   Labs (all labs ordered are listed, but only abnormal results are displayed) Labs Reviewed  URINALYSIS, ROUTINE W REFLEX MICROSCOPIC - Abnormal; Notable for the following components:      Result Value   Color, Urine AMBER (*)    APPearance HAZY (*)    Hgb urine dipstick SMALL (*)    Ketones, ur 80 (*)    Protein, ur 30 (*)    Leukocytes,Ua TRACE (*)    Bacteria, UA RARE (*)    All other components within normal limits  URINE CULTURE  LIPASE, BLOOD  COMPREHENSIVE METABOLIC PANEL  CBC  POC URINE PREG,  ED    EKG None  Radiology CT ABDOMEN PELVIS W CONTRAST  Result Date: 01/03/2023 CLINICAL DATA:  Abdominal pain status post gastric sleeve on December 25, 2022 EXAM: CT ABDOMEN AND PELVIS WITH CONTRAST TECHNIQUE: Multidetector CT imaging of the abdomen and pelvis was performed using the standard protocol following bolus administration of intravenous contrast. RADIATION DOSE REDUCTION: This exam was performed according to the departmental dose-optimization program which includes automated exposure control, adjustment of the mA and/or kV according to patient size and/or use of iterative reconstruction technique. CONTRAST:  OMNIPAQUE IOHEXOL 300 MG/ML  SOLN COMPARISON:  CT abdomen and pelvis with contrast February 21, 2019 FINDINGS: Lower chest: The visualized  bibasilar lungs clear. Normal heart size and no pericardial effusion. Hepatobiliary: No focal liver abnormality is seen. No gallstones, gallbladder wall thickening, or biliary dilatation. Pancreas: Unremarkable. No pancreatic ductal dilatation or surrounding inflammatory changes. Spleen: Normal in size without focal abnormality. Adrenals/Urinary Tract: Adrenal glands are unremarkable. Kidneys are normal, without renal calculi, focal lesion, or hydronephrosis. Bladder is unremarkable. Stomach/Bowel: Status post gastric sleeve. No evidence of bowel wall thickening, distention, or inflammatory changes.Appendix appears normal. Vascular/Lymphatic: No significant vascular findings are present. No enlarged abdominal or pelvic lymph nodes. Reproductive: Uterus and bilateral adnexa are unremarkable. Other: Minimal right ventral abdominal wall fat stranding at the level of the umbilicus, likely related to recent surgery. Small volume free fluid in the pelvis, likely physiologic. Musculoskeletal: No acute or significant osseous findings. IMPRESSION: Status post gastric sleeve, without evidence of bowel obstruction or inflammatory changes. Electronically Signed   By:  Jacob Moores M.D.   On: 01/03/2023 20:05    Procedures Procedures    Medications Ordered in ED Medications  sodium chloride 0.9 % bolus 1,000 mL (0 mLs Intravenous Stopped 01/03/23 1915)  iohexol (OMNIPAQUE) 300 MG/ML solution 100 mL (100 mLs Intravenous Contrast Given 01/03/23 1954)  oxyCODONE (Oxy IR/ROXICODONE) immediate release tablet 5 mg (5 mg Oral Given 01/03/23 2204)  sulfamethoxazole-trimethoprim (BACTRIM DS) 800-160 MG per tablet 1 tablet (1 tablet Oral Given 01/03/23 2204)  sodium chloride 0.9 % bolus 1,000 mL (0 mLs Intravenous Stopped 01/03/23 2257)    ED Course/ Medical Decision Making/ A&P                             Medical Decision Making Patient presenting with reduced p.o. intake status post gastric sleeve surgery on June 24.  She is trying to maintain p.o. intake as instructed by her surgeon which includes 2 cc of fluid every 15 minutes while awake along with her protein drinks every 3-4 hours.  She is not tolerating water well, stating it makes her nauseated but has been able to tolerate other juices such as Gatorade.  Concern for postoperative infection, abscess, ileus or obstruction, other postsurgical complication.  Labs and CT imaging were obtained as outlined below.  Workup today is reassuring.  She was given IV fluids here after which she felt improved.  She did have significant ketonuria also with trace leukocytes rare bacteria.  She does endorse reduced urinary frequency also some darker than normal urine and mild dysuria.  Urine culture has been ordered, I will cover her with a 3-day course of Bactrim while we are waiting this urine culture.  Amount and/or Complexity of Data Reviewed Labs: ordered.    Details: UA per above, 80 ketones, 30 protein trace leukocytes and rare bacteria small amount hemoglobin.  C-Met, lipase and CBC are normal range, specifically her WBC count is 6.7, LFTs are normal. Radiology: ordered.    Details: CT abdomen pelvis are reassuring with  no acute surgical findings.  Risk Prescription drug management.           Final Clinical Impression(s) / ED Diagnoses Final diagnoses:  Dehydration  Dysuria    Rx / DC Orders ED Discharge Orders          Ordered    sulfamethoxazole-trimethoprim (BACTRIM DS) 800-160 MG tablet  2 times daily        01/03/23 2212    oxyCODONE (ROXICODONE) 5 MG immediate release tablet  Every 6 hours PRN,   Status:  Discontinued  01/03/23 2303    oxyCODONE (ROXICODONE) 5 MG immediate release tablet  Every 6 hours PRN        01/03/23 2309              Burgess Amor, PA-C 01/03/23 2321    Vanetta Mulders, MD 01/04/23 1259

## 2023-01-03 NOTE — ED Triage Notes (Signed)
Pt via POV after gastric sleeve surgery on June 24th with continued n/v and suspected dehydration per surgeon. She was advised to come to ER for IV fluids. Pt is still on clear liquid diet. Estimates half a bottle of water each day since symptoms started. Pain currently rated 5/10 at incisional sites.

## 2023-01-03 NOTE — Progress Notes (Signed)
Subjective:  Patient ID: Jacqueline Collins, female    DOB: 1986/03/31  Age: 37 y.o. MRN: [id number]  CC: Chief Complaint  Patient presents with   Follow-up    PT states she has blood in her urine. PT would also like a referral for neurology for CPAP     HPI:  37 year old female with morbid obesity, hypertension, migraine, asthma, OSA, chronic constipation, GERD, hyperlipidemia presents for follow-up.  Patient recently had gastric sleeve on 6/24.  Patient reports that she has tenderness in the abdomen due to her incisions.  She also states that she has had blood in her urine.  She states that she called the office and was told that this was normal.  However, this is not a normal finding and is not a known complication of her surgery.  Will assess urine today.  Patient is on multiple medications most of which have expired or not been refilled.  Will reconcile medications today.  Patient requesting medication for migraine.  She has been on prophylactic medication with Emgality but she is having rash and itching associated with it and has a known severe allergy to Ajovy.  She takes [place] for abortive treatment.  Will discuss prophylactic options today.  Patient also states that she needs an asthma inhaler.  I previously prescribed Symbicort but she states that she cannot afford this.  Patient Active Problem List   Diagnosis Date Noted   Hematuria 01/03/2023   S/P laparoscopic sleeve gastrectomy 12/25/2022   Schatzki's ring 08/29/2022   Other hyperlipidemia 05/08/2022   Asthma 04/19/2022   Anxiety with depression 11/10/2020   Family history of colon cancer in mother 10/04/2020   Chronic idiopathic constipation 08/12/2020   OSA on CPAP 05/22/2020   Vitamin D deficiency 01/06/2020   Gastroesophageal reflux disease 01/06/2020   Migraine 10/01/2019   Morbid obesity (HCC) 10/01/2019   Essential hypertension 10/01/2019    Social Hx   Social History   Socioeconomic History   Marital  status: Significant Other    Spouse name: Not on file   Number of children: Not on file   Years of education: Not on file   Highest education level: Some college, no degree  Occupational History   Occupation: Company secretary  Tobacco Use   Smoking status: Former    Types: E-cigarettes   Smokeless tobacco: Never  Building services engineer Use: Every day   Substances: Nicotine, CBD, Flavoring  Substance and Sexual Activity   Alcohol use: Yes    Comment: occ   Drug use: Not Currently    Types: Other-see comments   Sexual activity: Yes    Birth control/protection: None    Comment: has female partner  Other Topics Concern   Not on file  Social History Narrative   Lives with girlfriend [name]      Enjoys: likes to travel, not very outdoorsy, did like sports, close with family      Diet: eats all food-snacks alot   Caffeine: green tea, detox tea, rare soda, some coffee at [employer]: 3-4 bottles daily      Wears seat belt   Does not use phone while driving   Smoke detectors at home    Right handed   Social Determinants of Health   Financial Resource Strain: High Risk (09/25/2022)   Overall Financial Resource Strain (CARDIA)    Difficulty of Paying Living Expenses: Hard  Food Insecurity: Food Insecurity Present (12/26/2022)   Hunger Vital Sign  Worried About Programme researcher, broadcasting/film/video in the Last Year: Often true    Ran Out of Food in the Last Year: Sometimes true  Transportation Needs: No Transportation Needs (12/26/2022)   PRAPARE - Administrator, Civil Service (Medical): No    Lack of Transportation (Non-Medical): No  Physical Activity: Insufficiently Active (09/25/2022)   Exercise Vital Sign    Days of Exercise per Week: 2 days    Minutes of Exercise per Session: 40 min  Stress: Stress Concern Present (09/25/2022)   [employer] of Occupational Health - Occupational Stress Questionnaire    Feeling of Stress : To some extent  Social Connections:  Unknown (09/25/2022)   Social Connection and Isolation Panel [NHANES]    Frequency of Communication with Friends and Family: Once a week    Frequency of Social Gatherings with Friends and Family: Once a week    Attends Religious Services: Patient declined    Database administrator or Organizations: No    Attends Engineer, structural: Not on file    Marital Status: Patient declined    Review of Systems Per HPI  Objective:  BP 117/83   Pulse 63   Temp 97.9 F (36.6 C)   Ht 5\' 2"  (1.575 m)   Wt (!) 324 lb 12.8 oz (147.3 kg)   LMP 11/01/2022 Comment: 12-25-22 Ua pregnancy test  Negative  SpO2 99%   BMI 59.41 kg/m      01/02/2023    1:04 PM 12/26/2022    1:33 PM 12/26/2022    1:32 PM  BP/Weight  Systolic BP 117 146 146  Diastolic BP 83 87 87  Wt. (Lbs) 324.8    BMI 59.41 kg/m2      Physical Exam Vitals and nursing note reviewed.  Constitutional:      Appearance: Normal appearance. She is obese.  HENT:     Head: Normocephalic and atraumatic.  Cardiovascular:     Rate and Rhythm: Normal rate and regular rhythm.  Pulmonary:     Effort: Pulmonary effort is normal.     Breath sounds: Normal breath sounds. No wheezing, rhonchi or rales.  Abdominal:     Palpations: Abdomen is soft.     Comments: Surgical sites appear to be healing well.  There is no evidence of infection.  Neurological:     Mental Status: She is alert.     Lab Results  Component Value Date   WBC 12.0 (H) 12/26/2022   HGB 12.8 12/26/2022   HCT 39.0 12/26/2022   PLT 381 12/26/2022   GLUCOSE 190 (H) 12/26/2022   CHOL 137 03/16/2022   TRIG 107 03/16/2022   HDL 33 (L) 03/16/2022   LDLCALC 84 03/16/2022   ALT 16 12/26/2022   AST 22 12/26/2022   NA 136 12/26/2022   K 4.4 12/26/2022   CL 105 12/26/2022   CREATININE 0.80 12/26/2022   BUN 8 12/26/2022   CO2 21 (L) 12/26/2022   TSH 3.340 08/15/2021   HGBA1C 5.1 12/12/2022     Assessment & Plan:   Problem List Items Addressed This Visit        Cardiovascular and Mediastinum   Essential hypertension    Blood pressure is well-controlled today.  Patient has been on Lasix but this has not been prescribed since 2022.  Discontinuing.      Migraine    Placing on Nurtec.  Will see if insurance will cover.      Relevant Medications   Rimegepant  Sulfate (NURTEC) 75 MG TBDP     Respiratory   Asthma    Sending in [place].      Relevant Medications   fluticasone furoate-vilanterol (BREO ELLIPTA) 100-25 MCG/ACT AEPB     Other   Hematuria (Chronic)    Confirming hematuria with microscopy.  Sending culture.   Referring to urology.      Relevant Orders   POCT URINALYSIS DIP (CLINITEK) (Completed)   Urinalysis, microscopic only   Urine Culture    Meds ordered this encounter  Medications   Rimegepant Sulfate (NURTEC) 75 MG TBDP    Sig: Take 1 tablet (75 mg total) by mouth every other day.    Dispense:  45 tablet    Refill:  3   fluticasone furoate-vilanterol (BREO ELLIPTA) 100-25 MCG/ACT AEPB    Sig: Inhale 1 puff into the lungs daily.    Dispense:  60 each    Refill:  5    Follow-up:  3-6 months  Endre Coutts Adriana Simas DO [facility]

## 2023-01-03 NOTE — Discharge Instructions (Addendum)
Make sure you are keeping up with your fluid intake as instructed by your surgeon.  Take the remaining antibiotics prescribed for your urinary symptoms.  You have been prescribed a small quantity of oxycodone to help with pain, do not drive within 4 hours of taking this medication.

## 2023-01-03 NOTE — ED Provider Triage Note (Signed)
Emergency Medicine Provider Triage Evaluation Note  Jacqueline Collins , a 37 y.o. female  was evaluated in triage.  Pt complains of dehydration.  Had gastric sleeve on 6/24 and states for the past several days she is not able to drink enough to keep hydrated.  Denies fever or chills, no abdominal pain nausea or vomiting.  States she called her surgeon's office and they advise she come get some fluids and be evaluated in the ED.  Review of Systems  Positive: As above Negative: Fever, abdominal pain, nausea or vomiting  Physical Exam  BP (!) 122/91   Pulse 71   Temp 98.8 F (37.1 C) (Oral)   Resp 18   Ht 5\' 2"  (1.575 m)   Wt (!) 147.2 kg   LMP 11/01/2022 Comment: 12-25-22 Ua pregnancy test  Negative  SpO2 99%   BMI 59.35 kg/m  Gen:   Awake, no distress   Resp:  Normal effort  MSK:   Moves extremities without difficulty  Other:    Medical Decision Making  Medically screening exam initiated at 4:14 PM.  Appropriate orders placed.  Jacqueline Collins was informed that the remainder of the evaluation will be completed by another provider, this initial triage assessment does not replace that evaluation, and the importance of remaining in the ED until their evaluation is complete.     Ma Rings, [place] 01/03/23 1615

## 2023-01-03 NOTE — Assessment & Plan Note (Signed)
Sending in [place].

## 2023-01-05 LAB — URINE CULTURE: Culture: <10000 — AB

## 2023-01-06 LAB — URINE CULTURE

## 2023-01-06 LAB — SPECIMEN STATUS REPORT

## 2023-01-09 ENCOUNTER — Encounter: Payer: Self-pay | Admitting: Dietician

## 2023-01-09 ENCOUNTER — Encounter: Payer: 59 | Attending: General Surgery | Admitting: Dietician

## 2023-01-09 VITALS — Ht 62.0 in | Wt 323.6 lb

## 2023-01-09 DIAGNOSIS — E669 Obesity, unspecified: Secondary | ICD-10-CM | POA: Insufficient documentation

## 2023-01-09 NOTE — Progress Notes (Signed)
2 Week Post-Operative Nutrition Class   Patient was seen on 01/09/2023 for Post-Operative Nutrition education at the [facility].    Surgery date: 12/25/2022 Surgery type: Sleeve Gastrectomy  Anthropometrics  Start weight at [facility]: 337.9 lbs (date: 08/24/2022) Height: 62 in Weight today: 323.6 lbs.   Clinical  Pharmacotherapy: History of weight loss medication used: Mounjaro  Medical hx: asthma, sleep apnea, obesity Medications: emgaity, vit D montelukast, albu  Labs: HDL 33;  Notable signs/symptoms: none noted Any previous deficiencies? No Bowel Habits: Every day to every other day no complaints   Body Composition Scale 01/09/2023  Current Body Weight 323.6  Total Body Fat % 51.1  Visceral Fat 21  Fat-Free Mass % 48.8   Total Body Water % 38.9  Muscle-Mass lbs 31.9  BMI 59.1  Body Fat Displacement          Torso  lbs 102.7         Left Leg  lbs 20.5         Right Leg  lbs 20.5         Left Arm  lbs 10.2         Right Arm  lbs 10.2    The following the learning objectives were met by the patient during this course: Identifies Soft Prepped Plan Advancement Guide  Identifies Soft, High Proteins (Phase 1), beginning 2 weeks post-operatively to 3 weeks post-operatively Identifies Additional Soft High Proteins, soft non-starchy vegetables, fruits and starches (Phase 2), beginning 3 weeks post-operatively to 3 months post-operatively Identifies appropriate sources of fluids, proteins, vegetables, fruits and starches Identifies appropriate fat sources and healthy verses unhealthy fat types   States protein, vegetable, fruit and starch recommendations and appropriate sources post-operatively Identifies the need for appropriate texture modifications, mastication, and bite sizes when consuming solids Identifies appropriate fat consumption and sources Identifies appropriate multivitamin and calcium sources post-operatively Describes the need for  physical activity post-operatively and will follow MD recommendations States when to call healthcare provider regarding medication questions or post-operative complications   Handouts given during class include: Soft Prepped Plan Advancement Guide   Follow-Up Plan: Patient will follow-up at [facility] in 10 weeks for 3 month post-op nutrition visit for diet advancement per MD.

## 2023-01-17 ENCOUNTER — Telehealth: Payer: Self-pay | Admitting: Dietician

## 2023-01-17 NOTE — Telephone Encounter (Signed)
RD called pt to verify fluid intake once starting soft, solid proteins 2 week post-bariatric surgery.   Daily Fluid intake: 64 oz Daily Protein intake: 60 gram (with protein shakes) Bowel Habits: constipation; Pt states she saw her surgeon today and surgeon recommended that she take her linzess and add Miralax. Dietitian agrees.  Concerns/issues: not much solid foods, stating she gets full after two bites, and hungry an hour or two later. Pt states she has hunger pain too. Pt states surgeon recommended that she continue protein shakes. Dietitian agrees to not rush the solid food, continuing solid food as tolerated, supplementing with protein shakes.

## 2023-01-23 ENCOUNTER — Telehealth: Payer: Self-pay | Admitting: *Deleted

## 2023-01-23 ENCOUNTER — Other Ambulatory Visit: Payer: Self-pay

## 2023-01-23 DIAGNOSIS — G43909 Migraine, unspecified, not intractable, without status migrainosus: Secondary | ICD-10-CM

## 2023-01-23 NOTE — Telephone Encounter (Signed)
Tommie Sams, DO  Insurance denied Nurtec. Recommend referral to neurology as they want her to have tried several other medication options.  I think it is easiest and best to see neurology as I think they will have better luck getting her medications covered

## 2023-02-05 ENCOUNTER — Encounter: Payer: Self-pay | Admitting: Gastroenterology

## 2023-02-14 ENCOUNTER — Ambulatory Visit (INDEPENDENT_AMBULATORY_CARE_PROVIDER_SITE_OTHER): Payer: 59 | Admitting: Family Medicine

## 2023-02-14 ENCOUNTER — Ambulatory Visit (HOSPITAL_COMMUNITY)
Admission: RE | Admit: 2023-02-14 | Discharge: 2023-02-14 | Disposition: A | Discharge status: Home / Self Care | Payer: 59 | Source: Ambulatory Visit | Attending: Family Medicine | Admitting: Family Medicine

## 2023-02-14 VITALS — BP 107/81 | HR 85 | Temp 97.5°F | Ht 62.0 in | Wt 313.0 lb

## 2023-02-14 DIAGNOSIS — M25571 Pain in right ankle and joints of right foot: Secondary | ICD-10-CM | POA: Diagnosis present

## 2023-02-14 NOTE — Patient Instructions (Signed)
Xray today.  No concerns for clot, internal injuries or other issues.  Tylenol as needed.  Take care  Dr. Adriana Simas

## 2023-02-15 DIAGNOSIS — M25571 Pain in right ankle and joints of right foot: Secondary | ICD-10-CM | POA: Insufficient documentation

## 2023-02-15 NOTE — Progress Notes (Signed)
Subjective:  Patient ID: Jacqueline Collins, female    DOB: 07-26-1985  Age: 37 y.o. MRN: [id number]  CC:  Fall   HPI:  37 year old female presents for evaluation of the above.  Patient suffered a fall on Monday.  She states that she twisted her ankle and subsequently fell down.  She reports right ankle soreness/pain and also reports generalized soreness particularly on the left side where she fell.  Patient is concerned about the need for further imaging or workup given recent bariatric surgery.  Patient also concerned about the possibility of blood clots.  She has not been immobilized.  She is able to ambulate normally.  No reports of calf swelling.    Patient Active Problem List   Diagnosis Date Noted   Acute right ankle pain 02/15/2023   Hematuria 01/03/2023   S/P laparoscopic sleeve gastrectomy 12/25/2022   Schatzki's ring 08/29/2022   Other hyperlipidemia 05/08/2022   Asthma 04/19/2022   Anxiety with depression 11/10/2020   Family history of colon cancer in mother 10/04/2020   Chronic idiopathic constipation 08/12/2020   OSA on CPAP 05/22/2020   Vitamin D deficiency 01/06/2020   Gastroesophageal reflux disease 01/06/2020   Migraine 10/01/2019   Morbid obesity (HCC) 10/01/2019   Essential hypertension 10/01/2019    Social Hx   Social History   Socioeconomic History   Marital status: Significant Other    Spouse name: Not on file   Number of children: Not on file   Years of education: Not on file   Highest education level: Some college, no degree  Occupational History   Occupation: Company secretary  Tobacco Use   Smoking status: Former    Types: E-cigarettes   Smokeless tobacco: Never  Vaping Use   Vaping status: Every Day   Substances: Nicotine, CBD, Flavoring  Substance and Sexual Activity   Alcohol use: Yes    Comment: occ   Drug use: Not Currently    Types: Other-see comments   Sexual activity: Yes    Birth control/protection: None    Comment: has female  partner  Other Topics Concern   Not on file  Social History Narrative   Lives with girlfriend [name]      Enjoys: likes to travel, not very outdoorsy, did like sports, close with family      Diet: eats all food-snacks alot   Caffeine: green tea, detox tea, rare soda, some coffee at [employer]: 3-4 bottles daily      Wears seat belt   Does not use phone while driving   Smoke detectors at home    Right handed   Social Determinants of Health   Financial Resource Strain: High Risk (09/25/2022)   Overall Financial Resource Strain (CARDIA)    Difficulty of Paying Living Expenses: Hard  Food Insecurity: Food Insecurity Present (12/26/2022)   Hunger Vital Sign    Worried About Running Out of Food in the Last Year: Often true    Ran Out of Food in the Last Year: Sometimes true  Transportation Needs: No Transportation Needs (12/26/2022)   PRAPARE - Administrator, Civil Service (Medical): No    Lack of Transportation (Non-Medical): No  Physical Activity: Insufficiently Active (09/25/2022)   Exercise Vital Sign    Days of Exercise per Week: 2 days    Minutes of Exercise per Session: 40 min  Stress: Stress Concern Present (09/25/2022)   [employer] of Occupational Health - Occupational Stress Questionnaire    Feeling  of Stress : To some extent  Social Connections: Unknown (09/25/2022)   Social Connection and Isolation Panel [NHANES]    Frequency of Communication with Friends and Family: Once a week    Frequency of Social Gatherings with Friends and Family: Once a week    Attends Religious Services: Patient declined    Database administrator or Organizations: No    Attends Engineer, structural: Not on file    Marital Status: Patient declined    Review of Systems Per HPI  Objective:  BP 107/81   Pulse 85   Temp (!) 97.5 F (36.4 C)   Ht 5\' 2"  (1.575 m)   Wt (!) 313 lb (142 kg)   SpO2 97%   BMI 57.25 kg/m      02/14/2023   10:41 AM  01/09/2023    5:50 PM 01/03/2023   10:20 PM  BP/Weight  Systolic BP 107  952  Diastolic BP 81  80  Wt. (Lbs) 313 323.6   BMI 57.25 kg/m2 59.19 kg/m2     Physical Exam Vitals and nursing note reviewed.  Constitutional:      General: She is not in acute distress.    Appearance: She is obese.  HENT:     Head: Normocephalic and atraumatic.  Eyes:     General:        Right eye: No discharge.        Left eye: No discharge.     Conjunctiva/sclera: Conjunctivae normal.  Cardiovascular:     Rate and Rhythm: Normal rate and regular rhythm.  Pulmonary:     Effort: Pulmonary effort is normal.     Breath sounds: Normal breath sounds. No wheezing or rales.  Abdominal:     General: There is no distension.     Palpations: Abdomen is soft.     Tenderness: There is no abdominal tenderness.  Musculoskeletal:     Comments: Right ankle with tenderness laterally.  No appreciable swelling or effusion.  Neurological:     Mental Status: She is alert.     Lab Results  Component Value Date   WBC 6.7 01/03/2023   HGB 13.5 01/03/2023   HCT 40.8 01/03/2023   PLT 327 01/03/2023   GLUCOSE 94 01/03/2023   CHOL 137 03/16/2022   TRIG 107 03/16/2022   HDL 33 (L) 03/16/2022   LDLCALC 84 03/16/2022   ALT 17 01/03/2023   AST 17 01/03/2023   NA 135 01/03/2023   K 4.0 01/03/2023   CL 101 01/03/2023   CREATININE 0.89 01/03/2023   BUN 9 01/03/2023   CO2 22 01/03/2023   TSH 3.340 08/15/2021   HGBA1C 5.1 12/12/2022     Assessment & Plan:   Problem List Items Addressed This Visit       Other   Acute right ankle pain - Primary    Patient recently suffered a fall.  X-ray obtained and was independent reviewed by me.  Interpretation: Normal x-ray.  Patient very concerned.  Reassurance provided.  There is no evidence of DVT or significant trauma.  Advised Tylenol and supportive care.      Relevant Orders   DG Ankle Complete Right (Completed)   Everlene Other DO [facility]

## 2023-02-15 NOTE — Assessment & Plan Note (Signed)
Patient recently suffered a fall.  X-ray obtained and was independent reviewed by me.  Interpretation: Normal x-ray.  Patient very concerned.  Reassurance provided.  There is no evidence of DVT or significant trauma.  Advised Tylenol and supportive care.

## 2023-02-23 ENCOUNTER — Telehealth: Payer: Self-pay

## 2023-02-23 ENCOUNTER — Other Ambulatory Visit: Payer: Self-pay | Admitting: Family Medicine

## 2023-02-23 MED ORDER — UBRELVY 100 MG PO TABS: 100.0000 mg | ORAL_TABLET | Freq: Every day | ORAL | 11 refills | Status: DC | PRN

## 2023-02-23 NOTE — Telephone Encounter (Signed)
See MyChart message

## 2023-02-23 NOTE — Telephone Encounter (Signed)
Pt is having a migraine and Dr Adriana Simas referred to neurology they are booked out through October and wanted to see if Dr Adriana Simas can send something in she has been off her meds since beginning of June.  Jacqueline Collins   [phone] Fannie Knee

## 2023-03-12 ENCOUNTER — Emergency Department (HOSPITAL_COMMUNITY): Payer: 59

## 2023-03-12 ENCOUNTER — Encounter (HOSPITAL_COMMUNITY): Payer: Self-pay | Admitting: Emergency Medicine

## 2023-03-12 ENCOUNTER — Other Ambulatory Visit: Payer: Self-pay | Admitting: Family Medicine

## 2023-03-12 ENCOUNTER — Emergency Department (HOSPITAL_COMMUNITY)
Admission: EM | Admit: 2023-03-12 | Discharge: 2023-03-12 | Disposition: A | Discharge status: Home / Self Care | Payer: 59 | Attending: Emergency Medicine | Admitting: Emergency Medicine

## 2023-03-12 ENCOUNTER — Encounter: Payer: Self-pay | Admitting: Family Medicine

## 2023-03-12 ENCOUNTER — Other Ambulatory Visit: Payer: Self-pay

## 2023-03-12 DIAGNOSIS — M79604 Pain in right leg: Secondary | ICD-10-CM | POA: Diagnosis present

## 2023-03-12 DIAGNOSIS — M79605 Pain in left leg: Secondary | ICD-10-CM | POA: Diagnosis not present

## 2023-03-12 MED ORDER — HYDROCODONE-ACETAMINOPHEN 5-325 MG PO TABS: 1.0000 | ORAL_TABLET | ORAL | 0 refills | Status: DC | PRN

## 2023-03-12 MED ORDER — LIDOCAINE 5 % EX PTCH: 1.0000 | MEDICATED_PATCH | CUTANEOUS | 0 refills | Status: DC

## 2023-03-12 NOTE — ED Provider Notes (Signed)
[place] EMERGENCY DEPARTMENT AT [facility] Provider Note   CSN: [id number] Arrival date & time: 03/12/23  1017     History  Chief Complaint  Patient presents with   Leg Pain    Jacqueline Collins is a 37 y.o. female for evaluation of bilateral leg pain.  Left greater than right.  Is been ongoing for some time.  Worse with movement.  Starts in bilateral buttocks, goes down posterior left gluteus and down the lateral aspect leg, now occurring on the right.  No fever, bowel or bladder incontinence, saddle paresthesia.  Taking Tylenol without relief.  Cannot take NSAIDs due to recent gastric bypass surgery.  No recent injury or trauma.  No abdominal pain.  No history of IVDU, malignancy.  She states she was told she needed an ultrasound to rule out a blood clot given her recent surgery.  No chest pain, shortness of breath, leg swelling, redness or warmth.  HPI     Home Medications Prior to Admission medications   Medication Sig Start Date End Date Taking? Authorizing Provider  HYDROcodone-acetaminophen (NORCO/VICODIN) 5-325 MG tablet Take 1 tablet by mouth every 4 (four) hours as needed. 03/12/23  Yes Timmy Cleverly A, PA-C  lidocaine (LIDODERM) 5 % Place 1 patch onto the skin daily. Remove & Discard patch within 12 hours or as directed by MD 03/12/23  Yes Evin Chirco A, PA-C  albuterol (VENTOLIN HFA) 108 (90 Base) MCG/ACT inhaler INHALE 1 TO 2 PUFFS BY MOUTH EVERY 6 HOURS AS NEEDED FOR WHEEZING FOR SHORTNESS OF BREATH Patient taking differently: Inhale 1-2 puffs into the lungs every 6 (six) hours as needed for shortness of breath or wheezing. 12/06/22   Tommie Sams, DO  carboxymethylcellulose (REFRESH PLUS) 0.5 % SOLN Place 1 drop into both eyes 3 (three) times daily as needed (dry eyes).    [provider]  cyclobenzaprine (FLEXERIL) 10 MG tablet Take 10 mg by mouth 3 (three) times daily as needed for muscle spasms.    [provider]  fluticasone (FLONASE)  50 MCG/ACT nasal spray Place 2 sprays into both nostrils daily. Patient taking differently: Place 2 sprays into both nostrils daily as needed for allergies. 09/16/21   Couture, Cortni S, PA-C  fluticasone furoate-vilanterol (BREO ELLIPTA) 100-25 MCG/ACT AEPB Inhale 1 puff into the lungs daily. 01/02/23   Tommie Sams, DO  hydrocortisone cream 1 % Apply 1 Application topically daily as needed for itching.    [provider]  linaclotide (LINZESS) 145 MCG CAPS capsule Take 1 capsule (145 mcg total) by mouth daily before breakfast. Patient taking differently: Take 145 mcg by mouth every other day. 08/29/22   Tiffany Kocher, PA-C  Multiple Vitamins-Minerals (WOMENS MULTIVITAMIN) TABS Take 1 tablet by mouth daily.    [provider]  ondansetron (ZOFRAN-ODT) 4 MG disintegrating tablet Take 1 tablet (4 mg total) by mouth every 6 (six) hours as needed for nausea or vomiting. 12/26/22   Gaynelle Adu, MD  oxyCODONE (ROXICODONE) 5 MG immediate release tablet Take 1 tablet (5 mg total) by mouth every 6 (six) hours as needed for severe pain. 01/03/23   Burgess Amor, PA-C  pantoprazole (PROTONIX) 40 MG tablet Take 1 tablet (40 mg total) by mouth daily. 11/06/22 11/06/23  Lanelle Bal, DO  Rimegepant Sulfate (NURTEC) 75 MG TBDP Take 1 tablet (75 mg total) by mouth every other day. 01/02/23   Cook, Jayce G, DO  Ubrogepant (UBRELVY) 100 MG TABS Take 1 tablet (100  mg total) by mouth daily as needed. Take one tablet at onset of headache, may repeat 1 tablet in 2 hours, no more than 2 tablets in 24 hours 02/23/23   Tommie Sams, DO      Allergies    Ajovy [[id number]]    Review of Systems   Review of Systems  Constitutional: Negative.   HENT: Negative.    Respiratory: Negative.    Cardiovascular: Negative.   Gastrointestinal: Negative.   Genitourinary: Negative.   Musculoskeletal:  Positive for back pain.  Skin: Negative.   Neurological: Negative.   All other systems reviewed and are  negative.   Physical Exam Updated Vital Signs BP 110/78   Pulse 88   Temp 98 F (36.7 C)   Resp 20   Ht 5\' 2"  (1.575 m)   Wt (!) 142 kg   LMP 03/05/2023   SpO2 99%   BMI 57.25 kg/m  Physical Exam Vitals and nursing note reviewed.  Constitutional:      General: She is not in acute distress.    Appearance: She is well-developed. She is not ill-appearing, toxic-appearing or diaphoretic.  HENT:     Head: Atraumatic.     Nose: Nose normal.     Mouth/Throat:     Mouth: Mucous membranes are moist.  Eyes:     Pupils: Pupils are equal, round, and reactive to light.  Cardiovascular:     Rate and Rhythm: Normal rate.     Pulses: Normal pulses.          Dorsalis pedis pulses are 2+ on the right side and 2+ on the left side.     Heart sounds: Normal heart sounds.  Pulmonary:     Effort: Pulmonary effort is normal. No respiratory distress.     Breath sounds: Normal breath sounds.  Abdominal:     General: Bowel sounds are normal. There is no distension.     Palpations: Abdomen is soft.     Tenderness: There is no abdominal tenderness. There is no guarding or rebound.     Hernia: No hernia is present.  Musculoskeletal:        General: Normal range of motion.     Cervical back: Normal range of motion.     Comments: No bony tenderness, compartments soft, negative straight leg raise bilaterally.  Skin:    General: Skin is warm and dry.     Capillary Refill: Capillary refill takes less than 2 seconds.     Comments: No edema, erythema or warmth  Neurological:     General: No focal deficit present.     Mental Status: She is alert.     Comments: Intact sensation Equal strength Ambulatory without difficulty  Psychiatric:        Mood and Affect: Mood normal.     ED Results / Procedures / Treatments   Labs (all labs ordered are listed, but only abnormal results are displayed) Labs Reviewed - No data to display  EKG None  Radiology US Venous Img Lower Bilateral  (DVT)  Result Date: 03/12/2023 CLINICAL DATA:  Bilateral lower extremity pain for 3 days, left-greater-than-right EXAM: Bilateral LOWER EXTREMITY VENOUS DOPPLER ULTRASOUND TECHNIQUE: Gray-scale sonography with compression, as well as color and duplex ultrasound, were performed to evaluate the deep venous system(s) from the level of the common femoral vein through the popliteal and proximal calf veins. COMPARISON:  None Available. FINDINGS: VENOUS Normal compressibility of the common femoral, superficial femoral, and popliteal veins, as well as the  visualized calf veins. Visualized portions of profunda femoral vein and great saphenous vein unremarkable. No filling defects to suggest DVT on grayscale or color Doppler imaging. Doppler waveforms show normal direction of venous flow, normal respiratory plasticity and response to augmentation. Limited views of the contralateral common femoral vein are unremarkable. OTHER None. Limitations: none IMPRESSION: No evidence of bilateral lower extremity DVT. Electronically Signed   By: Karen Kays M.D.   On: 03/12/2023 12:26    Procedures Procedures    Medications Ordered in ED Medications - No data to display  ED Course/ Medical Decision Making/ A&P   37 year old here for evaluation of atraumatic leg pain.  Initially started at left, down left gluteal region none lateral aspect leg, now starting on the right.  Worse with movement.  No recent falls or injuries.  No leg swelling, redness, warmth.  No overt back pain.  No abdominal pain.  A nonfocal neuroexam without deficits.  Negative straight leg raise bilaterally.  Compartments are soft.  Apparently was told she need to rule out a blood clot.  Imaging personally viewed interpreted Negative for DVT  I suspect patient likely has some degree of radiculopathy either sciatica versus meralgia paresthetica.  Unfortunately limited on medication due to recent gastric bypass surgery, do not want to start her on NSAIDs,  states difficulty with steroids.  Discussed lidocaine patches, close follow-up with PCP for further workup, if this more radiculopathy and cannot take p.o. meds possibly benefit from more of a directed prior history of steroid injection.  Short course of pain medicine written given she cannot take NSAIDs at this time I low suspicion for acute AAA, dissection, cauda equina, discitis, osteomyelitis, transverse myelitis, psoas abscess, VTE, ischemia, compartment syndrome, fracture, dislocation  The patient has been appropriately medically screened and/or stabilized in the ED. I have low suspicion for any other emergent medical condition which would require further screening, evaluation or treatment in the ED or require inpatient management.  Patient is hemodynamically stable and in no acute distress.  Patient able to ambulate in department prior to ED.  Evaluation does not show acute pathology that would require ongoing or additional emergent interventions while in the emergency department or further inpatient treatment.  I have discussed the diagnosis with the patient and answered all questions.  Pain is been managed while in the emergency department and patient has no further complaints prior to discharge.  Patient is comfortable with plan discussed in room and is stable for discharge at this time.  I have discussed strict return precautions for returning to the emergency department.  Patient was encouraged to follow-up with PCP/specialist refer to at discharge.                                  Medical Decision Making Amount and/or Complexity of Data Reviewed Independent Historian: friend External Data Reviewed: labs, radiology and notes. Radiology: ordered and independent interpretation performed. Decision-making details documented in ED Course.  Risk OTC drugs. Prescription drug management. Diagnosis or treatment significantly limited by social determinants of health.          Final  Clinical Impression(s) / ED Diagnoses Final diagnoses:  Pain in both lower extremities    Rx / DC Orders ED Discharge Orders          Ordered    lidocaine (LIDODERM) 5 %  Every 24 hours        03/12/23 1243  HYDROcodone-acetaminophen (NORCO/VICODIN) 5-325 MG tablet  Every 4 hours PRN        03/12/23 1245              Elsworth Ledin A, PA-C 03/12/23 1827    Derwood Kaplan, MD 03/15/23 2200

## 2023-03-12 NOTE — Discharge Instructions (Addendum)
Make sure to follow-up with your primary care provider  Return for new or worsening symptoms

## 2023-03-12 NOTE — ED Triage Notes (Signed)
Pt c/o bilateral le pain, worse on left x 3 days. Denies injury.

## 2023-03-13 ENCOUNTER — Telehealth: Payer: Self-pay | Admitting: Orthopedic Surgery

## 2023-03-13 NOTE — Telephone Encounter (Signed)
Everlene Other G, DO     Needs current/up to date xray of the lumbar spine first.

## 2023-03-13 NOTE — Telephone Encounter (Signed)
Returned the patient's call, lvm for her to cb to schedule.  She went to the ED 9/09, Dr. Dallas Schimke was on call.

## 2023-03-23 ENCOUNTER — Telehealth: Payer: Self-pay

## 2023-03-23 NOTE — Telephone Encounter (Signed)
Legal aid is calling wanting Dr Adriana Simas to reach out regarding  In February he wrote a letter that she can't work and if he still thinks that. If so can they send a form for him to fill out?   Pt is stating that she is having stress, pain, concentration, fatigue legal aid is wanting to know if Dr Adriana Simas agrees with that?   Maryan Rued [employer] through company that she works for [phone]

## 2023-03-30 ENCOUNTER — Ambulatory Visit: Payer: 59 | Admitting: Dietician

## 2023-03-30 DIAGNOSIS — Z0279 Encounter for issue of other medical certificate: Secondary | ICD-10-CM

## 2023-04-03 ENCOUNTER — Encounter: Payer: Self-pay | Admitting: Dietician

## 2023-04-03 ENCOUNTER — Encounter: Payer: 59 | Attending: General Surgery | Admitting: Dietician

## 2023-04-03 VITALS — Ht 62.0 in | Wt 296.7 lb

## 2023-04-03 DIAGNOSIS — E669 Obesity, unspecified: Secondary | ICD-10-CM | POA: Insufficient documentation

## 2023-04-03 NOTE — Progress Notes (Signed)
Bariatric Nutrition Follow-Up Visit Medical Nutrition Therapy  Appt Start Time: 10:51   End Time: 11:40  Surgery date: 12/25/2022 Surgery type: Sleeve Gastrectomy  NUTRITION ASSESSMENT   Anthropometrics  Start weight at [facility]: 337.9 lbs (date: 08/24/2022) Height: 62 in Weight today: 296.7 lbs.   Clinical  Pharmacotherapy: History of weight loss medication used: Mounjaro  Medical hx: asthma, sleep apnea, obesity Medications: emgaity, vit D montelukast, albu  Labs: HDL 33;  Notable signs/symptoms: none noted Any previous deficiencies? No Bowel Habits: Every day to every other day no complaints   Body Composition Scale 01/09/2023 04/03/2023  Current Body Weight 323.6 296.7  Total Body Fat % 51.1 49.5  Visceral Fat 21 19  Fat-Free Mass % 48.8 50.4   Total Body Water % 38.9 39.7  Muscle-Mass lbs 31.9 31.6  BMI 59.1 54.2  Body Fat Displacement           Torso  lbs 102.7 91.2         Left Leg  lbs 20.5 18.2         Right Leg  lbs 20.5 18.2         Left Arm  lbs 10.2 9.1         Right Arm  lbs 10.2 9.1     Lifestyle & Dietary Hx  Pt states her legs have been giving her a lot of pain, stating that it has effected her sleep. Pt states she puts icy hot patches on her legs at night, stating that it was prescribed to her. Pt states doctors are trying to figure out the cause of her leg pain. Pt states she is fighting a disability case, stating she has been denied a few times. Pt states her blood sugars have been up and down, stating she checks it daily. Pt states she has been having lows. Pt states she has had gas and constipation, stating she has always had constipation, even before surgery. Pt states she gets winded a lot, when she increases physical activity. Pt states she can have 2 bites of meat and feel full. Pt states she found a therapist, stating she has her first appointment this week. Pt states she has pain sometimes in one of her incisions. Pt states her weight goal is  200 lbs.  Estimated daily fluid intake: 64 oz Estimated daily protein intake: 60 g Supplements: multivitamin and calcium Current average weekly physical activity: ADLs, walk the dog.   24-Hr Dietary Recall First Meal: protein shake, banana or grits Snack: 1/2 protein bar  Second Meal: chicken (air fried) Snack:  1/2 protein bar Third Meal: chicken Snack:  Beverages: water, orange juice  Post-Op Goals/ Signs/ Symptoms Using straws: yes Drinking while eating: no Chewing/swallowing difficulties: no Changes in vision: no Changes to mood/headaches: no Hair loss/changes to skin/nails: no Difficulty focusing/concentrating: no Sweating: no Limb weakness: no Dizziness/lightheadedness:  Palpitations: no  Carbonated/caffeinated beverages: no N/V/D/C/Gas: gas and constipation Abdominal pain: no Dumping syndrome: no    NUTRITION DIAGNOSIS  Overweight/obesity ([place]-3.3) related to past poor dietary habits and physical inactivity as evidenced by completed bariatric surgery and following dietary guidelines for continued weight loss and healthy nutrition status.     NUTRITION INTERVENTION Nutrition counseling (C-1) and education (E-2) to facilitate bariatric surgery goals, including: Diet advancement to the standard prep plan The importance of consuming adequate calories as well as certain nutrients daily due to the body's need for essential vitamins, minerals, and fats The importance of daily physical activity and to reach  a goal of at least 150 minutes of moderate to vigorous physical activity weekly (or as directed by their physician) due to benefits such as increased musculature and improved lab values The importance of intuitive eating specifically learning hunger-satiety cues and understanding the importance of learning a new body: The importance of mindful eating to avoid grazing behaviors   Goals Continue: chew well; slow down; aim for satisfaction instead of fullness; eat  throughout the day to avoid sugar lows. New: try eating some non-starchy vegetables and/or complex carbohydrates before protein/meat to see if you can tolerate both. New: try to increase volume of food to decrease need for protein shakes.  Handouts Provided Include  Standard Prep Plan Advancement Guide  Learning Style & Readiness for Change Teaching method utilized: Visual & Auditory  Demonstrated degree of understanding via: Teach Back  Readiness Level: preparation Barriers to learning/adherence to lifestyle change: mobility (leg pain)  RD's Notes for Next Visit Assess adherence to pt chosen goals  MONITORING & EVALUATION Dietary intake, weekly physical activity, body weight.  Next Steps Patient is to follow-up in January for 6 month post-op follow-up.

## 2023-04-04 ENCOUNTER — Ambulatory Visit (INDEPENDENT_AMBULATORY_CARE_PROVIDER_SITE_OTHER): Payer: 59 | Admitting: Internal Medicine

## 2023-04-04 ENCOUNTER — Encounter: Payer: Self-pay | Admitting: Internal Medicine

## 2023-04-04 VITALS — BP 107/81 | HR 83 | Temp 98.1°F | Ht 62.0 in | Wt 297.7 lb

## 2023-04-04 DIAGNOSIS — R1084 Generalized abdominal pain: Secondary | ICD-10-CM

## 2023-04-04 DIAGNOSIS — K219 Gastro-esophageal reflux disease without esophagitis: Secondary | ICD-10-CM | POA: Diagnosis not present

## 2023-04-04 DIAGNOSIS — K581 Irritable bowel syndrome with constipation: Secondary | ICD-10-CM

## 2023-04-04 MED ORDER — PANTOPRAZOLE SODIUM 40 MG PO TBEC: 40.0000 mg | DELAYED_RELEASE_TABLET | Freq: Every day | ORAL | 3 refills | Status: AC

## 2023-04-04 NOTE — Patient Instructions (Signed)
I will give you samples of Linzess 290 mcg.  Let me know in a week if you like this dose better and I can send in formal prescription, otherwise I will refill your 145 mcg dosing.  Continue on pantoprazole for your chronic reflux.  I refilled this today.  Follow-up in 6 months or sooner if needed.  It was very nice seeing you again today.  Dr. Marletta Lor
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
## 2023-04-05 ENCOUNTER — Ambulatory Visit (INDEPENDENT_AMBULATORY_CARE_PROVIDER_SITE_OTHER): Payer: 59 | Admitting: Family Medicine

## 2023-04-05 VITALS — BP 100/62 | HR 70 | Temp 97.2°F | Ht 62.0 in | Wt 297.0 lb

## 2023-04-05 DIAGNOSIS — K589 Irritable bowel syndrome without diarrhea: Secondary | ICD-10-CM | POA: Insufficient documentation

## 2023-04-05 DIAGNOSIS — G8929 Other chronic pain: Secondary | ICD-10-CM | POA: Diagnosis not present

## 2023-04-05 DIAGNOSIS — F418 Other specified anxiety disorders: Secondary | ICD-10-CM

## 2023-04-05 DIAGNOSIS — K581 Irritable bowel syndrome with constipation: Secondary | ICD-10-CM

## 2023-04-05 DIAGNOSIS — G43909 Migraine, unspecified, not intractable, without status migrainosus: Secondary | ICD-10-CM

## 2023-04-05 MED ORDER — DULOXETINE HCL 30 MG PO CPEP: ORAL_CAPSULE | ORAL | 1 refills | Status: DC

## 2023-04-05 NOTE — Patient Instructions (Signed)
Medication as prescribed.  Referral placed.  Follow up in 3 months.

## 2023-04-08 DIAGNOSIS — G8929 Other chronic pain: Secondary | ICD-10-CM | POA: Insufficient documentation

## 2023-04-08 NOTE — Assessment & Plan Note (Signed)
Starting Cymbalta (hopefully with help with pain as well).

## 2023-04-08 NOTE — Progress Notes (Signed)
Subjective:  Patient ID: Jacqueline Collins, female    DOB: 08/19/1985  Age: 37 y.o. MRN: [id number]  CC: Chief Complaint  Patient presents with   migraines    Referral needed   Leg Pain   fluctuating blood sugars   Depression    Back on meds    HPI:  37 year old female presents for follow up.  Doing fairly well s/p gastric sleeve.  Having migraines. Insurance has been a barrier regarding her medications. Requesting referral to neurology.  Wants to discuss starting medication for anxiety and depression. Previously has been on Prozac and Wellbutrin.   Continues to have pain in the lower extremities.    Patient Active Problem List   Diagnosis Date Noted   Chronic pain 04/08/2023   IBS (irritable bowel syndrome) 04/05/2023   S/P laparoscopic sleeve gastrectomy 12/25/2022   Other hyperlipidemia 05/08/2022   Asthma 04/19/2022   Anxiety with depression 11/10/2020   OSA on CPAP 05/22/2020   Vitamin D deficiency 01/06/2020   Gastroesophageal reflux disease 01/06/2020   Migraine 10/01/2019   Morbid obesity (HCC) 10/01/2019    Social Hx   Social History   Socioeconomic History   Marital status: Significant Other    Spouse name: Not on file   Number of children: Not on file   Years of education: Not on file   Highest education level: Some college, no degree  Occupational History   Occupation: Company secretary  Tobacco Use   Smoking status: Former    Types: E-cigarettes   Smokeless tobacco: Never  Vaping Use   Vaping status: Every Day   Substances: Nicotine, CBD, Flavoring  Substance and Sexual Activity   Alcohol use: Yes    Comment: occ   Drug use: Not Currently    Types: Other-see comments   Sexual activity: Yes    Birth control/protection: None    Comment: has female partner  Other Topics Concern   Not on file  Social History Narrative   Lives with girlfriend [name]      Enjoys: likes to travel, not very outdoorsy, did like sports, close with family       Diet: eats all food-snacks alot   Caffeine: green tea, detox tea, rare soda, some coffee at [employer]: 3-4 bottles daily      Wears seat belt   Does not use phone while driving   Smoke detectors at home    Right handed   Social Determinants of Health   Financial Resource Strain: High Risk (09/25/2022)   Overall Financial Resource Strain (CARDIA)    Difficulty of Paying Living Expenses: Hard  Food Insecurity: Food Insecurity Present (12/26/2022)   Hunger Vital Sign    Worried About Running Out of Food in the Last Year: Often true    Ran Out of Food in the Last Year: Sometimes true  Transportation Needs: No Transportation Needs (12/26/2022)   PRAPARE - Administrator, Civil Service (Medical): No    Lack of Transportation (Non-Medical): No  Physical Activity: Insufficiently Active (09/25/2022)   Exercise Vital Sign    Days of Exercise per Week: 2 days    Minutes of Exercise per Session: 40 min  Stress: Stress Concern Present (09/25/2022)   [employer] of Occupational Health - Occupational Stress Questionnaire    Feeling of Stress : To some extent  Social Connections: Unknown (09/25/2022)   Social Connection and Isolation Panel [NHANES]    Frequency of Communication with Friends and  Family: Once a week    Frequency of Social Gatherings with Friends and Family: Once a week    Attends Religious Services: Patient declined    Database administrator or Organizations: No    Attends Engineer, structural: Not on file    Marital Status: Patient declined    Review of Systems Per HPI  Objective:  BP 100/62   Pulse 70   Temp (!) 97.2 F (36.2 C)   Ht 5\' 2"  (1.575 m)   Wt 297 lb (134.7 kg)   LMP 03/05/2023   SpO2 99%   BMI 54.32 kg/m      04/05/2023    1:07 PM 04/04/2023    2:18 PM 04/03/2023   11:37 AM  BP/Weight  Systolic BP 100 107   Diastolic BP 62 81   Wt. (Lbs) 297 297.7 296.7  BMI 54.32 kg/m2 54.45 kg/m2 54.27 kg/m2    Physical  Exam Vitals and nursing note reviewed.  Constitutional:      Appearance: Normal appearance.  HENT:     Head: Normocephalic and atraumatic.  Cardiovascular:     Rate and Rhythm: Normal rate and regular rhythm.  Pulmonary:     Effort: Pulmonary effort is normal.     Breath sounds: Normal breath sounds.  Neurological:     Mental Status: She is alert.  Psychiatric:     Comments: Flat affect.     Lab Results  Component Value Date   WBC 6.7 01/03/2023   HGB 13.5 01/03/2023   HCT 40.8 01/03/2023   PLT 327 01/03/2023   GLUCOSE 94 01/03/2023   CHOL 137 03/16/2022   TRIG 107 03/16/2022   HDL 33 (L) 03/16/2022   LDLCALC 84 03/16/2022   ALT 17 01/03/2023   AST 17 01/03/2023   NA 135 01/03/2023   K 4.0 01/03/2023   CL 101 01/03/2023   CREATININE 0.89 01/03/2023   BUN 9 01/03/2023   CO2 22 01/03/2023   TSH 3.340 08/15/2021   HGBA1C 5.1 12/12/2022     Assessment & Plan:   Problem List Items Addressed This Visit       Cardiovascular and Mediastinum   Migraine    Uncontrolled. Referring to Neurology.      Relevant Medications   DULoxetine (CYMBALTA) 30 MG capsule   Other Relevant Orders   Ambulatory referral to Neurology     Other   Chronic pain    Starting Cymbalta.      Relevant Medications   DULoxetine (CYMBALTA) 30 MG capsule   Anxiety with depression - Primary    Starting Cymbalta (hopefully with help with pain as well).      Relevant Medications   DULoxetine (CYMBALTA) 30 MG capsule    Meds ordered this encounter  Medications   DULoxetine (CYMBALTA) 30 MG capsule    Sig: 30 mg once daily for 1 to 2 weeks, then increase to 60 mg once daily as tolerated.    Dispense:  90 capsule    Refill:  1    Follow-up:  Return in about 3 months (around 07/06/2023).  Everlene Other DO [facility]

## 2023-04-08 NOTE — Assessment & Plan Note (Signed)
Starting Cymbalta 

## 2023-04-08 NOTE — Assessment & Plan Note (Signed)
Uncontrolled. Referring to Neurology.

## 2023-04-10 ENCOUNTER — Other Ambulatory Visit (INDEPENDENT_AMBULATORY_CARE_PROVIDER_SITE_OTHER): Payer: Self-pay | Admitting: Family Medicine

## 2023-04-10 ENCOUNTER — Other Ambulatory Visit: Payer: Self-pay | Admitting: Internal Medicine

## 2023-04-10 DIAGNOSIS — E782 Mixed hyperlipidemia: Secondary | ICD-10-CM

## 2023-04-10 DIAGNOSIS — E559 Vitamin D deficiency, unspecified: Secondary | ICD-10-CM

## 2023-04-16 ENCOUNTER — Encounter: Payer: Self-pay | Admitting: Internal Medicine

## 2023-04-16 NOTE — Telephone Encounter (Signed)
Pt wants a formal Rx for Linzess sent to her pharmacy

## 2023-04-18 ENCOUNTER — Other Ambulatory Visit (INDEPENDENT_AMBULATORY_CARE_PROVIDER_SITE_OTHER): Payer: Self-pay | Admitting: Family Medicine

## 2023-04-18 DIAGNOSIS — E559 Vitamin D deficiency, unspecified: Secondary | ICD-10-CM

## 2023-04-24 ENCOUNTER — Ambulatory Visit: Payer: 59 | Admitting: Dietician

## 2023-04-25 ENCOUNTER — Ambulatory Visit: Payer: 59 | Admitting: Skilled Nursing Facility1

## 2023-05-15 ENCOUNTER — Other Ambulatory Visit: Payer: Self-pay | Admitting: Internal Medicine

## 2023-05-15 MED ORDER — LINACLOTIDE 290 MCG PO CAPS: 290.0000 ug | ORAL_CAPSULE | Freq: Every day | ORAL | 3 refills | Status: AC

## 2023-05-23 ENCOUNTER — Other Ambulatory Visit (INDEPENDENT_AMBULATORY_CARE_PROVIDER_SITE_OTHER): Payer: Self-pay | Admitting: Family Medicine

## 2023-05-23 DIAGNOSIS — E559 Vitamin D deficiency, unspecified: Secondary | ICD-10-CM

## 2023-05-24 ENCOUNTER — Other Ambulatory Visit (INDEPENDENT_AMBULATORY_CARE_PROVIDER_SITE_OTHER): Payer: Self-pay | Admitting: Family Medicine

## 2023-05-24 DIAGNOSIS — E559 Vitamin D deficiency, unspecified: Secondary | ICD-10-CM

## 2023-05-30 ENCOUNTER — Encounter: Payer: Self-pay | Admitting: Family Medicine

## 2023-06-01 ENCOUNTER — Other Ambulatory Visit: Payer: Self-pay | Admitting: Family Medicine

## 2023-06-01 MED ORDER — FLUCONAZOLE 150 MG PO TABS: 150.0000 mg | ORAL_TABLET | Freq: Once | ORAL | 0 refills | Status: DC

## 2023-06-11 ENCOUNTER — Telehealth: Payer: Self-pay

## 2023-06-11 NOTE — Telephone Encounter (Signed)
Error

## 2023-06-15 ENCOUNTER — Telehealth: Payer: Self-pay

## 2023-06-15 ENCOUNTER — Encounter: Payer: Self-pay | Admitting: Family Medicine

## 2023-06-15 ENCOUNTER — Other Ambulatory Visit: Payer: Self-pay | Admitting: Family Medicine

## 2023-06-15 NOTE — Telephone Encounter (Signed)
See my chart message

## 2023-06-15 NOTE — Telephone Encounter (Signed)
Reason for CRM: She  needs an updated letter stating she is permanently disable. Please fax today to [phone].   Please advise

## 2023-06-22 ENCOUNTER — Ambulatory Visit (HOSPITAL_COMMUNITY)
Admission: RE | Admit: 2023-06-22 | Discharge: 2023-06-22 | Disposition: A | Discharge status: Home / Self Care | Payer: 59 | Source: Ambulatory Visit | Attending: Family Medicine | Admitting: Family Medicine

## 2023-06-22 ENCOUNTER — Ambulatory Visit: Payer: 59 | Admitting: Family Medicine

## 2023-06-22 VITALS — BP 103/72 | HR 82 | Temp 97.3°F | Ht 62.0 in | Wt 281.0 lb

## 2023-06-22 DIAGNOSIS — M79662 Pain in left lower leg: Secondary | ICD-10-CM

## 2023-06-22 DIAGNOSIS — H6993 Unspecified Eustachian tube disorder, bilateral: Secondary | ICD-10-CM | POA: Diagnosis not present

## 2023-06-22 MED ORDER — TRAMADOL HCL 50 MG PO TABS: 50.0000 mg | ORAL_TABLET | Freq: Three times a day (TID) | ORAL | 0 refills | Status: DC | PRN

## 2023-06-22 NOTE — Patient Instructions (Signed)
Xray at the hospital.  ENT referral placed.  Medication as directed for pain.

## 2023-06-25 ENCOUNTER — Encounter: Payer: Self-pay | Admitting: Family Medicine

## 2023-06-25 DIAGNOSIS — H6983 Other specified disorders of Eustachian tube, bilateral: Secondary | ICD-10-CM | POA: Insufficient documentation

## 2023-06-25 DIAGNOSIS — M79662 Pain in left lower leg: Secondary | ICD-10-CM | POA: Insufficient documentation

## 2023-06-25 DIAGNOSIS — H6993 Unspecified Eustachian tube disorder, bilateral: Secondary | ICD-10-CM | POA: Insufficient documentation

## 2023-06-25 NOTE — Progress Notes (Signed)
Subjective:  Patient ID: Jacqueline Collins, female    DOB: 1985/11/14  Age: 37 y.o. MRN: [id number]  CC:   Chief Complaint  Patient presents with   pain in legs left knot - pain shoots up toward knee   ear pressure and fluid feeling    HPI:  37 year old female with morbid obesity status post bariatric surgery presents for evaluation of the above.  Patient states that she has a discrete area of pain to the anterior left lower leg.  She states that it feels like a knot at the area.  She states that it causes significant pain particular at night.  Interferes with her ability to sleep.  She states that it seems to improve when she moves about.  She feels like the pain shoots upward as well.  Patient also reports ongoing pressure sensation in her ears.  She states that she feels like there is fluid in her ears.  She has had no relief with her home allergy medication.  She is requesting to see ear nose and throat.  Patient Active Problem List   Diagnosis Date Noted   Pain in left lower leg 06/25/2023   Dysfunction of both eustachian tubes 06/25/2023   Chronic pain 04/08/2023   IBS (irritable bowel syndrome) 04/05/2023   S/P laparoscopic sleeve gastrectomy 12/25/2022   Other hyperlipidemia 05/08/2022   Asthma 04/19/2022   Anxiety with depression 11/10/2020   OSA on CPAP 05/22/2020   Vitamin D deficiency 01/06/2020   Gastroesophageal reflux disease 01/06/2020   Migraine 10/01/2019   Morbid obesity (HCC) 10/01/2019    Social Hx   Social History   Socioeconomic History   Marital status: Significant Other    Spouse name: Not on file   Number of children: Not on file   Years of education: Not on file   Highest education level: Some college, no degree  Occupational History   Occupation: Company secretary  Tobacco Use   Smoking status: Former    Types: E-cigarettes   Smokeless tobacco: Never  Vaping Use   Vaping status: Every Day   Substances: Nicotine, CBD, Flavoring  Substance  and Sexual Activity   Alcohol use: Yes    Comment: occ   Drug use: Not Currently    Types: Other-see comments   Sexual activity: Yes    Birth control/protection: None    Comment: has female partner  Other Topics Concern   Not on file  Social History Narrative   Lives with girlfriend [name]      Enjoys: likes to travel, not very outdoorsy, did like sports, close with family      Diet: eats all food-snacks alot   Caffeine: green tea, detox tea, rare soda, some coffee at [employer]: 3-4 bottles daily      Wears seat belt   Does not use phone while driving   Smoke detectors at home    Right handed   Social Drivers of Health   Financial Resource Strain: High Risk (09/25/2022)   Overall Financial Resource Strain (CARDIA)    Difficulty of Paying Living Expenses: Hard  Food Insecurity: Food Insecurity Present (12/26/2022)   Hunger Vital Sign    Worried About Running Out of Food in the Last Year: Often true    Ran Out of Food in the Last Year: Sometimes true  Transportation Needs: No Transportation Needs (12/26/2022)   PRAPARE - Administrator, Civil Service (Medical): No    Lack of Transportation (Non-Medical):  No  Physical Activity: Insufficiently Active (09/25/2022)   Exercise Vital Sign    Days of Exercise per Week: 2 days    Minutes of Exercise per Session: 40 min  Stress: Stress Concern Present (09/25/2022)   [employer] of Occupational Health - Occupational Stress Questionnaire    Feeling of Stress : To some extent  Social Connections: Unknown (09/25/2022)   Social Connection and Isolation Panel [NHANES]    Frequency of Communication with Friends and Family: Once a week    Frequency of Social Gatherings with Friends and Family: Once a week    Attends Religious Services: Patient declined    Database administrator or Organizations: No    Attends Engineer, structural: Not on file    Marital Status: Patient declined    Review of  Systems Per HPI  Objective:  BP 103/72   Pulse 82   Temp (!) 97.3 F (36.3 C)   Ht 5\' 2"  (1.575 m)   Wt 281 lb (127.5 kg)   BMI 51.40 kg/m      06/22/2023    3:21 PM 04/05/2023    1:07 PM 04/04/2023    2:18 PM  BP/Weight  Systolic BP 103 100 107  Diastolic BP 72 62 81  Wt. (Lbs) 281 297 297.7  BMI 51.4 kg/m2 54.32 kg/m2 54.45 kg/m2    Physical Exam Vitals and nursing note reviewed.  Constitutional:      General: She is not in acute distress.    Appearance: She is obese.  HENT:     Head: Normocephalic and atraumatic.     Right Ear: Tympanic membrane normal.     Left Ear: Tympanic membrane normal.  Pulmonary:     Effort: Pulmonary effort is normal. No respiratory distress.  Musculoskeletal:     Comments: Left lower leg with a palpable slightly raised area over the anterior tibia.  Tender to palpation.  Neurological:     Mental Status: She is alert.     Lab Results  Component Value Date   WBC 6.7 01/03/2023   HGB 13.5 01/03/2023   HCT 40.8 01/03/2023   PLT 327 01/03/2023   GLUCOSE 94 01/03/2023   CHOL 137 03/16/2022   TRIG 107 03/16/2022   HDL 33 (L) 03/16/2022   LDLCALC 84 03/16/2022   ALT 17 01/03/2023   AST 17 01/03/2023   NA 135 01/03/2023   K 4.0 01/03/2023   CL 101 01/03/2023   CREATININE 0.89 01/03/2023   BUN 9 01/03/2023   CO2 22 01/03/2023   TSH 3.340 08/15/2021   HGBA1C 5.1 12/12/2022     Assessment & Plan:   Problem List Items Addressed This Visit       Nervous and Auditory   Dysfunction of both eustachian tubes   Exam benign.  Referring to ENT as requested.      Relevant Orders   Ambulatory referral to ENT     Other   Pain in left lower leg - Primary   X-ray was obtained of the left lower extremity.  X-ray was independently interpreted by me.  Interpretation: Normal x-ray.  No evidence of acute abnormality.  Will recommend dedicated ultrasound over the area of concern.  Tramadol as needed for pain.  Cannot use NSAIDs in the  setting of bariatric surgery.      Relevant Orders   DG Tibia/Fibula Left (Completed)    Meds ordered this encounter  Medications   traMADol (ULTRAM) 50 MG tablet    Sig:  Take 1 tablet (50 mg total) by mouth every 8 (eight) hours as needed for up to 5 days for severe pain (pain score 7-10) or moderate pain (pain score 4-6).    Dispense:  15 tablet    Refill:  0    Jacqueline Wiederholt DO [facility]

## 2023-06-25 NOTE — Assessment & Plan Note (Signed)
Exam benign.  Referring to ENT as requested.

## 2023-06-25 NOTE — Assessment & Plan Note (Signed)
X-ray was obtained of the left lower extremity.  X-ray was independently interpreted by me.  Interpretation: Normal x-ray.  No evidence of acute abnormality.  Will recommend dedicated ultrasound over the area of concern.  Tramadol as needed for pain.  Cannot use NSAIDs in the setting of bariatric surgery.

## 2023-06-28 ENCOUNTER — Other Ambulatory Visit: Payer: Self-pay | Admitting: Family Medicine

## 2023-06-28 DIAGNOSIS — M79662 Pain in left lower leg: Secondary | ICD-10-CM

## 2023-06-29 NOTE — Telephone Encounter (Signed)
Tommie Sams, DO     06/28/23  7:27 PM Order is in.

## 2023-07-03 ENCOUNTER — Ambulatory Visit (HOSPITAL_COMMUNITY)
Admission: RE | Admit: 2023-07-03 | Discharge: 2023-07-03 | Disposition: A | Discharge status: Home / Self Care | Payer: 59 | Source: Ambulatory Visit | Attending: Family Medicine | Admitting: Family Medicine

## 2023-07-03 DIAGNOSIS — M79662 Pain in left lower leg: Secondary | ICD-10-CM | POA: Diagnosis present

## 2023-07-06 ENCOUNTER — Telehealth: Payer: 59 | Admitting: Family Medicine

## 2023-07-06 ENCOUNTER — Other Ambulatory Visit: Payer: Self-pay | Admitting: Family Medicine

## 2023-07-06 DIAGNOSIS — J111 Influenza due to unidentified influenza virus with other respiratory manifestations: Secondary | ICD-10-CM | POA: Diagnosis not present

## 2023-07-06 MED ORDER — OSELTAMIVIR PHOSPHATE 75 MG PO CAPS: 75.0000 mg | ORAL_CAPSULE | Freq: Two times a day (BID) | ORAL | 0 refills | Status: AC

## 2023-07-06 NOTE — Progress Notes (Signed)

## 2023-07-07 ENCOUNTER — Other Ambulatory Visit: Payer: Self-pay | Admitting: Family Medicine

## 2023-07-09 ENCOUNTER — Ambulatory Visit: Payer: 59 | Admitting: Family Medicine

## 2023-07-09 ENCOUNTER — Ambulatory Visit: Payer: 59 | Admitting: Dietician

## 2023-07-10 ENCOUNTER — Telehealth: Payer: Self-pay

## 2023-07-10 NOTE — Telephone Encounter (Signed)
 Left message for patient to return the call for additional details and recommendations about ultrasound results

## 2023-07-16 ENCOUNTER — Ambulatory Visit: Payer: 59 | Admitting: Family Medicine

## 2023-07-16 DIAGNOSIS — J209 Acute bronchitis, unspecified: Secondary | ICD-10-CM | POA: Diagnosis not present

## 2023-07-16 DIAGNOSIS — N3281 Overactive bladder: Secondary | ICD-10-CM | POA: Insufficient documentation

## 2023-07-16 DIAGNOSIS — N62 Hypertrophy of breast: Secondary | ICD-10-CM | POA: Diagnosis not present

## 2023-07-16 DIAGNOSIS — Z9884 Bariatric surgery status: Secondary | ICD-10-CM | POA: Diagnosis not present

## 2023-07-16 DIAGNOSIS — R42 Dizziness and giddiness: Secondary | ICD-10-CM | POA: Insufficient documentation

## 2023-07-16 MED ORDER — FLUCONAZOLE 150 MG PO TABS: 150.0000 mg | ORAL_TABLET | Freq: Once | ORAL | 0 refills | Status: AC

## 2023-07-16 MED ORDER — MECLIZINE HCL 25 MG PO TABS: 25.0000 mg | ORAL_TABLET | Freq: Three times a day (TID) | ORAL | 0 refills | Status: DC | PRN

## 2023-07-16 MED ORDER — AMOXICILLIN-POT CLAVULANATE 875-125 MG PO TABS: 1.0000 | ORAL_TABLET | Freq: Two times a day (BID) | ORAL | 0 refills | Status: DC

## 2023-07-16 NOTE — Assessment & Plan Note (Signed)
Referring to plastic surgery.

## 2023-07-16 NOTE — Assessment & Plan Note (Signed)
 Labs today. PRN Meclizine. Avoid abrupt head movements.

## 2023-07-16 NOTE — Patient Instructions (Signed)
 Labs today.  Referral to plastic surgery placed.  Antibiotic and diflucan sent.  Meclizine as needed for dizziness.  Follow up in 3 months.

## 2023-07-16 NOTE — Progress Notes (Signed)
 Subjective:  Patient ID: Jacqueline Collins, female    DOB: Jan 10, 1986  Age: 38 y.o. MRN: [id number]  CC:   Chief Complaint  Patient presents with   Dizziness    Multiple episodes   Follow-up    Coughing up green mucus, runny nose    referral for breast reduction    HPI:  38 year old female presents for follow up.  Patient recently sick. Had E visit and was treated for concern for influenza with Tamiflu . She reports she has completed treatment. Having significant productive cough. No fever.  Patient states that she would like referral to plastic surgery for consultation for breast reduction surgery. I advised her that I would wait until she is further along in her weight loss journey before surgery.   She also reports recent lightheadedness and pre-syncope. Occurred recently with abrupt head movement.  Eating well. Needs labs (including those recommended post bariatric surgery).  Lastly, she reports that she often feels that she has to urinate quickly after urinating.   Patient Active Problem List   Diagnosis Date Noted   Acute bronchitis 07/16/2023   Lightheadedness 07/16/2023   Large breasts 07/16/2023   OAB (overactive bladder) 07/16/2023   Pain in left lower leg 06/25/2023   Chronic pain 04/08/2023   IBS (irritable bowel syndrome) 04/05/2023   S/P laparoscopic sleeve gastrectomy 12/25/2022   Other hyperlipidemia 05/08/2022   Asthma 04/19/2022   Anxiety with depression 11/10/2020   OSA on CPAP 05/22/2020   Vitamin D  deficiency 01/06/2020   Gastroesophageal reflux disease 01/06/2020   Migraine 10/01/2019   Morbid obesity (HCC) 10/01/2019    Social Hx   Social History   Socioeconomic History   Marital status: Significant Other    Spouse name: Not on file   Number of children: Not on file   Years of education: Not on file   Highest education level: Some college, no degree  Occupational History   Occupation: company secretary  Tobacco Use   Smoking status: Former     Types: E-cigarettes   Smokeless tobacco: Never  Vaping Use   Vaping status: Every Day   Substances: Nicotine, CBD, Flavoring  Substance and Sexual Activity   Alcohol use: Yes    Comment: occ   Drug use: Not Currently    Types: Other-see comments   Sexual activity: Yes    Birth control/protection: None    Comment: has female partner  Other Topics Concern   Not on file  Social History Narrative   Lives with girlfriend Esau      Enjoys: likes to travel, not very outdoorsy, did like sports, close with family      Diet: eats all food-snacks alot   Caffeine: green tea, detox tea, rare soda, some coffee at times-gatorade   Water : 3-4 bottles daily      Wears seat belt   Does not use phone while driving   Smoke detectors at home    Right handed   Social Drivers of Health   Financial Resource Strain: High Risk (09/25/2022)   Overall Financial Resource Strain (CARDIA)    Difficulty of Paying Living Expenses: Hard  Food Insecurity: Food Insecurity Present (12/26/2022)   Hunger Vital Sign    Worried About Running Out of Food in the Last Year: Often true    Ran Out of Food in the Last Year: Sometimes true  Transportation Needs: No Transportation Needs (12/26/2022)   PRAPARE - Administrator, Civil Service (Medical): No  Lack of Transportation (Non-Medical): No  Physical Activity: Insufficiently Active (09/25/2022)   Exercise Vital Sign    Days of Exercise per Week: 2 days    Minutes of Exercise per Session: 40 min  Stress: Stress Concern Present (09/25/2022)   [employer] of Occupational Health - Occupational Stress Questionnaire    Feeling of Stress : To some extent  Social Connections: Unknown (09/25/2022)   Social Connection and Isolation Panel [NHANES]    Frequency of Communication with Friends and Family: Once a week    Frequency of Social Gatherings with Friends and Family: Once a week    Attends Religious Services: Patient declined    Loss Adjuster, Chartered or Organizations: No    Attends Engineer, Structural: Not on file    Marital Status: Patient declined    Review of Systems Per HPI  Objective:  BP 114/78   Pulse 90   Temp 98.1 F (36.7 C)   Ht 5' 2 (1.575 m)   Wt 284 lb (128.8 kg)   SpO2 99%   BMI 51.94 kg/m      07/16/2023    2:46 PM 06/22/2023    3:21 PM 04/05/2023    1:07 PM  BP/Weight  Systolic BP 114 103 100  Diastolic BP 78 72 62  Wt. (Lbs) 284 281 297  BMI 51.94 kg/m2 51.4 kg/m2 54.32 kg/m2    Physical Exam Vitals and nursing note reviewed.  Constitutional:      General: She is not in acute distress.    Appearance: Normal appearance. She is obese.  HENT:     Head: Normocephalic and atraumatic.  Eyes:     General:        Right eye: No discharge.        Left eye: No discharge.     Conjunctiva/sclera: Conjunctivae normal.  Cardiovascular:     Rate and Rhythm: Normal rate and regular rhythm.  Pulmonary:     Effort: Pulmonary effort is normal.     Breath sounds: Normal breath sounds.  Neurological:     Mental Status: She is alert.  Psychiatric:        Mood and Affect: Mood normal.        Behavior: Behavior normal.     Lab Results  Component Value Date   WBC 6.7 01/03/2023   HGB 13.5 01/03/2023   HCT 40.8 01/03/2023   PLT 327 01/03/2023   GLUCOSE 94 01/03/2023   CHOL 137 03/16/2022   TRIG 107 03/16/2022   HDL 33 (L) 03/16/2022   LDLCALC 84 03/16/2022   ALT 17 01/03/2023   AST 17 01/03/2023   NA 135 01/03/2023   K 4.0 01/03/2023   CL 101 01/03/2023   CREATININE 0.89 01/03/2023   BUN 9 01/03/2023   CO2 22 01/03/2023   TSH 3.340 08/15/2021   HGBA1C 5.1 12/12/2022     Assessment & Plan:   Problem List Items Addressed This Visit       Respiratory   Acute bronchitis   Treating with Augmentin .         Genitourinary   OAB (overactive bladder)   Patient likely experiencing OAB. Patient inquiring about Lasix . Advised against this.   Supportive care at this time.          Other   S/P laparoscopic sleeve gastrectomy   Relevant Orders   CBC   CMP14+EGFR   Lipid panel   Hemoglobin A1c   Vitamin B12   Folate  Iron, TIBC and Ferritin Panel   TSH   Vitamin D , 25-hydroxy   Vitamin B1   Large breasts   Referring to plastic surgery.      Relevant Orders   Ambulatory referral to Plastic Surgery   Lightheadedness   Labs today. PRN Meclizine . Avoid abrupt head movements.       Meds ordered this encounter  Medications   amoxicillin -clavulanate (AUGMENTIN ) 875-125 MG tablet    Sig: Take 1 tablet by mouth 2 (two) times daily.    Dispense:  14 tablet    Refill:  0   fluconazole  (DIFLUCAN ) 150 MG tablet    Sig: Take 1 tablet (150 mg total) by mouth once for 1 dose. Repeat dose in 72 hours.    Dispense:  2 tablet    Refill:  0   meclizine  (ANTIVERT ) 25 MG tablet    Sig: Take 1 tablet (25 mg total) by mouth 3 (three) times daily as needed for dizziness.    Dispense:  30 tablet    Refill:  0    Follow-up:  Return in about 3 months (around 10/14/2023).  Jacqulyn Ahle DO [facility]

## 2023-07-16 NOTE — Assessment & Plan Note (Signed)
 Treating with Augmentin.

## 2023-07-16 NOTE — Assessment & Plan Note (Signed)
 Patient likely experiencing OAB. Patient inquiring about Lasix. Advised against this.   Supportive care at this time.

## 2023-07-17 ENCOUNTER — Encounter: Payer: 59 | Attending: General Surgery | Admitting: Dietician

## 2023-07-17 ENCOUNTER — Encounter: Payer: Self-pay | Admitting: Dietician

## 2023-07-17 VITALS — Ht 62.0 in | Wt 279.8 lb

## 2023-07-17 DIAGNOSIS — E669 Obesity, unspecified: Secondary | ICD-10-CM | POA: Diagnosis present

## 2023-07-17 NOTE — Progress Notes (Signed)
 Bariatric Nutrition Follow-Up Visit Medical Nutrition Therapy  Appt Start Time: 1433   End Time: 1504  Surgery date: 12/25/2022 Surgery type: Sleeve Gastrectomy  NUTRITION ASSESSMENT   Anthropometrics  Start weight at [facility]: 337.9 lbs (date: 08/24/2022) Height: 62 in Weight today: 279.8 lbs.   Clinical  Pharmacotherapy: History of weight loss medication used: Mounjaro   Medical hx: asthma, sleep apnea, obesity Medications: emgaity, vit D montelukast , albu  Labs: HDL 33;  Notable signs/symptoms: none noted Any previous deficiencies? No Bowel Habits: Every day to every other day no complaints   Body Composition Scale 01/09/2023 04/03/2023 07/17/2023  Current Body Weight 323.6 296.7 279.8  Total Body Fat % 51.1 49.5 48.3  Visceral Fat 21 19 18   Fat-Free Mass % 48.8 50.4 51.6   Total Body Water  % 38.9 39.7 40.3  Muscle-Mass lbs 31.9 31.6 31.4  BMI 59.1 54.2 51.1  Body Fat Displacement            Torso  lbs 102.7 91.2 83.8         Left Leg  lbs 20.5 18.2 16.7         Right Leg  lbs 20.5 18.2 16.7         Left Arm  lbs 10.2 9.1 8.3         Right Arm  lbs 10.2 9.1 8.3    Lifestyle & Dietary Hx  Pt states she can not afford to go to the gym right now Pt states she is looking to do a breast reduction. Pt states she is having pain/tingling/numbness while sitting too long. Pt states her PCP is trying to help her with that. Pt states she has a hard time eating multiple foods at a time, stating she gets too full quickly.  Estimated daily fluid intake: 48 oz Estimated daily protein intake: 60 g Supplements: multivitamin and calcium  Current average weekly physical activity: ADLs, walk the dog 5-10 minutes 4 days per week.   24-Hr Dietary Recall First Meal: egg whites and sausage Snack:  Second Meal: salad with grilled chicken Snack:  salmon dip with celery Third Meal: steak, turkey or chicken with broccoli (small amount of cheese) and fruit Snack:  Beverages: water , zero sugar  lemonade, ginger ale (since she was sick)  Post-Op Goals/ Signs/ Symptoms Using straws: yes Drinking while eating: no Chewing/swallowing difficulties: no Changes in vision: no Changes to mood/headaches: no Hair loss/changes to skin/nails: no Difficulty focusing/concentrating: no Sweating: no Limb weakness: no Dizziness/lightheadedness:  Palpitations: no  Carbonated/caffeinated beverages: no N/V/D/C/Gas: gas and constipation Abdominal pain: no Dumping syndrome: no   NUTRITION DIAGNOSIS  Overweight/obesity ([place]-3.3) related to past poor dietary habits and physical inactivity as evidenced by completed bariatric surgery and following dietary guidelines for continued weight loss and healthy nutrition status.   NUTRITION INTERVENTION Nutrition counseling (C-1) and education (E-2) to facilitate bariatric surgery goals, including: Diet advancement to the standard prep plan The importance of consuming adequate calories as well as certain nutrients daily due to the body's need for essential vitamins, minerals, and fats The importance of daily physical activity and to reach a goal of at least 150 minutes of moderate to vigorous physical activity weekly (or as directed by their physician) due to benefits such as increased musculature and improved lab values The importance of intuitive eating specifically learning hunger-satiety cues and understanding the importance of learning a new body: The importance of mindful eating to avoid grazing behaviors  The importance of getting a variety of food.  Different  foods contain different nutrients, so eating a variety helps you get the vitamins, minerals, and phytochemicals you need. Eating different foods can help you develop a more diverse gut microbiome, which can reduce allergies and obesity, and boost immunity. Protein: Incorporate lean sources of protein, such as poultry, fish, beans, nuts, and seeds, into your meals. Protein is essential for building and  repairing tissues, staying full, balancing blood sugar, as well as supporting immune function.  Goals Continue: chew well; slow down; aim for satisfaction instead of fullness; eat throughout the day to avoid sugar lows. Continue: try eating some non-starchy vegetables and/or complex carbohydrates before protein/meat to see if you can tolerate both. Continue: try to increase volume of food to decrease need for protein shakes. New: aim for a variety of food New: try plain greek yogurt or cottage cheese (blend to smooth) in your salmon dip  Handouts Provided Include  6 Months - Maintenance Healthy Eating Guide  Body Comp Scale readout  Learning Style & Readiness for Change Teaching method utilized: Visual & Auditory  Demonstrated degree of understanding via: Teach Back  Readiness Level: preparation Barriers to learning/adherence to lifestyle change: mobility (leg pain)  RD's Notes for Next Visit Assess adherence to pt chosen goals  MONITORING & EVALUATION Dietary intake, weekly physical activity, body weight.  Next Steps Patient is to follow-up in 3 months.

## 2023-07-18 ENCOUNTER — Other Ambulatory Visit: Payer: Self-pay | Admitting: Family Medicine

## 2023-07-22 ENCOUNTER — Encounter: Payer: Self-pay | Admitting: Family Medicine

## 2023-07-22 LAB — TSH: TSH: 2.22 u[IU]/mL (ref 0.450–4.500)

## 2023-07-22 LAB — CMP14+EGFR
ALT: 8 [IU]/L (ref 0–32)
AST: 14 [IU]/L (ref 0–40)
Albumin: 4 g/dL (ref 3.9–4.9)
Alkaline Phosphatase: 82 [IU]/L (ref 44–121)
BUN/Creatinine Ratio: 10 (ref 9–23)
BUN: 8 mg/dL (ref 6–20)
Bilirubin Total: 0.3 mg/dL (ref 0.0–1.2)
CO2: 23 mmol/L (ref 20–29)
Calcium: 9.4 mg/dL (ref 8.7–10.2)
Chloride: 102 mmol/L (ref 96–106)
Creatinine, Ser: 0.82 mg/dL (ref 0.57–1.00)
Globulin, Total: 2.7 g/dL (ref 1.5–4.5)
Glucose: 90 mg/dL (ref 70–99)
Potassium: 4.3 mmol/L (ref 3.5–5.2)
Sodium: 139 mmol/L (ref 134–144)
Total Protein: 6.7 g/dL (ref 6.0–8.5)
eGFR: 95 mL/min/{1.73_m2} (ref >59)

## 2023-07-22 LAB — IRON,TIBC AND FERRITIN PANEL
Ferritin: 95 ng/mL (ref 15–150)
Iron Saturation: 22 % (ref 15–55)
Iron: 74 ug/dL (ref 27–159)
Total Iron Binding Capacity: 330 ug/dL (ref 250–450)
UIBC: 256 ug/dL (ref 131–425)

## 2023-07-22 LAB — FOLATE: Folate: 12.6 ng/mL (ref >3.0)

## 2023-07-22 LAB — VITAMIN D 25 HYDROXY (VIT D DEFICIENCY, FRACTURES): Vit D, 25-Hydroxy: 42.2 ng/mL (ref 30.0–100.0)

## 2023-07-22 LAB — HEMOGLOBIN A1C
Est. average glucose Bld gHb Est-mCnc: 103 mg/dL
Hgb A1c MFr Bld: 5.2 % (ref 4.8–5.6)

## 2023-07-22 LAB — CBC
Hematocrit: 38.8 % (ref 34.0–46.6)
Hemoglobin: 13 g/dL (ref 11.1–15.9)
MCH: 32.6 pg (ref 26.6–33.0)
MCHC: 33.5 g/dL (ref 31.5–35.7)
MCV: 97 fL (ref 79–97)
Platelets: 375 10*3/uL (ref 150–450)
RBC: 3.99 x10E6/uL (ref 3.77–5.28)
RDW: 12.9 % (ref 11.7–15.4)
WBC: 9.5 10*3/uL (ref 3.4–10.8)

## 2023-07-22 LAB — VITAMIN B12: Vitamin B-12: 558 pg/mL (ref 232–1245)

## 2023-07-22 LAB — LIPID PANEL
Chol/HDL Ratio: 4.6 {ratio} — ABNORMAL HIGH (ref 0.0–4.4)
Cholesterol, Total: 197 mg/dL (ref 100–199)
HDL: 43 mg/dL (ref >39)
LDL Chol Calc (NIH): 128 mg/dL — ABNORMAL HIGH (ref 0–99)
Triglycerides: 147 mg/dL (ref 0–149)
VLDL Cholesterol Cal: 26 mg/dL (ref 5–40)

## 2023-07-22 LAB — VITAMIN B1: Thiamine: 124 nmol/L (ref 66.5–200.0)

## 2023-07-23 ENCOUNTER — Telehealth: Payer: Self-pay

## 2023-07-23 NOTE — Telephone Encounter (Signed)
Left message for patient to return the call for additional details and recommendations regarding lab results

## 2023-08-01 ENCOUNTER — Telehealth (INDEPENDENT_AMBULATORY_CARE_PROVIDER_SITE_OTHER): Payer: Self-pay | Admitting: Otolaryngology

## 2023-08-01 NOTE — Telephone Encounter (Signed)
confirmed appt & location [id number] [facility]

## 2023-08-02 ENCOUNTER — Ambulatory Visit (INDEPENDENT_AMBULATORY_CARE_PROVIDER_SITE_OTHER): Payer: 59 | Admitting: Otolaryngology

## 2023-08-02 ENCOUNTER — Encounter (INDEPENDENT_AMBULATORY_CARE_PROVIDER_SITE_OTHER): Payer: Self-pay

## 2023-08-02 VITALS — BP 122/85 | HR 85 | Ht 62.0 in | Wt 277.0 lb

## 2023-08-02 DIAGNOSIS — J310 Chronic rhinitis: Secondary | ICD-10-CM

## 2023-08-02 DIAGNOSIS — R0981 Nasal congestion: Secondary | ICD-10-CM | POA: Diagnosis not present

## 2023-08-02 DIAGNOSIS — H6983 Other specified disorders of Eustachian tube, bilateral: Secondary | ICD-10-CM

## 2023-08-03 ENCOUNTER — Other Ambulatory Visit: Payer: Self-pay | Admitting: Family Medicine

## 2023-08-03 DIAGNOSIS — J310 Chronic rhinitis: Secondary | ICD-10-CM | POA: Insufficient documentation
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
## 2023-08-05 ENCOUNTER — Other Ambulatory Visit: Payer: Self-pay | Admitting: Family Medicine

## 2023-08-08 ENCOUNTER — Institutional Professional Consult (permissible substitution): Payer: 59 | Admitting: Plastic Surgery

## 2023-08-09 ENCOUNTER — Telehealth (INDEPENDENT_AMBULATORY_CARE_PROVIDER_SITE_OTHER): Payer: Self-pay | Admitting: Otolaryngology

## 2023-08-09 NOTE — Telephone Encounter (Signed)
 Called patient to reschedule appointment, left message in mychart to reschedule

## 2023-08-27 ENCOUNTER — Other Ambulatory Visit: Payer: Self-pay | Admitting: Internal Medicine

## 2023-08-27 ENCOUNTER — Other Ambulatory Visit (INDEPENDENT_AMBULATORY_CARE_PROVIDER_SITE_OTHER): Payer: Self-pay | Admitting: Family Medicine

## 2023-08-27 ENCOUNTER — Other Ambulatory Visit: Payer: Self-pay | Admitting: Family Medicine

## 2023-08-27 DIAGNOSIS — E559 Vitamin D deficiency, unspecified: Secondary | ICD-10-CM

## 2023-08-27 DIAGNOSIS — J329 Chronic sinusitis, unspecified: Secondary | ICD-10-CM

## 2023-09-11 ENCOUNTER — Telehealth (INDEPENDENT_AMBULATORY_CARE_PROVIDER_SITE_OTHER): Payer: Self-pay | Admitting: Otolaryngology

## 2023-09-11 NOTE — Telephone Encounter (Signed)
 Left voicemail with appointment information and office address.

## 2023-09-13 ENCOUNTER — Ambulatory Visit (INDEPENDENT_AMBULATORY_CARE_PROVIDER_SITE_OTHER): Payer: 59

## 2023-09-18 ENCOUNTER — Institutional Professional Consult (permissible substitution): Payer: 59 | Admitting: Plastic Surgery

## 2023-09-18 ENCOUNTER — Telehealth: Payer: Self-pay | Admitting: Plastic Surgery

## 2023-09-18 NOTE — Telephone Encounter (Signed)
 Pt said she called yesterday to cancel apt for today, we have no missed calls the only tele message is to Dr Luther Hearing office. I let pt know that canceling same day could result in a no show fee of 50.00.

## 2023-10-01 ENCOUNTER — Other Ambulatory Visit: Payer: Self-pay | Admitting: Family Medicine

## 2023-10-15 ENCOUNTER — Ambulatory Visit: Payer: 59 | Admitting: Family Medicine

## 2023-10-15 ENCOUNTER — Ambulatory Visit (HOSPITAL_COMMUNITY)
Admission: RE | Admit: 2023-10-15 | Discharge: 2023-10-15 | Disposition: A | Discharge status: Home / Self Care | Source: Ambulatory Visit | Attending: Family Medicine | Admitting: Family Medicine

## 2023-10-15 VITALS — BP 90/65 | HR 63 | Temp 97.3°F | Ht 62.0 in | Wt 269.0 lb

## 2023-10-15 DIAGNOSIS — M79605 Pain in left leg: Secondary | ICD-10-CM | POA: Insufficient documentation

## 2023-10-15 DIAGNOSIS — M542 Cervicalgia: Secondary | ICD-10-CM | POA: Insufficient documentation

## 2023-10-15 NOTE — Patient Instructions (Addendum)
 Referral to PT placed.  Xrays at the hospital.  Follow up in 3-6 months.

## 2023-10-16 ENCOUNTER — Encounter: Payer: Self-pay | Admitting: Family Medicine

## 2023-10-16 ENCOUNTER — Ambulatory Visit: Payer: 59 | Admitting: Dietician

## 2023-10-16 DIAGNOSIS — M542 Cervicalgia: Secondary | ICD-10-CM | POA: Insufficient documentation

## 2023-10-16 DIAGNOSIS — M79605 Pain in left leg: Secondary | ICD-10-CM | POA: Insufficient documentation

## 2023-10-16 NOTE — Assessment & Plan Note (Signed)
Xray today for evaluation.

## 2023-10-16 NOTE — Assessment & Plan Note (Signed)
 Xray of the lumbar spine to assess for possible radiculopathy.  Referring to PT.

## 2023-10-16 NOTE — Progress Notes (Signed)
 Subjective:  Patient ID: Jacqueline Collins, female    DOB: [birth year]  Age: 38 y.o. MRN: [id number]  CC:   Chief Complaint  Patient presents with   Neck Pain    Bilateral tingling and numbness in arms and fingers - referral to PT    left leg pain     Severe pain follow up    HPI:  38 year old female presents for follow-up.  Patient has had significant weight loss following bariatric surgery.  She is down 100 pounds.  Patient reports that she continues to have pain in her left lower leg.  Prior x-ray has been unrevealing.  Patient also reports that she has neck pain which seems to be chronic as well.  She states that she has numbness and tingling in her extremities.  Has known chronic pain.  Patient interested in physical therapy.  Patient Active Problem List   Diagnosis Date Noted   Left leg pain 10/16/2023   Neck pain 10/16/2023   Chronic rhinitis 08/03/2023   Large breasts 07/16/2023   OAB (overactive bladder) 07/16/2023   Pain in left lower leg 06/25/2023   Chronic pain 04/08/2023   IBS (irritable bowel syndrome) 04/05/2023   S/P laparoscopic sleeve gastrectomy 12/25/2022   Other hyperlipidemia 05/08/2022   Asthma 04/19/2022   Anxiety with depression 11/10/2020   OSA on CPAP 05/22/2020   Vitamin D deficiency 01/06/2020   Gastroesophageal reflux disease 01/06/2020   Migraine 10/01/2019   Morbid obesity (HCC) 10/01/2019    Social Hx   Social History   Socioeconomic History   Marital status: Significant Other    Spouse name: Not on file   Number of children: Not on file   Years of education: Not on file   Highest education level: Some college, no degree  Occupational History   Occupation: Company secretary  Tobacco Use   Smoking status: Former    Types: E-cigarettes   Smokeless tobacco: Never  Vaping Use   Vaping status: Every Day   Substances: Nicotine, CBD, Flavoring  Substance and Sexual Activity   Alcohol use: Yes    Comment: occ   Drug use: Not Currently     Types: Other-see comments   Sexual activity: Yes    Birth control/protection: None    Comment: has female partner  Other Topics Concern   Not on file  Social History Narrative   Lives with girlfriend [name]      Enjoys: likes to travel, not very outdoorsy, did like sports, close with family      Diet: eats all food-snacks alot   Caffeine: green tea, detox tea, rare soda, some coffee at [employer]: 3-4 bottles daily      Wears seat belt   Does not use phone while driving   Smoke detectors at home    Right handed   Social Drivers of Health   Financial Resource Strain: High Risk (09/25/2022)   Overall Financial Resource Strain (CARDIA)    Difficulty of Paying Living Expenses: Hard  Food Insecurity: Food Insecurity Present (12/26/2022)   Hunger Vital Sign    Worried About Running Out of Food in the Last Year: Often true    Ran Out of Food in the Last Year: Sometimes true  Transportation Needs: No Transportation Needs (12/26/2022)   PRAPARE - Administrator, Civil Service (Medical): No    Lack of Transportation (Non-Medical): No  Physical Activity: Insufficiently Active (09/25/2022)   Exercise Vital Sign    Days  of Exercise per Week: 2 days    Minutes of Exercise per Session: 40 min  Stress: Stress Concern Present (09/25/2022)   [employer] of Occupational Health - Occupational Stress Questionnaire    Feeling of Stress : To some extent  Social Connections: Unknown (09/25/2022)   Social Connection and Isolation Panel [NHANES]    Frequency of Communication with Friends and Family: Once a week    Frequency of Social Gatherings with Friends and Family: Once a week    Attends Religious Services: Patient declined    Database administrator or Organizations: No    Attends Engineer, structural: Not on file    Marital Status: Patient declined    Review of Systems Per HPI  Objective:  BP 90/65   Pulse 63   Temp (!) 97.3 F (36.3 C)   Ht  5\' 2"  (1.575 m)   Wt 269 lb (122 kg)   SpO2 99%   BMI 49.20 kg/m      10/15/2023    2:12 PM 08/02/2023    3:33 PM 07/17/2023    3:03 PM  BP/Weight  Systolic BP 90 122   Diastolic BP 65 85   Wt. (Lbs) 269 277 279.8  BMI 49.2 kg/m2 50.66 kg/m2 51.18 kg/m2    Physical Exam Vitals and nursing note reviewed.  Constitutional:      General: She is not in acute distress.    Appearance: Normal appearance. She is obese.  HENT:     Head: Normocephalic and atraumatic.  Cardiovascular:     Rate and Rhythm: Normal rate and regular rhythm.  Pulmonary:     Effort: Pulmonary effort is normal.     Breath sounds: Normal breath sounds. No wheezing, rhonchi or rales.  Neurological:     Mental Status: She is alert.     Lab Results  Component Value Date   WBC 9.5 07/16/2023   HGB 13.0 07/16/2023   HCT 38.8 07/16/2023   PLT 375 07/16/2023   GLUCOSE 90 07/16/2023   CHOL 197 07/16/2023   TRIG 147 07/16/2023   HDL 43 07/16/2023   LDLCALC 128 (H) 07/16/2023   ALT 8 07/16/2023   AST 14 07/16/2023   NA 139 07/16/2023   K 4.3 07/16/2023   CL 102 07/16/2023   CREATININE 0.82 07/16/2023   BUN 8 07/16/2023   CO2 23 07/16/2023   TSH 2.220 07/16/2023   HGBA1C 5.2 07/16/2023     Assessment & Plan:  Left leg pain Assessment & Plan: Xray of the lumbar spine to assess for possible radiculopathy.  Referring to PT.  Orders: -     DG Lumbar Spine Complete -     Ambulatory referral to Physical Therapy  Neck pain Assessment & Plan: X-ray today for evaluation.  Orders: -     DG Cervical Spine Complete -     Ambulatory referral to Physical Therapy    Follow-up:  3-6 months  Jacqueline Collins Debrah Fan DO [facility]

## 2023-10-25 ENCOUNTER — Ambulatory Visit (HOSPITAL_COMMUNITY)

## 2023-11-01 ENCOUNTER — Emergency Department (HOSPITAL_BASED_OUTPATIENT_CLINIC_OR_DEPARTMENT_OTHER)

## 2023-11-01 ENCOUNTER — Emergency Department (HOSPITAL_BASED_OUTPATIENT_CLINIC_OR_DEPARTMENT_OTHER)
Admission: EM | Admit: 2023-11-01 | Discharge: 2023-11-01 | Disposition: A | Discharge status: Home / Self Care | Attending: Emergency Medicine | Admitting: Emergency Medicine

## 2023-11-01 ENCOUNTER — Other Ambulatory Visit: Payer: Self-pay

## 2023-11-01 ENCOUNTER — Ambulatory Visit (INDEPENDENT_AMBULATORY_CARE_PROVIDER_SITE_OTHER)

## 2023-11-01 ENCOUNTER — Other Ambulatory Visit (HOSPITAL_BASED_OUTPATIENT_CLINIC_OR_DEPARTMENT_OTHER): Payer: Self-pay

## 2023-11-01 ENCOUNTER — Encounter (HOSPITAL_BASED_OUTPATIENT_CLINIC_OR_DEPARTMENT_OTHER): Payer: Self-pay | Admitting: Emergency Medicine

## 2023-11-01 DIAGNOSIS — R102 Pelvic and perineal pain: Secondary | ICD-10-CM | POA: Diagnosis not present

## 2023-11-01 DIAGNOSIS — S20219A Contusion of unspecified front wall of thorax, initial encounter: Secondary | ICD-10-CM | POA: Insufficient documentation

## 2023-11-01 DIAGNOSIS — M62838 Other muscle spasm: Secondary | ICD-10-CM | POA: Insufficient documentation

## 2023-11-01 DIAGNOSIS — S8991XA Unspecified injury of right lower leg, initial encounter: Secondary | ICD-10-CM | POA: Diagnosis present

## 2023-11-01 DIAGNOSIS — S8002XA Contusion of left knee, initial encounter: Secondary | ICD-10-CM | POA: Insufficient documentation

## 2023-11-01 DIAGNOSIS — R519 Headache, unspecified: Secondary | ICD-10-CM | POA: Insufficient documentation

## 2023-11-01 DIAGNOSIS — Z23 Encounter for immunization: Secondary | ICD-10-CM | POA: Insufficient documentation

## 2023-11-01 DIAGNOSIS — S8001XA Contusion of right knee, initial encounter: Secondary | ICD-10-CM | POA: Insufficient documentation

## 2023-11-01 DIAGNOSIS — Y92410 Unspecified street and highway as the place of occurrence of the external cause: Secondary | ICD-10-CM | POA: Diagnosis not present

## 2023-11-01 DIAGNOSIS — M542 Cervicalgia: Secondary | ICD-10-CM | POA: Insufficient documentation

## 2023-11-01 LAB — COMPREHENSIVE METABOLIC PANEL WITH GFR
ALT: 6 U/L (ref 0–44)
AST: 15 U/L (ref 15–41)
Albumin: 4 g/dL (ref 3.5–5.0)
Alkaline Phosphatase: 62 U/L (ref 38–126)
Anion gap: 12 (ref 5–15)
BUN: 8 mg/dL (ref 6–20)
CO2: 23 mmol/L (ref 22–32)
Calcium: 9.6 mg/dL (ref 8.9–10.3)
Chloride: 105 mmol/L (ref 98–111)
Creatinine, Ser: 0.9 mg/dL (ref 0.44–1.00)
GFR, Estimated: >60 mL/min (ref >60)
Glucose, Bld: 93 mg/dL (ref 70–99)
Potassium: 3.5 mmol/L (ref 3.5–5.1)
Sodium: 140 mmol/L (ref 135–145)
Total Bilirubin: 0.8 mg/dL (ref 0.0–1.2)
Total Protein: 6.2 g/dL — ABNORMAL LOW (ref 6.5–8.1)

## 2023-11-01 LAB — CBC
HCT: 38.3 % (ref 36.0–46.0)
Hemoglobin: 13.1 g/dL (ref 12.0–15.0)
MCH: 32.5 pg (ref 26.0–34.0)
MCHC: 34.2 g/dL (ref 30.0–36.0)
MCV: 95 fL (ref 80.0–100.0)
Platelets: 355 10*3/uL (ref 150–400)
RBC: 4.03 MIL/uL (ref 3.87–5.11)
RDW: 13.2 % (ref 11.5–15.5)
WBC: 7.4 10*3/uL (ref 4.0–10.5)
nRBC: 0 % (ref 0.0–0.2)

## 2023-11-01 LAB — HCG, SERUM, QUALITATIVE: Preg, Serum: NEGATIVE

## 2023-11-01 MED ORDER — CYCLOBENZAPRINE HCL 10 MG PO TABS
10.0000 mg | ORAL_TABLET | Freq: Two times a day (BID) | ORAL | 0 refills | Status: AC | PRN
  Filled 2023-11-01: qty 20, 10d supply, fill #0

## 2023-11-01 MED ORDER — IOHEXOL 300 MG/ML  SOLN
100.0000 mL | Freq: Once | INTRAMUSCULAR | Status: AC | PRN
  Administered 2023-11-01: 100 mL via INTRAVENOUS

## 2023-11-01 MED ORDER — LIDOCAINE 5 % EX PTCH
1.0000 | MEDICATED_PATCH | CUTANEOUS | 0 refills | Status: AC
  Filled 2023-11-01: qty 30, 30d supply, fill #0

## 2023-11-01 MED ORDER — KETOROLAC TROMETHAMINE 15 MG/ML IJ SOLN
15.0000 mg | Freq: Once | INTRAMUSCULAR | Status: AC
Start: 2023-11-01 — End: 2023-11-01
  Administered 2023-11-01: 15 mg via INTRAVENOUS
  Filled 2023-11-01: qty 1

## 2023-11-01 MED ORDER — FENTANYL CITRATE PF 50 MCG/ML IJ SOSY
50.0000 ug | PREFILLED_SYRINGE | Freq: Once | INTRAMUSCULAR | Status: AC
  Administered 2023-11-01: 50 ug via INTRAVENOUS
  Filled 2023-11-01: qty 1

## 2023-11-01 MED ORDER — TETANUS-DIPHTH-ACELL PERTUSSIS 5-2.5-18.5 LF-MCG/0.5 IM SUSY
0.5000 mL | PREFILLED_SYRINGE | Freq: Once | INTRAMUSCULAR | Status: AC
  Administered 2023-11-01: 0.5 mL via INTRAMUSCULAR
  Filled 2023-11-01: qty 0.5
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
## 2023-11-01 NOTE — ED Notes (Signed)
 Manual BP performed before I arrived.Jacqueline AasAaron Collins

## 2023-11-01 NOTE — Discharge Instructions (Signed)
 Recommend 650 mg of Tylenol  every 6 hours as needed for pain.  Use lidocaine  patches as prescribed and you can place this over area of discomfort.  I have prescribed you a muscle relaxant called Flexeril .  This medication is sedating.  Do not mix with alcohol drugs or dangerous activities including driving.  Follow-up with your primary care doctor.  Recommend bacitracin or Neosporin ointment twice daily to your wound on your left knee.  Use Ace wrap for comfort as needed.
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
## 2023-11-01 NOTE — ED Notes (Signed)
 Discharge paperwork given and verbally understood.

## 2023-11-05 ENCOUNTER — Encounter: Attending: General Surgery | Admitting: Dietician

## 2023-11-05 ENCOUNTER — Other Ambulatory Visit (HOSPITAL_BASED_OUTPATIENT_CLINIC_OR_DEPARTMENT_OTHER): Payer: Self-pay

## 2023-11-05 ENCOUNTER — Encounter: Payer: Self-pay | Admitting: Dietician

## 2023-11-05 ENCOUNTER — Telehealth: Payer: Self-pay | Admitting: *Deleted

## 2023-11-05 VITALS — Wt 269.0 lb

## 2023-11-05 DIAGNOSIS — E669 Obesity, unspecified: Secondary | ICD-10-CM | POA: Insufficient documentation

## 2023-11-05 NOTE — Telephone Encounter (Signed)
 Left message to return call
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
## 2023-11-05 NOTE — Telephone Encounter (Signed)
 Cook, Jayce G, DO     Has tramadol  prescribed.
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
## 2023-11-05 NOTE — Telephone Encounter (Signed)
 Patient stated thy gave her antiinflammatory for pain and she can not take those meds due to gastric sleeve and needs something for pain to get to appt tomorrow

## 2023-11-05 NOTE — Telephone Encounter (Signed)
 Cook, Jayce G, DO     What questions does she have?

## 2023-11-06 ENCOUNTER — Telehealth (INDEPENDENT_AMBULATORY_CARE_PROVIDER_SITE_OTHER): Admitting: Family Medicine

## 2023-11-06 DIAGNOSIS — M7918 Myalgia, other site: Secondary | ICD-10-CM

## 2023-11-06 NOTE — Telephone Encounter (Signed)
 Unable to reach patient by phone- patient has office visit scheduled this afternoon with Dr Debrah Fan
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
## 2023-11-21 ENCOUNTER — Telehealth: Payer: Self-pay | Admitting: *Deleted

## 2023-11-21 NOTE — Telephone Encounter (Signed)
 Copied from CRM [id number]. Topic: General - Other >> Nov 21, 2023  4:32 PM Santiya F wrote: Reason for CRM: Patient is calling in because she needs her provider to fax over some paperwork that say she is temporarily out of work and sign it and send it over to [employer] Fax: [phone]

## 2023-11-22 ENCOUNTER — Other Ambulatory Visit: Payer: Self-pay

## 2023-11-22 ENCOUNTER — Other Ambulatory Visit: Payer: Self-pay | Admitting: Family Medicine

## 2023-11-22 DIAGNOSIS — J329 Chronic sinusitis, unspecified: Secondary | ICD-10-CM

## 2023-11-22 MED ORDER — MONTELUKAST SODIUM 10 MG PO TABS: 10.0000 mg | ORAL_TABLET | Freq: Every day | ORAL | 0 refills | Status: DC

## 2023-11-23 ENCOUNTER — Ambulatory Visit (HOSPITAL_COMMUNITY)

## 2023-11-23 ENCOUNTER — Telehealth: Payer: Self-pay | Admitting: *Deleted

## 2023-11-23 NOTE — Telephone Encounter (Unsigned)
 Copied from CRM [id number]. Topic: Medical Record Request - Other >> Nov 23, 2023 10:09 AM Hobson Luna F wrote: Reason for CRM: Lambert Pillion with social services is calling in wanting to verify if this patient is still unable to work.

## 2023-11-27 ENCOUNTER — Telehealth: Payer: Self-pay

## 2023-11-27 NOTE — Telephone Encounter (Signed)
 Cook, Jayce G, DO     She is not currently working. I have filled out forms for this previously.

## 2023-11-27 NOTE — Telephone Encounter (Signed)
 Copied from CRM [id number]. Topic: General - Other >> Nov 21, 2023  4:32 PM Santiya F wrote: Reason for CRM: Patient is calling in because she needs her provider to fax over some paperwork that say she is temporarily out of work and sign it and send it over to [employer] Fax: [phone]

## 2023-11-30 ENCOUNTER — Encounter: Payer: Self-pay | Admitting: Plastic Surgery

## 2023-11-30 ENCOUNTER — Ambulatory Visit: Admitting: Plastic Surgery

## 2023-11-30 VITALS — BP 102/61 | HR 75 | Ht 62.0 in | Wt 259.6 lb

## 2023-11-30 DIAGNOSIS — M542 Cervicalgia: Secondary | ICD-10-CM | POA: Diagnosis not present

## 2023-11-30 DIAGNOSIS — N62 Hypertrophy of breast: Secondary | ICD-10-CM

## 2023-11-30 DIAGNOSIS — F418 Other specified anxiety disorders: Secondary | ICD-10-CM | POA: Diagnosis not present

## 2023-11-30 DIAGNOSIS — G8929 Other chronic pain: Secondary | ICD-10-CM

## 2023-11-30 DIAGNOSIS — Z6841 Body Mass Index (BMI) 40.0 and over, adult: Secondary | ICD-10-CM

## 2023-11-30 DIAGNOSIS — Z803 Family history of malignant neoplasm of breast: Secondary | ICD-10-CM

## 2023-11-30 DIAGNOSIS — G43909 Migraine, unspecified, not intractable, without status migrainosus: Secondary | ICD-10-CM
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
## 2023-12-04 NOTE — Telephone Encounter (Signed)
 See other messages concerning this
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
## 2023-12-26 ENCOUNTER — Encounter (HOSPITAL_COMMUNITY): Payer: Self-pay

## 2023-12-26 ENCOUNTER — Ambulatory Visit (HOSPITAL_COMMUNITY): Attending: Family Medicine

## 2023-12-26 ENCOUNTER — Telehealth (HOSPITAL_COMMUNITY): Payer: Self-pay

## 2023-12-26 ENCOUNTER — Other Ambulatory Visit: Payer: Self-pay
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
## 2023-12-27 ENCOUNTER — Telehealth: Payer: Self-pay

## 2023-12-27 NOTE — Telephone Encounter (Signed)
 Communication  Did the patient discuss referral with their provider in the last year? Yes, was referred to physical therapy, was just seen there, that office recommended the patient get home health physical therapy.        Appointment offered? No        Type of order/referral and detailed reason for visit: Home Health Physical Therapy        Preference of office, provider, location: N/A        If referral order, have you been seen by this specialty before? No    (If Yes, this issue or another issue? When? Where?    N/A        Can we respond through MyChart? Both, either

## 2024-02-11 ENCOUNTER — Other Ambulatory Visit: Payer: Self-pay | Admitting: Family Medicine

## 2024-02-11 NOTE — Telephone Encounter (Signed)
 Copied from CRM #[id number]. Topic: General - Other >> Feb 11, 2024 12:45 PM DeAngela L wrote: Reason for CRM: patient calling to ask about social work provide resources, this was a suggested by Dr Bluford after patient was having pt transportation question, she would like the office to Provide her with the social work resources so she can call and get additional information Pt num [phone] (M)

## 2024-02-11 NOTE — Telephone Encounter (Signed)
 Dr Bluford is with [facility] Thank you

## 2024-02-12 ENCOUNTER — Other Ambulatory Visit: Payer: Self-pay | Admitting: Family Medicine

## 2024-02-12 ENCOUNTER — Telehealth: Payer: Self-pay

## 2024-02-12 DIAGNOSIS — Z5982 Transportation insecurity: Secondary | ICD-10-CM

## 2024-02-12 NOTE — Telephone Encounter (Signed)
 Reason for CRM: patient calling to ask about social work provide resources, this was a suggested by Dr Bluford after patient was having pt transportation question, she would like the office to Provide her with the social work resources so she can call and get additional information Pt num [phone] (M)

## 2024-02-16 ENCOUNTER — Other Ambulatory Visit: Payer: Self-pay | Admitting: Family Medicine

## 2024-02-16 DIAGNOSIS — J329 Chronic sinusitis, unspecified: Secondary | ICD-10-CM

## 2024-02-18 ENCOUNTER — Other Ambulatory Visit: Payer: Self-pay

## 2024-02-18 DIAGNOSIS — J329 Chronic sinusitis, unspecified: Secondary | ICD-10-CM

## 2024-02-18 MED ORDER — MONTELUKAST SODIUM 10 MG PO TABS
10.0000 mg | ORAL_TABLET | Freq: Every day | ORAL | 0 refills | Status: DC
Start: 2024-02-18 — End: 2024-04-21

## 2024-02-29 ENCOUNTER — Ambulatory Visit: Admitting: Student

## 2024-03-13 ENCOUNTER — Ambulatory Visit: Admitting: Student

## 2024-03-26 ENCOUNTER — Telehealth: Payer: Self-pay

## 2024-03-26 NOTE — Progress Notes (Signed)
 Complex Care Management Note Care Guide Note  03/26/2024 Name: Jacqueline Collins MRN: [id number] DOB: 1986-06-20   Complex Care Management Outreach Attempts: An unsuccessful telephone outreach was attempted today to offer the patient information about available complex care management services.  Follow Up Plan:  Additional outreach attempts will be made to offer the patient complex care management information and services.   Encounter Outcome:  No Answer  Jeoffrey Buffalo , RMA     [place]  [facility], [facility] Guide  Direct Dial: [phone]  Website: [email or web address]

## 2024-03-28 NOTE — Progress Notes (Signed)
 Complex Care Management Note Care Guide Note  03/28/2024 Name: Jacqueline Collins MRN: [id number] DOB: Dec 27, 1985   Complex Care Management Outreach Attempts: A second unsuccessful outreach was attempted today to offer the patient with information about available complex care management services.  Follow Up Plan:  Additional outreach attempts will be made to offer the patient complex care management information and services.   Encounter Outcome:  No Answer  Jeoffrey Buffalo , RMA     [place]  [facility], [facility] Guide  Direct Dial: [phone]  Website: [email or web address]

## 2024-04-01 NOTE — Progress Notes (Signed)
 Complex Care Management Note  Care Guide Note 04/01/2024 Name: BECKHAM BUXBAUM MRN: [id number] DOB: [birth year]  Marjorie CINDERELLA Hamilton is a 38 y.o. year old female who sees Cook, Jayce G, DO for primary care. I reached out to Marjorie CINDERELLA Hamilton by phone today to offer complex care management services.  Ms. Coia was given information about Complex Care Management services today including:   The Complex Care Management services include support from the care team which includes your Nurse Care Manager, Clinical Social Worker, or Pharmacist.  The Complex Care Management team is here to help remove barriers to the health concerns and goals most important to you. Complex Care Management services are voluntary, and the patient may decline or stop services at any time by request to their care team member.   Complex Care Management Consent Status: Patient agreed to services and verbal consent obtained.   Follow up plan:  Telephone appointment with complex care management team member scheduled for:  04/04/2024  Encounter Outcome:  Patient Scheduled  Jeoffrey Buffalo , RMA     [place]  [facility], [facility] Guide  Direct Dial: [phone]  Website: [email or web address]

## 2024-04-04 ENCOUNTER — Other Ambulatory Visit: Admitting: Licensed Clinical Social Worker

## 2024-04-04 NOTE — Patient Instructions (Signed)
 Visit Information  Thank you for taking time to visit with me today. Please don't hesitate to contact me if I can be of assistance to you before our next scheduled appointment.  Our next appointment is by telephone on 04/10/24 at 11 am Please call the care guide team at [phone] if you need to cancel or reschedule your appointment.   Following is a copy of your care plan:   Goals Addressed             This Visit's Progress    VBCI Social Work Care Plan       Problems:   Care Coordination needs related to SDOH needs CSW Clinical Goal(s):   Over the next 30 days the patient/family will decrease symptoms of financial and emotional strain by enhancing self-care, promoting social interaction, managing chronic diseases and utilizing community resources.   Interventions:   Social Determinants of Health in Patient with Anxiety and Depression SDOH assessments completed: Depression  , Financial Strain , Food Insecurity , Housing , Stress, and Transportation Evaluation of current treatment plan related to unmet needs Referral placed to VBCI BSW for transportation resources Findhelp referral placed for food assistance Pt denied need for [facility] support at this time  Patient Goals/Self-Care Activities: Increase self-care, Take Medications As Prescribed, Attend all Medical Appointments    Plan: VBCI LCSW will follow up within one month          Please call the Suicide and Crisis Lifeline: 988 call the [place]  National Suicide Prevention Lifeline: (647) 825-8248 or TTY: 585-093-7490 TTY 430-452-9029) to talk to a trained counselor call 1-800-273-TALK (toll free, 24 hour hotline) go to [facility] [address] [place] [phone]) call the [facility] Crisis Line: 228-002-3255 call 911 if you are experiencing a Mental Health or Behavioral Health Crisis or need someone to talk to.  Patient verbalizes understanding of instructions and  care plan provided today and agrees to view in MyChart. Active MyChart status and patient understanding of how to access instructions and care plan via MyChart confirmed with patient.     Lyle Rung, BSW, MSW, LCSW Licensed Clinical Social Worker [employer]   [facility] [email or web address] Direct Dial: [phone]
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
## 2024-04-10 ENCOUNTER — Other Ambulatory Visit: Payer: Self-pay

## 2024-04-10 NOTE — Patient Instructions (Signed)
 Visit Information  Thank you for taking time to visit with me today. Please don't hesitate to contact me if I can be of assistance to you before our next scheduled appointment.  Your next care management appointment is by telephone on 04/17/24 at 11am   Please call the care guide team at [phone] if you need to cancel, schedule, or reschedule an appointment.   Please call 911 if you are experiencing a Mental Health or Behavioral Health Crisis or need someone to talk to.  Tillman Gardener, BSW [place]  [facility], [facility] Social Worker Direct Dial: [phone]  Fax: [phone] Website: [email or web address]
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
## 2024-04-11 ENCOUNTER — Other Ambulatory Visit: Payer: Self-pay | Admitting: Family Medicine

## 2024-04-11 ENCOUNTER — Telehealth: Payer: Self-pay

## 2024-04-11 NOTE — Telephone Encounter (Signed)
 Called Patient left voicemail to come pick up document 04/11/2024    Cook, Jayce G, DO  Gladis Beckers Letter given to front staff.       Previous Messages    ----- Message ----- From: Carolynn Tillman NOVAK Sent: 04/10/2024   2:57 PM EDT To: Jayce G Cook, DO  Dr. Bluford, I spoke with the patient today and she is requesting a letter to submit with her foodstamp application to explain her disability and that she can not work at this time.  Patient agreed to pick up the letter when it is ready.  Please let me know if you have any questions.   Tillman Carolynn, BSW [place]  [facility], [facility] Social Worker Direct Dial: [phone]  Fax: [phone]

## 2024-04-15 ENCOUNTER — Encounter: Payer: Self-pay | Admitting: Student

## 2024-04-15 ENCOUNTER — Ambulatory Visit: Admitting: Student

## 2024-04-15 VITALS — BP 113/69 | HR 60 | Ht 62.0 in | Wt 239.0 lb

## 2024-04-15 DIAGNOSIS — F17290 Nicotine dependence, other tobacco product, uncomplicated: Secondary | ICD-10-CM | POA: Diagnosis not present

## 2024-04-15 DIAGNOSIS — N62 Hypertrophy of breast: Secondary | ICD-10-CM | POA: Diagnosis not present

## 2024-04-15 DIAGNOSIS — Z6841 Body Mass Index (BMI) 40.0 and over, adult: Secondary | ICD-10-CM | POA: Diagnosis not present
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
## 2024-04-16 ENCOUNTER — Telehealth: Payer: Self-pay

## 2024-04-16 NOTE — Telephone Encounter (Signed)
 Copied from CRM [id number]. Topic: General - Other >> Apr 16, 2024 10:01 AM Travis F wrote: Reason for CRM: Grayce Brittle with Social Services is calling in for clarification on a doctor's note she received from patient. She says the note doesn't specify if patient is unable to work. Please follow up with Sonya. [phone] ext [phone]

## 2024-04-17 ENCOUNTER — Telehealth: Payer: Self-pay

## 2024-04-17 NOTE — Patient Instructions (Signed)
 Jacqueline Collins - I am sorry I was unable to reach you today for our scheduled appointment. I work with Cook, Jayce G, DO and am calling to support your healthcare needs. Please contact me at [phone] at your earliest convenience. I look forward to speaking with you soon.   Thank you,  Tillman Gardener, BSW [place]  [facility], [facility] Social Worker Direct Dial: [phone]  Fax: [phone] Website: [email or web address]

## 2024-04-17 NOTE — Telephone Encounter (Signed)
 Cook, Jayce G, DO      04/16/24  2:06 PM I feel that she could do some sort of work, but I do not determine disability (that is a legal issue). She has been out of work for quite some time

## 2024-04-17 NOTE — Telephone Encounter (Signed)
 Dr Bluford spoke with case worker

## 2024-04-18 ENCOUNTER — Other Ambulatory Visit: Payer: Self-pay | Admitting: Family Medicine

## 2024-04-18 ENCOUNTER — Other Ambulatory Visit: Payer: Self-pay | Admitting: Internal Medicine

## 2024-04-18 DIAGNOSIS — J329 Chronic sinusitis, unspecified: Secondary | ICD-10-CM

## 2024-04-21 ENCOUNTER — Telehealth: Payer: Self-pay

## 2024-04-21 NOTE — Patient Instructions (Signed)
 Jacqueline Collins - I am sorry I was unable to reach you today for our scheduled appointment. I work with Cook, Jayce G, DO and am calling to support your healthcare needs. Please contact me at [phone] at your earliest convenience. I look forward to speaking with you soon.   Thank you,  Tillman Gardener, BSW [place]  [facility], [facility] Social Worker Direct Dial: [phone]  Fax: [phone] Website: [email or web address]

## 2024-04-22 ENCOUNTER — Other Ambulatory Visit: Payer: Self-pay

## 2024-04-22 ENCOUNTER — Telehealth: Payer: Self-pay | Admitting: Family Medicine

## 2024-04-22 DIAGNOSIS — R202 Paresthesia of skin: Secondary | ICD-10-CM

## 2024-04-22 DIAGNOSIS — G8929 Other chronic pain: Secondary | ICD-10-CM | POA: Diagnosis not present

## 2024-04-22 DIAGNOSIS — M542 Cervicalgia: Secondary | ICD-10-CM | POA: Diagnosis not present

## 2024-04-22 DIAGNOSIS — M5440 Lumbago with sciatica, unspecified side: Secondary | ICD-10-CM

## 2024-04-22 MED ORDER — UBRELVY 100 MG PO TABS: 100.0000 mg | ORAL_TABLET | Freq: Every day | ORAL | 11 refills | Status: AC | PRN

## 2024-04-22 NOTE — Telephone Encounter (Signed)
 Sent to pharmacy

## 2024-04-22 NOTE — Telephone Encounter (Signed)
 Refill on Ubrogepant  (UBRELVY ) 100 MG TABS   [facility]

## 2024-04-23 NOTE — Progress Notes (Signed)
 Virtual Visit via Video Note  I connected with Jacqueline Collins on 04/23/24 at  2:30 PM EDT by a video enabled telemedicine application and verified that I am speaking with the correct person using two identifiers.  Location: Patient: Home Provider: Office   I discussed the limitations of evaluation and management by telemedicine and the availability of in person appointments. The patient expressed understanding and agreed to proceed.  History of Present Illness: 38 year old female with morbid obesity status post laparoscopic sleeve gastrectomy, hyperlipidemia, chronic pain, anxiety and depression, asthma, OSA, migraine presents for follow-up.  Patient states that transportation has been an issue.  She has been unable to go to physical therapy and attend appointments due to lack of transportation.  Patient wanted physical therapy to be done at home however, there is no medical indication that she is homebound and therefore physical therapy cannot be done at home.  Patient reports that she continues to have ongoing issues with pain.  She is having pain in the lower extremities is also having some numbness and tingling in the upper and lower extremities.  She states that she needs an MRI.  She is interested in seeing a specialist regarding her pain and numbness.   Observations/Objective: General: Well-appearing female no acute distress.  Assessment and Plan: 38 year old female with the above-mentioned medical problems presents for follow-up.  Patient needs to be seen in person.  Patient requested referral in regards to numbness/paresthesias as well as pain.  Placing referral to neurology for further evaluation.  Follow Up Instructions:    I discussed the assessment and treatment plan with the patient. The patient was provided an opportunity to ask questions and all were answered. The patient agreed with the plan and demonstrated an understanding of the instructions.   The patient was  advised to call back or seek an in-person evaluation if the symptoms worsen or if the condition fails to improve as anticipated.  I provided 5 minutes of non-face-to-face time during this encounter.   Alyzae Hawkey G Johnattan Strassman, DO

## 2024-04-24 ENCOUNTER — Telehealth: Payer: Self-pay

## 2024-04-24 NOTE — Patient Instructions (Signed)
 Jacqueline Collins - I have attempted to call you three times but have been unsuccessful in reaching you. I work with Cook, Jayce G, DO and am calling to support your healthcare needs. If I can be of assistance to you, please contact me at [phone].     Thank you,  Tillman Gardener, BSW [place]  [facility], [facility] Social Worker Direct Dial: [phone]  Fax: [phone] Website: [email or web address]

## 2024-04-25 ENCOUNTER — Telehealth: Payer: Self-pay | Admitting: Licensed Clinical Social Worker

## 2024-05-22 ENCOUNTER — Other Ambulatory Visit: Payer: Self-pay | Admitting: Family Medicine

## 2024-06-03 ENCOUNTER — Encounter: Admitting: Women's Health

## 2024-06-18 ENCOUNTER — Other Ambulatory Visit: Admitting: Licensed Clinical Social Worker

## 2024-06-18 DIAGNOSIS — R198 Other specified symptoms and signs involving the digestive system and abdomen: Secondary | ICD-10-CM

## 2024-06-18 NOTE — Patient Instructions (Signed)
 Visit Information  Thank you for taking time to visit with me today. Please don't hesitate to contact me if I can be of assistance to you in the future!  Following is a copy of your care plan:   Goals Addressed             This Visit's Progress    VBCI Social Work Care Plan       Problems:   Care Coordination needs related to SDOH needs CSW Clinical Goal(s):   Over the next 30 days the patient/family will decrease symptoms of financial and emotional strain by enhancing self-care, promoting social interaction, managing chronic diseases and utilizing community resources.   Interventions:   Social Determinants of Health in Patient with Anxiety and Depression SDOH assessments completed: Depression  , Financial Strain , Geophysicist/field Seismologist , Housing , Stress, and Transportation Evaluation of current treatment plan related to unmet needs Past referral placed to VBCI BSW for transportation resources Findhelp referral placed for food assistance Pt denied need for [facility] support at this time  Patient Goals/Self-Care Activities: Increase self-care, Take Medications As Prescribed, Attend all Medical Appointments    Plan: Patient denies need for services and does not wish to have VBCI follow up        Please call the Suicide and Crisis Lifeline: 988 call the [place]  National Suicide Prevention Lifeline: 402-544-9310 or TTY: 208-376-8448 TTY (551)677-2018) to talk to a trained counselor call 1-800-273-TALK (toll free, 24 hour hotline) go to [facility] [address], [place] [phone]) call the [facility] Crisis Line: (905)116-1100 call 911 if you are experiencing a Mental Health or Behavioral Health Crisis or need someone to talk to.  Patient verbalized understanding of Care plan and visit instructions communicated this visit  Lyle Rung, BSW, MSW, LCSW Licensed Clinical Social Worker [employer]   [facility] [email or web address] Direct Dial: [phone]
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
## 2024-06-23 ENCOUNTER — Telehealth: Admitting: Physician Assistant

## 2024-06-23 DIAGNOSIS — R198 Other specified symptoms and signs involving the digestive system and abdomen: Secondary | ICD-10-CM

## 2024-06-23 NOTE — Progress Notes (Signed)
" °  Because of back pain and painful bowel movements with other symptoms, I feel your condition warrants further evaluation and I recommend that you be seen in a face-to-face visit.   NOTE: There will be NO CHARGE for this E-Visit   If you are having a true medical emergency, please call 911.     For an urgent face to face visit, [facility] has multiple urgent care centers for your convenience.  Click the link below for the full list of locations and hours, walk-in wait times, appointment scheduling options and driving directions:  Urgent Care - [place], [place], [place], [place], [place], [place]  [name]     Your MyChart E-visit questionnaire answers were reviewed by a board certified advanced clinical practitioner to complete your personal care plan based on your specific symptoms.    Thank you for using e-Visits.    "

## 2024-07-15 ENCOUNTER — Encounter: Admitting: Women's Health

## 2024-07-23 ENCOUNTER — Encounter (HOSPITAL_COMMUNITY): Payer: Self-pay | Admitting: *Deleted

## 2024-10-17 ENCOUNTER — Ambulatory Visit: Admitting: Plastic Surgery
